# Patient Record
Sex: Male | Born: 1940 | Race: White | Hispanic: No | Marital: Married | Smoking: Never smoker
Health system: Southern US, Community
[De-identification: ages and names within clinical notes are randomized; demographics above are authoritative.]

## PROBLEM LIST (undated history)

## (undated) DIAGNOSIS — E785 Hyperlipidemia, unspecified: Secondary | ICD-10-CM

## (undated) DIAGNOSIS — M1990 Unspecified osteoarthritis, unspecified site: Secondary | ICD-10-CM

## (undated) DIAGNOSIS — I10 Essential (primary) hypertension: Secondary | ICD-10-CM

## (undated) DIAGNOSIS — G7000 Myasthenia gravis without (acute) exacerbation: Secondary | ICD-10-CM

## (undated) HISTORY — PX: APPENDECTOMY: SHX54

## (undated) HISTORY — PX: EYE SURGERY: SHX253

## (undated) HISTORY — DX: Myasthenia gravis without (acute) exacerbation: G70.00

## (undated) HISTORY — PX: HERNIA REPAIR: SHX51

---

## 2005-01-20 ENCOUNTER — Ambulatory Visit: Payer: Self-pay | Admitting: Ophthalmology

## 2005-11-07 ENCOUNTER — Other Ambulatory Visit: Payer: Self-pay

## 2005-11-13 ENCOUNTER — Ambulatory Visit: Payer: Self-pay | Admitting: General Surgery

## 2006-08-19 ENCOUNTER — Ambulatory Visit: Payer: Self-pay | Admitting: Cardiology

## 2007-08-17 ENCOUNTER — Emergency Department: Payer: Self-pay | Admitting: Emergency Medicine

## 2007-08-26 ENCOUNTER — Emergency Department: Payer: Self-pay | Admitting: Internal Medicine

## 2009-01-15 HISTORY — PX: COLONOSCOPY: SHX174

## 2009-02-01 ENCOUNTER — Ambulatory Visit: Payer: Self-pay | Admitting: General Surgery

## 2009-03-07 ENCOUNTER — Ambulatory Visit: Payer: Self-pay | Admitting: Ophthalmology

## 2009-03-19 ENCOUNTER — Ambulatory Visit: Payer: Self-pay | Admitting: Ophthalmology

## 2013-05-13 ENCOUNTER — Ambulatory Visit: Payer: Self-pay | Admitting: Ophthalmology

## 2013-05-13 LAB — CREATININE, SERUM
Creatinine: 1.03 mg/dL (ref 0.60–1.30)
EGFR (African American): >60
EGFR (Non-African Amer.): >60

## 2013-10-04 ENCOUNTER — Ambulatory Visit: Payer: Self-pay | Admitting: Family Medicine

## 2014-06-30 ENCOUNTER — Other Ambulatory Visit: Payer: Self-pay | Admitting: Neurology

## 2014-06-30 DIAGNOSIS — R519 Headache, unspecified: Secondary | ICD-10-CM

## 2014-06-30 DIAGNOSIS — R51 Headache: Principal | ICD-10-CM

## 2014-07-05 ENCOUNTER — Ambulatory Visit
Admission: RE | Admit: 2014-07-05 | Discharge: 2014-07-05 | Disposition: A | Discharge status: Home / Self Care | Payer: 59 | Source: Ambulatory Visit | Attending: Neurology | Admitting: Neurology

## 2014-07-05 DIAGNOSIS — R519 Headache, unspecified: Secondary | ICD-10-CM

## 2014-07-05 DIAGNOSIS — R51 Headache: Principal | ICD-10-CM

## 2014-07-05 MED ORDER — GADOBENATE DIMEGLUMINE 529 MG/ML IV SOLN
17.0000 mL | Freq: Once | INTRAVENOUS | Status: AC | PRN
  Administered 2014-07-05: 17 mL via INTRAVENOUS

## 2015-04-11 ENCOUNTER — Ambulatory Visit
Admit: 2015-04-11 | Disposition: A | Discharge status: Home / Self Care | Payer: Self-pay | Attending: Family Medicine | Admitting: Family Medicine

## 2015-04-14 ENCOUNTER — Ambulatory Visit
Admit: 2015-04-14 | Disposition: A | Discharge status: Home / Self Care | Payer: Self-pay | Attending: Family Medicine | Admitting: Family Medicine

## 2015-07-05 ENCOUNTER — Inpatient Hospital Stay
Admission: EM | Admit: 2015-07-05 | Discharge: 2015-07-09 | DRG: 470 | Disposition: A | Discharge status: Skilled Nursing Facility | Payer: Medicare Other | Attending: Specialist | Admitting: Specialist

## 2015-07-05 ENCOUNTER — Emergency Department: Payer: Medicare Other

## 2015-07-05 ENCOUNTER — Encounter: Payer: Self-pay | Admitting: Emergency Medicine

## 2015-07-05 DIAGNOSIS — Z96641 Presence of right artificial hip joint: Secondary | ICD-10-CM

## 2015-07-05 DIAGNOSIS — Z8249 Family history of ischemic heart disease and other diseases of the circulatory system: Secondary | ICD-10-CM | POA: Diagnosis not present

## 2015-07-05 DIAGNOSIS — Y93H3 Activity, building and construction: Secondary | ICD-10-CM | POA: Diagnosis not present

## 2015-07-05 DIAGNOSIS — Y92009 Unspecified place in unspecified non-institutional (private) residence as the place of occurrence of the external cause: Secondary | ICD-10-CM | POA: Diagnosis not present

## 2015-07-05 DIAGNOSIS — E785 Hyperlipidemia, unspecified: Secondary | ICD-10-CM | POA: Diagnosis present

## 2015-07-05 DIAGNOSIS — S92021A Displaced fracture of anterior process of right calcaneus, initial encounter for closed fracture: Secondary | ICD-10-CM | POA: Diagnosis present

## 2015-07-05 DIAGNOSIS — S92001A Unspecified fracture of right calcaneus, initial encounter for closed fracture: Secondary | ICD-10-CM

## 2015-07-05 DIAGNOSIS — G7000 Myasthenia gravis without (acute) exacerbation: Secondary | ICD-10-CM | POA: Diagnosis present

## 2015-07-05 DIAGNOSIS — Z833 Family history of diabetes mellitus: Secondary | ICD-10-CM | POA: Diagnosis not present

## 2015-07-05 DIAGNOSIS — I10 Essential (primary) hypertension: Secondary | ICD-10-CM | POA: Diagnosis present

## 2015-07-05 DIAGNOSIS — D696 Thrombocytopenia, unspecified: Secondary | ICD-10-CM | POA: Diagnosis present

## 2015-07-05 DIAGNOSIS — D62 Acute posthemorrhagic anemia: Secondary | ICD-10-CM | POA: Diagnosis not present

## 2015-07-05 DIAGNOSIS — W11XXXA Fall on and from ladder, initial encounter: Secondary | ICD-10-CM | POA: Diagnosis present

## 2015-07-05 DIAGNOSIS — S72001A Fracture of unspecified part of neck of right femur, initial encounter for closed fracture: Principal | ICD-10-CM | POA: Diagnosis present

## 2015-07-05 HISTORY — DX: Hyperlipidemia, unspecified: E78.5

## 2015-07-05 HISTORY — DX: Essential (primary) hypertension: I10

## 2015-07-05 LAB — COMPREHENSIVE METABOLIC PANEL
ALT: 16 U/L — ABNORMAL LOW (ref 17–63)
ANION GAP: 7 (ref 5–15)
AST: 26 U/L (ref 15–41)
Albumin: 3.8 g/dL (ref 3.5–5.0)
Alkaline Phosphatase: 33 U/L — ABNORMAL LOW (ref 38–126)
BILIRUBIN TOTAL: 0.5 mg/dL (ref 0.3–1.2)
BUN: 20 mg/dL (ref 6–20)
CHLORIDE: 109 mmol/L (ref 101–111)
CO2: 25 mmol/L (ref 22–32)
Calcium: 9.3 mg/dL (ref 8.9–10.3)
Creatinine, Ser: 1.17 mg/dL (ref 0.61–1.24)
GFR calc Af Amer: >60 mL/min (ref >60)
GFR calc non Af Amer: >60 mL/min (ref >60)
Glucose, Bld: 115 mg/dL — ABNORMAL HIGH (ref 65–99)
Potassium: 3.7 mmol/L (ref 3.5–5.1)
Sodium: 141 mmol/L (ref 135–145)
Total Protein: 6.2 g/dL — ABNORMAL LOW (ref 6.5–8.1)

## 2015-07-05 LAB — CBC WITH DIFFERENTIAL/PLATELET
BASOS ABS: 0.1 10*3/uL (ref 0–0.1)
Basophils Relative: 1 %
EOS ABS: 0.1 10*3/uL (ref 0–0.7)
Eosinophils Relative: 1 %
HEMATOCRIT: 37.4 % — AB (ref 40.0–52.0)
Hemoglobin: 12.7 g/dL — ABNORMAL LOW (ref 13.0–18.0)
LYMPHS ABS: 0.4 10*3/uL — AB (ref 1.0–3.6)
LYMPHS PCT: 4 %
MCH: 33.2 pg (ref 26.0–34.0)
MCHC: 34 g/dL (ref 32.0–36.0)
MCV: 97.9 fL (ref 80.0–100.0)
MONOS PCT: 6 %
Monocytes Absolute: 0.8 10*3/uL (ref 0.2–1.0)
NEUTROS ABS: 10.7 10*3/uL — AB (ref 1.4–6.5)
Neutrophils Relative %: 88 %
PLATELETS: 142 10*3/uL — AB (ref 150–440)
RBC: 3.82 MIL/uL — AB (ref 4.40–5.90)
RDW: 14.3 % (ref 11.5–14.5)
WBC: 12.1 10*3/uL — AB (ref 3.8–10.6)

## 2015-07-05 LAB — URINALYSIS COMPLETE WITH MICROSCOPIC (ARMC ONLY)
Bilirubin Urine: NEGATIVE
Glucose, UA: NEGATIVE mg/dL
Hgb urine dipstick: NEGATIVE
KETONES UR: NEGATIVE mg/dL
LEUKOCYTES UA: NEGATIVE
Nitrite: NEGATIVE
Protein, ur: NEGATIVE mg/dL
Specific Gravity, Urine: 1.016 (ref 1.005–1.030)
Squamous Epithelial / LPF: NONE SEEN
pH: 6 (ref 5.0–8.0)

## 2015-07-05 LAB — TROPONIN I: Troponin I: <0.03 ng/mL (ref <0.031)

## 2015-07-05 MED ORDER — ONDANSETRON HCL 4 MG PO TABS: 4.0000 mg | ORAL_TABLET | Freq: Four times a day (QID) | ORAL | Status: DC | PRN

## 2015-07-05 MED ORDER — MORPHINE SULFATE 4 MG/ML IJ SOLN
4.0000 mg | Freq: Once | INTRAMUSCULAR | Status: AC
  Administered 2015-07-05: 4 mg via INTRAVENOUS

## 2015-07-05 MED ORDER — ONDANSETRON HCL 4 MG/2ML IJ SOLN: 4.0000 mg | Freq: Four times a day (QID) | INTRAMUSCULAR | Status: DC | PRN

## 2015-07-05 MED ORDER — MORPHINE SULFATE 4 MG/ML IJ SOLN
INTRAMUSCULAR | Status: AC
  Administered 2015-07-05: 4 mg via INTRAVENOUS
  Filled 2015-07-05: qty 1

## 2015-07-05 MED ORDER — ONDANSETRON HCL 4 MG/2ML IJ SOLN
INTRAMUSCULAR | Status: AC
  Administered 2015-07-05: 4 mg via INTRAVENOUS
  Filled 2015-07-05: qty 2

## 2015-07-05 MED ORDER — SENNOSIDES-DOCUSATE SODIUM 8.6-50 MG PO TABS: 1.0000 | ORAL_TABLET | Freq: Every evening | ORAL | Status: DC | PRN

## 2015-07-05 MED ORDER — SODIUM CHLORIDE 0.9 % IV SOLN
INTRAVENOUS | Status: DC
  Administered 2015-07-05 – 2015-07-06 (×2): via INTRAVENOUS

## 2015-07-05 MED ORDER — LISINOPRIL 10 MG PO TABS
10.0000 mg | ORAL_TABLET | Freq: Every day | ORAL | Status: DC
  Administered 2015-07-07 – 2015-07-09 (×3): 10 mg via ORAL
  Filled 2015-07-05 (×3): qty 1

## 2015-07-05 MED ORDER — ACETAMINOPHEN 650 MG RE SUPP: 650.0000 mg | Freq: Four times a day (QID) | RECTAL | Status: DC | PRN

## 2015-07-05 MED ORDER — ONDANSETRON HCL 4 MG/2ML IJ SOLN
4.0000 mg | Freq: Once | INTRAMUSCULAR | Status: AC
  Administered 2015-07-05: 4 mg via INTRAVENOUS

## 2015-07-05 MED ORDER — ATENOLOL 50 MG PO TABS
50.0000 mg | ORAL_TABLET | Freq: Two times a day (BID) | ORAL | Status: DC
  Administered 2015-07-05 – 2015-07-09 (×7): 50 mg via ORAL
  Filled 2015-07-05 (×8): qty 1

## 2015-07-05 MED ORDER — ALBUTEROL SULFATE (2.5 MG/3ML) 0.083% IN NEBU: 2.5000 mg | INHALATION_SOLUTION | RESPIRATORY_TRACT | Status: DC | PRN

## 2015-07-05 MED ORDER — HYDROCODONE-ACETAMINOPHEN 5-325 MG PO TABS
1.0000 | ORAL_TABLET | ORAL | Status: DC | PRN
  Administered 2015-07-05: 1 via ORAL
  Administered 2015-07-06: 2 via ORAL
  Filled 2015-07-05: qty 2
  Filled 2015-07-05 (×2): qty 1

## 2015-07-05 MED ORDER — DOCUSATE SODIUM 100 MG PO CAPS
100.0000 mg | ORAL_CAPSULE | Freq: Two times a day (BID) | ORAL | Status: DC
  Administered 2015-07-05 – 2015-07-09 (×7): 100 mg via ORAL
  Filled 2015-07-05 (×7): qty 1

## 2015-07-05 MED ORDER — ACETAMINOPHEN 325 MG PO TABS: 650.0000 mg | ORAL_TABLET | Freq: Four times a day (QID) | ORAL | Status: DC | PRN

## 2015-07-05 MED ORDER — MORPHINE SULFATE 2 MG/ML IJ SOLN
2.0000 mg | INTRAMUSCULAR | Status: DC | PRN
  Administered 2015-07-05 – 2015-07-06 (×4): 2 mg via INTRAVENOUS
  Filled 2015-07-05 (×4): qty 1

## 2015-07-05 NOTE — Progress Notes (Signed)
Dr.Poggi notified stated he will do surgery in the am 07/06/2015.

## 2015-07-05 NOTE — H&P (Signed)
[facility] [place] NAME: Nicholas Monroe    MR#:  [id number]  DATE OF BIRTH:  [birth year]  DATE OF ADMISSION:  07/05/2015  PRIMARY CARE PHYSICIAN: No primary care provider on file.   REQUESTING/REFERRING PHYSICIAN: Dr. Jimmye Norman  CHIEF COMPLAINT:   Chief Complaint  Patient presents with  . Fall   fall today  HISTORY OF PRESENT ILLNESS:  Nicholas Monroe  is a 74 y.o. male with a known history of I per tension and hyperlipidemia. 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 The right side of the body hit the ground. He complains of right hip and right ankle pain. X-ray show right hip and ankle fracture with mild displacement. Patient denies any other injury. He denies any syncope or loss of consciousness.  PAST MEDICAL HISTORY:   Past Medical History  Diagnosis Date  . Hypertension   . Hyperlipidemia     PAST SURGICAL HISTORY:   Past Surgical History  Procedure Laterality Date  . Appendectomy    . Hernia repair      SOCIAL HISTORY:   History  Substance Use Topics  . Smoking status: Never Smoker   . Smokeless tobacco: Never Used  . Alcohol Use: No    FAMILY HISTORY:   Family History  Problem Relation Age of Onset  . Hypertension Father   . Diabetes Brother     DRUG ALLERGIES:  No Known Allergies  REVIEW OF SYSTEMS:  CONSTITUTIONAL: No fever, fatigue or weakness.  EYES: No blurred or double vision.  EARS, NOSE, AND THROAT: No tinnitus or ear pain.  RESPIRATORY: No cough, shortness of breath, wheezing or hemoptysis.  CARDIOVASCULAR: No chest pain, orthopnea, edema.  GASTROINTESTINAL: No nausea, vomiting, diarrhea or abdominal pain.  GENITOURINARY: No dysuria, hematuria.  ENDOCRINE: No polyuria, nocturia,  HEMATOLOGY: No anemia, easy bruising or bleeding SKIN: No rash or lesion. MUSCULOSKELETAL: No joint pain or arthritis.  Right hip and ankle pain. NEUROLOGIC: No tingling,  numbness, weakness.  PSYCHIATRY: No anxiety or depression.   MEDICATIONS AT HOME:   Prior to Admission medications   Medication Sig Start Date End Date Taking? Authorizing Provider  aspirin EC 81 MG tablet Take 81 mg by mouth daily.   Yes Historical Provider, MD  atenolol (TENORMIN) 50 MG tablet Take 50 mg by mouth 2 (two) times daily.   Yes Historical Provider, MD  fenofibrate (TRICOR) 145 MG tablet Take 72.5 mg by mouth at bedtime.   Yes Historical Provider, MD  folic acid (FOLVITE) 1 MG tablet Take 1 mg by mouth daily. Pt does not take on Saturday.   Yes Historical Provider, MD  lisinopril (PRINIVIL,ZESTRIL) 10 MG tablet Take 10 mg by mouth daily.   Yes Historical Provider, MD  methotrexate (RHEUMATREX) 2.5 MG tablet Take 10 mg by mouth once a week. Pt takes on Saturday.   Yes Historical Provider, MD  Multiple Vitamins-Minerals (CENTRUM SILVER PO) Take 1 tablet by mouth daily.   Yes Historical Provider, MD  omega-3 acid ethyl esters (LOVAZA) 1 G capsule Take 2 g by mouth 2 (two) times daily.   Yes Historical Provider, MD      VITAL SIGNS:  Blood pressure 137/72, pulse 60, resp. rate 21, height 5\' 11"  (1.803 m), weight 82.243 kg (181 lb 5 oz), SpO2 99 %.  PHYSICAL EXAMINATION:  GENERAL:  74 y.o.-year-old patient lying in the bed with no acute distress.  EYES: Pupils equal, round, reactive  to light and accommodation. No scleral icterus. Extraocular muscles intact.  HEENT: Head atraumatic, normocephalic. Oropharynx and nasopharynx clear.  NECK:  Supple, no jugular venous distention. No thyroid enlargement, no tenderness.  LUNGS: Normal breath sounds bilaterally, no wheezing, rales,rhonchi or crepitation. No use of accessory muscles of respiration.  CARDIOVASCULAR: S1, S2 normal. No murmurs, rubs, or gallops.  ABDOMEN: Soft, nontender, nondistended. Bowel sounds present. No organomegaly or mass.  EXTREMITIES: No pedal edema, cyanosis, or clubbing.  NEUROLOGIC: Cranial nerves II through  XII are intact. Muscle strength 5/5 in upper and left lower extremities. Right lower extremity on right rotation position with the right ankle swelling and tenderness. Sensation intact. Gait not checked.  PSYCHIATRIC: The patient is alert and oriented x 3.  SKIN: No obvious rash, lesion, or ulcer.   LABORATORY PANEL:   CBC  Recent Labs Lab 07/05/15 1842  WBC 12.1*  HGB 12.7*  HCT 37.4*  PLT 142*   ------------------------------------------------------------------------------------------------------------------  Chemistries  No results for input(s): NA, K, CL, CO2, GLUCOSE, BUN, CREATININE, CALCIUM, MG, AST, ALT, ALKPHOS, BILITOT in the last 168 hours.  Invalid input(s): GFRCGP ------------------------------------------------------------------------------------------------------------------  Cardiac Enzymes  Recent Labs Lab 07/05/15 1841  TROPONINI <0.03   ------------------------------------------------------------------------------------------------------------------  RADIOLOGY:  Dg Ankle Complete Right  07/05/2015   CLINICAL DATA:  Fall from ladder  EXAM: RIGHT ANKLE - COMPLETE 3+ VIEW  COMPARISON:  None.  FINDINGS: Three views of the right ankle submitted. There is mild displaced fracture mid aspect of calcaneus best seen on lateral view.  IMPRESSION: Mild displaced fracture mid aspect of calcaneus best seen on lateral view.   Electronically Signed   By: Lahoma Crocker M.D.   On: 07/05/2015 17:48   Dg Hip Unilat With Pelvis 2-3 Views Right  07/05/2015   CLINICAL DATA:  [place] off 8 foot ladder today, RIGHT hip and ankle pain, initial encounter  EXAM: DG HIP (WITH OR WITHOUT PELVIS) 2-3V RIGHT  COMPARISON:  None  FINDINGS: Osseous demineralization.  Symmetric hip and SI joints.  Mildly displaced RIGHT femoral neck fracture.  No additional fracture, dislocation or bone destruction.  IMPRESSION: Osseous demineralization.  Mildly displaced RIGHT femoral neck fracture.   Electronically  Signed   By: Lavonia Dana M.D.   On: 07/05/2015 17:55    EKG:   Orders placed or performed during the hospital encounter of 07/05/15  . ED EKG  . ED EKG    IMPRESSION AND PLAN:   Right closed hip and ankle fracture Leukocytosis Hypertension Hyperlipidemia  The patient will be admitted to medical floor. He has low risk for hip and ankle surgery. I will hold aspirin and follow-up orthopedic surgeon for possible surgery today. DVT prophylaxis after surgery and follow-up CBC in a.m. Continue hypertension medication.   All the records are reviewed and case discussed with ED provider. Management plans discussed with the patient, family and they are in agreement.  CODE STATUS: Full code  TOTAL TIME TAKING CARE OF THIS PATIENT: 48 minutes.    Demetrios Loll M.D on 07/05/2015 at 7:23 PM  Between 7am to 6pm - Pager - [phone]  After 6pm go to [email or web address] - password [email or web address] [facility]  [facility]  Office  [phone]  CC: Primary care physician; No primary care provider on file.

## 2015-07-05 NOTE — Progress Notes (Signed)
Dr. Marcille Blanco notified for PRN  IV pain medication. New orders placed. Nursing will monitor.

## 2015-07-05 NOTE — ED Notes (Signed)
Brought to ed via ems from home for evaluation s/p 8 foot fall at work. Received A&O*3 speaking full sentnences complainng of 2/10 R hip and ankle pain with movement. Denies Chest pain or SOB. Awaiting further evaluation.

## 2015-07-05 NOTE — ED Provider Notes (Signed)
[facility] Emergency Department Provider Note     Time seen: ----------------------------------------- 4:59 PM on 07/05/2015 -----------------------------------------    I have reviewed the triage vital signs and the nursing notes.   HISTORY  Chief Complaint Fall    HPI Nicholas Monroe is a 74 y.o. male who presents ER after falling about 8 feet off a ladder while at work. Patient complaining of severe right hip and right ankle pain. Patient cannot ambulate since the fall. Denies hitting his head or having loss of consciousness, denies any other complaints. States his right ankle was pointing at 90 angle.   History reviewed. No pertinent past medical history.  There are no active problems to display for this patient.   Past Surgical History  Procedure Laterality Date  . Appendectomy    . Hernia repair      Allergies Review of patient's allergies indicates no known allergies.  Social History History  Substance Use Topics  . Smoking status: Never Smoker   . Smokeless tobacco: Never Used  . Alcohol Use: No    Review of Systems Constitutional: Negative for fever. Eyes: Negative for visual changes. ENT: Negative for sore throat. Cardiovascular: Negative for chest pain. Respiratory: Negative for shortness of breath. Gastrointestinal: Negative for abdominal pain, vomiting and diarrhea. Genitourinary: Negative for dysuria. Musculoskeletal: Negative for back pain. Positive right ankle and right hip pain Skin: Negative for rash. Neurological: Negative for headaches, focal weakness or numbness.  10-point ROS otherwise negative.  ____________________________________________   PHYSICAL EXAM:  VITAL SIGNS: ED Triage Vitals  Enc Vitals Group     BP 07/05/15 1653 137/72 mmHg     Pulse Rate 07/05/15 1653 60     Resp 07/05/15 1653 21     Temp --      Temp src --      SpO2 07/05/15 1649 100 %     Weight 07/05/15 1653 181 lb 5 oz  (82.243 kg)     Height 07/05/15 1653 5\' 11"  (1.803 m)     Head Cir --      Peak Flow --      Pain Score 07/05/15 1654 2     Pain Loc --      Pain Edu? --      Excl. in [place]? --     Constitutional: Alert and oriented. Well appearing and in no distress. Eyes: Conjunctivae are normal. PERRL. Normal extraocular movements. ENT   Head: Normocephalic and atraumatic.   Nose: No congestion/rhinnorhea.   Mouth/Throat: Mucous membranes are moist.   Neck: No stridor. Cardiovascular: Normal rate, regular rhythm. Normal and symmetric distal pulses are present in all extremities. No murmurs, rubs, or gallops. Respiratory: Normal respiratory effort without tachypnea nor retractions. Breath sounds are clear and equal bilaterally. No wheezes/rales/rhonchi. Gastrointestinal: Soft and nontender. No distention. No abdominal bruits. There is no CVA tenderness. Musculoskeletal: Edema, ecchymosis and limited range motion of the right ankle. Right hip tenderness. Pain with range of motion of right hip and right ankle Neurologic:  Normal speech and language. No gross focal neurologic deficits are appreciated. Speech is normal. No gait instability. Skin:  Skin is warm, dry and intact. No rash noted. Psychiatric: Mood and affect are normal. Speech and behavior are normal. Patient exhibits appropriate insight and judgment.  ____________________________________________  ED COURSE:  Pertinent labs & imaging results that were available during my care of the patient were reviewed by me and considered in my medical decision making (see chart for details). Patient  receive right hip and right ankle x-rays. IV pain medication as needed ____________________________________________   RADIOLOGY Images were viewed by me  Right hip x-rays reveal hip fracture  IMPRESSION: Mild displaced fracture mid aspect of calcaneus best seen on lateral view. Right ankle x-rays reveal calcaneus  fracture  IMPRESSION: Osseous demineralization.  Mildly displaced RIGHT femoral neck fracture. ____________________________________________  FINAL ASSESSMENT AND PLAN  Calcaneus fracture, hip fracture  Plan: Patient with labs and imaging as dictated above. Patient will need admission, will discuss with orthopedics and hospitalist. Pain is currently under control. I discussed with Dr. Roland Rack who has agreed to see the patient and the hospitalist will be admitting. Pain is currently under control, he did get 1 dose of morphine.   Earleen Newport, MD   Earleen Newport, MD 07/05/15 [phone]

## 2015-07-06 ENCOUNTER — Inpatient Hospital Stay: Payer: Medicare Other

## 2015-07-06 ENCOUNTER — Encounter: Payer: Self-pay | Admitting: Anesthesiology

## 2015-07-06 ENCOUNTER — Inpatient Hospital Stay: Payer: Medicare Other | Admitting: Anesthesiology

## 2015-07-06 ENCOUNTER — Encounter
Admission: EM | Disposition: A | Discharge status: Skilled Nursing Facility | Payer: Self-pay | Source: Home / Self Care | Attending: Specialist

## 2015-07-06 HISTORY — PX: TOTAL HIP ARTHROPLASTY: SHX124

## 2015-07-06 LAB — CBC
HEMATOCRIT: 33.2 % — AB (ref 40.0–52.0)
Hemoglobin: 11.4 g/dL — ABNORMAL LOW (ref 13.0–18.0)
MCH: 33.2 pg (ref 26.0–34.0)
MCHC: 34.2 g/dL (ref 32.0–36.0)
MCV: 97.1 fL (ref 80.0–100.0)
Platelets: 111 10*3/uL — ABNORMAL LOW (ref 150–440)
RBC: 3.42 MIL/uL — AB (ref 4.40–5.90)
RDW: 13.5 % (ref 11.5–14.5)
WBC: 8.7 10*3/uL (ref 3.8–10.6)

## 2015-07-06 LAB — BASIC METABOLIC PANEL
Anion gap: 8 (ref 5–15)
BUN: 19 mg/dL (ref 6–20)
CALCIUM: 8.6 mg/dL — AB (ref 8.9–10.3)
CHLORIDE: 109 mmol/L (ref 101–111)
CO2: 22 mmol/L (ref 22–32)
Creatinine, Ser: 1.15 mg/dL (ref 0.61–1.24)
GFR calc Af Amer: >60 mL/min (ref >60)
GFR calc non Af Amer: >60 mL/min (ref >60)
Glucose, Bld: 118 mg/dL — ABNORMAL HIGH (ref 65–99)
Potassium: 3.9 mmol/L (ref 3.5–5.1)
SODIUM: 139 mmol/L (ref 135–145)

## 2015-07-06 LAB — TYPE AND SCREEN
ABO/RH(D): A POS
Antibody Screen: NEGATIVE

## 2015-07-06 LAB — ABO/RH: ABO/RH(D): A POS

## 2015-07-06 LAB — MRSA PCR SCREENING: MRSA by PCR: NEGATIVE

## 2015-07-06 SURGERY — ARTHROPLASTY, HIP, TOTAL,POSTERIOR APPROACH
Anesthesia: Monitor Anesthesia Care | Laterality: Right

## 2015-07-06 MED ORDER — SODIUM CHLORIDE 0.9 % IJ SOLN
INTRAMUSCULAR | Status: AC
  Filled 2015-07-06: qty 50

## 2015-07-06 MED ORDER — LACTATED RINGERS IV SOLN
INTRAVENOUS | Status: DC | PRN
  Administered 2015-07-06: 16:00:00 via INTRAVENOUS

## 2015-07-06 MED ORDER — CEFAZOLIN SODIUM-DEXTROSE 2-3 GM-% IV SOLR
2.0000 g | Freq: Four times a day (QID) | INTRAVENOUS | Status: AC
  Administered 2015-07-06 – 2015-07-07 (×3): 2 g via INTRAVENOUS
  Filled 2015-07-06 (×3): qty 50

## 2015-07-06 MED ORDER — ENOXAPARIN SODIUM 40 MG/0.4ML ~~LOC~~ SOLN
40.0000 mg | Freq: Two times a day (BID) | SUBCUTANEOUS | Status: DC
  Administered 2015-07-07 – 2015-07-08 (×4): 40 mg via SUBCUTANEOUS
  Filled 2015-07-06 (×4): qty 0.4

## 2015-07-06 MED ORDER — NEOMYCIN-POLYMYXIN B GU 40-200000 IR SOLN
Status: AC
  Filled 2015-07-06: qty 20

## 2015-07-06 MED ORDER — PROPOFOL INFUSION 10 MG/ML OPTIME
INTRAVENOUS | Status: DC | PRN
  Administered 2015-07-06: 35 ug/kg/min via INTRAVENOUS
  Administered 2015-07-06: 25 ug/kg/min via INTRAVENOUS

## 2015-07-06 MED ORDER — BUPIVACAINE HCL (PF) 0.5 % IJ SOLN
INTRAMUSCULAR | Status: DC | PRN
  Administered 2015-07-06: 3 mL via INTRATHECAL

## 2015-07-06 MED ORDER — METOCLOPRAMIDE HCL 10 MG PO TABS: 5.0000 mg | ORAL_TABLET | Freq: Three times a day (TID) | ORAL | Status: DC | PRN

## 2015-07-06 MED ORDER — OXYCODONE HCL 5 MG PO TABS
5.0000 mg | ORAL_TABLET | Freq: Once | ORAL | Status: AC | PRN
Start: 2015-07-06 — End: 2015-07-06

## 2015-07-06 MED ORDER — FENTANYL CITRATE (PF) 100 MCG/2ML IJ SOLN: 25.0000 ug | INTRAMUSCULAR | Status: DC | PRN

## 2015-07-06 MED ORDER — FENTANYL CITRATE (PF) 100 MCG/2ML IJ SOLN
INTRAMUSCULAR | Status: DC | PRN
  Administered 2015-07-06: 25 ug via INTRAVENOUS
  Administered 2015-07-06: 50 ug via INTRAVENOUS
  Administered 2015-07-06: 25 ug via INTRAVENOUS

## 2015-07-06 MED ORDER — FENOFIBRATE 160 MG PO TABS
160.0000 mg | ORAL_TABLET | Freq: Every day | ORAL | Status: DC
  Administered 2015-07-07 – 2015-07-09 (×3): 160 mg via ORAL
  Filled 2015-07-06 (×4): qty 1

## 2015-07-06 MED ORDER — OMEGA-3-ACID ETHYL ESTERS 1 G PO CAPS
2.0000 g | ORAL_CAPSULE | Freq: Two times a day (BID) | ORAL | Status: DC
  Administered 2015-07-06 – 2015-07-09 (×6): 2 g via ORAL
  Filled 2015-07-06 (×6): qty 2

## 2015-07-06 MED ORDER — KETAMINE HCL 50 MG/ML IJ SOLN
INTRAMUSCULAR | Status: DC | PRN
  Administered 2015-07-06: 25 mg via INTRAVENOUS

## 2015-07-06 MED ORDER — TRANEXAMIC ACID 1000 MG/10ML IV SOLN
INTRAVENOUS | Status: AC
  Filled 2015-07-06: qty 10

## 2015-07-06 MED ORDER — EPHEDRINE SULFATE 50 MG/ML IJ SOLN
INTRAMUSCULAR | Status: DC | PRN
  Administered 2015-07-06 (×2): 10 mg via INTRAVENOUS

## 2015-07-06 MED ORDER — BUPIVACAINE HCL (PF) 0.25 % IJ SOLN
INTRAMUSCULAR | Status: AC
  Filled 2015-07-06: qty 30

## 2015-07-06 MED ORDER — BISACODYL 10 MG RE SUPP
10.0000 mg | Freq: Every day | RECTAL | Status: DC | PRN
  Administered 2015-07-09: 10 mg via RECTAL
  Filled 2015-07-06: qty 1

## 2015-07-06 MED ORDER — OXYCODONE HCL 5 MG PO TABS
5.0000 mg | ORAL_TABLET | ORAL | Status: DC | PRN
  Administered 2015-07-06 – 2015-07-07 (×5): 5 mg via ORAL
  Administered 2015-07-07: 10 mg via ORAL
  Administered 2015-07-08 – 2015-07-09 (×5): 5 mg via ORAL
  Administered 2015-07-09: 10 mg via ORAL
  Filled 2015-07-06 (×2): qty 1
  Filled 2015-07-06: qty 2
  Filled 2015-07-06 (×8): qty 1
  Filled 2015-07-06: qty 2

## 2015-07-06 MED ORDER — CEFAZOLIN SODIUM-DEXTROSE 2-3 GM-% IV SOLR
2.0000 g | INTRAVENOUS | Status: AC
  Administered 2015-07-06: 2 g via INTRAVENOUS
  Filled 2015-07-06: qty 50

## 2015-07-06 MED ORDER — NEOMYCIN-POLYMYXIN B GU 40-200000 IR SOLN
Status: DC | PRN
  Administered 2015-07-06: 16 mL

## 2015-07-06 MED ORDER — OXYCODONE HCL 5 MG/5ML PO SOLN: 5.0000 mg | Freq: Once | ORAL | Status: AC | PRN

## 2015-07-06 MED ORDER — FLEET ENEMA 7-19 GM/118ML RE ENEM: 1.0000 | ENEMA | Freq: Once | RECTAL | Status: AC | PRN

## 2015-07-06 MED ORDER — HYDROMORPHONE HCL 1 MG/ML IJ SOLN: 0.5000 mg | INTRAMUSCULAR | Status: DC | PRN

## 2015-07-06 MED ORDER — MAGNESIUM HYDROXIDE 400 MG/5ML PO SUSP
30.0000 mL | Freq: Every day | ORAL | Status: DC | PRN
  Administered 2015-07-08 – 2015-07-09 (×2): 30 mL via ORAL
  Filled 2015-07-06 (×2): qty 30

## 2015-07-06 MED ORDER — SODIUM CHLORIDE 0.9 % IV SOLN
INTRAVENOUS | Status: DC | PRN
  Administered 2015-07-06: 60 mL

## 2015-07-06 MED ORDER — BUPIVACAINE HCL (PF) 0.25 % IJ SOLN
INTRAMUSCULAR | Status: DC | PRN
  Administered 2015-07-06: 30 mL

## 2015-07-06 MED ORDER — MIDAZOLAM HCL 5 MG/5ML IJ SOLN
INTRAMUSCULAR | Status: DC | PRN
  Administered 2015-07-06: 2 mg via INTRAVENOUS

## 2015-07-06 MED ORDER — DIPHENHYDRAMINE HCL 12.5 MG/5ML PO ELIX: 12.5000 mg | ORAL_SOLUTION | ORAL | Status: DC | PRN

## 2015-07-06 MED ORDER — TRANEXAMIC ACID 1000 MG/10ML IV SOLN
1000.0000 mg | INTRAVENOUS | Status: DC | PRN
  Administered 2015-07-06: 1000 mg via TOPICAL

## 2015-07-06 MED ORDER — LACTATED RINGERS IV SOLN
INTRAVENOUS | Status: DC | PRN
  Administered 2015-07-06: 15:00:00 via INTRAVENOUS

## 2015-07-06 MED ORDER — PANTOPRAZOLE SODIUM 40 MG PO TBEC
40.0000 mg | DELAYED_RELEASE_TABLET | Freq: Two times a day (BID) | ORAL | Status: DC
  Administered 2015-07-06 – 2015-07-09 (×6): 40 mg via ORAL
  Filled 2015-07-06 (×6): qty 1

## 2015-07-06 MED ORDER — METOCLOPRAMIDE HCL 5 MG/ML IJ SOLN
5.0000 mg | Freq: Three times a day (TID) | INTRAMUSCULAR | Status: DC | PRN
Start: 2015-07-06 — End: 2015-07-09

## 2015-07-06 MED ORDER — KCL IN DEXTROSE-NACL 20-5-0.9 MEQ/L-%-% IV SOLN
INTRAVENOUS | Status: DC
  Administered 2015-07-06: 21:00:00 via INTRAVENOUS
  Filled 2015-07-06 (×5): qty 1000

## 2015-07-06 MED ORDER — BUPIVACAINE LIPOSOME 1.3 % IJ SUSP
INTRAMUSCULAR | Status: AC
  Filled 2015-07-06: qty 20

## 2015-07-06 SURGICAL SUPPLY — 81 items
1200ML SUCTION CANISTER IMPLANT
BAG DECANTER STRL (MISCELLANEOUS) IMPLANT
BLADE SAGITTAL WIDE XTHICK NO (BLADE) ×3 IMPLANT
BLADE SURG SZ20 CARB STEEL (BLADE) ×3 IMPLANT
BNDG COHESIVE 6X5 TAN STRL LF (GAUZE/BANDAGES/DRESSINGS) ×3 IMPLANT
BOWL CEMENT MIXING ADV NOZZLE (MISCELLANEOUS) IMPLANT
CANISTER SUCT 1200ML W/VALVE (MISCELLANEOUS) ×3 IMPLANT
CANISTER SUCT 3000ML (MISCELLANEOUS) ×6 IMPLANT
CAPT HIP TOTAL 2 ×3 IMPLANT
CATH TRAY 16F METER LATEX (MISCELLANEOUS) ×3 IMPLANT
CHLORAPREP W/TINT 26ML (MISCELLANEOUS) ×9 IMPLANT
COVER MAYO STAND STRL (DRAPES) ×3 IMPLANT
DALE HOLD-A-PLACE IMPLANT
DECANTER SPIKE VIAL GLASS SM (MISCELLANEOUS) ×3 IMPLANT
DRAPE IMP U-DRAPE 54X76 (DRAPES) ×3 IMPLANT
DRAPE INCISE IOBAN 66X45 STRL (DRAPES) ×3 IMPLANT
DRAPE INCISE IOBAN 66X60 STRL (DRAPES) IMPLANT
DRAPE SHEET LG 3/4 BI-LAMINATE (DRAPES) ×3 IMPLANT
DRAPE SURG 17X23 STRL (DRAPES) ×3 IMPLANT
DRAPE TABLE BACK 80X90 (DRAPES) IMPLANT
DRESSING OPSITE IMPLANT
DRSG OPSITE POSTOP 4X10 (GAUZE/BANDAGES/DRESSINGS) IMPLANT
DRSG OPSITE POSTOP 4X12 (GAUZE/BANDAGES/DRESSINGS) IMPLANT
DRSG OPSITE POSTOP 4X14 (GAUZE/BANDAGES/DRESSINGS) IMPLANT
DRSG OPSITE POSTOP 4X8 (GAUZE/BANDAGES/DRESSINGS) ×3 IMPLANT
ELECT BLADE 6.5 EXT (BLADE) ×3 IMPLANT
ELECT CAUTERY BLADE 6.4 (BLADE) ×3 IMPLANT
GAUZE PACK 2X3YD (MISCELLANEOUS) IMPLANT
GAUZE SPONGE 4X4 12PLY STRL (GAUZE/BANDAGES/DRESSINGS) IMPLANT
GLOVE BIO SURGEON STRL SZ7.5 (GLOVE) ×15 IMPLANT
GLOVE BIO SURGEON STRL SZ8 (GLOVE) ×6 IMPLANT
GLOVE BIOGEL PI IND STRL 8 (GLOVE) ×1 IMPLANT
GLOVE BIOGEL PI INDICATOR 8 (GLOVE) ×2
GLOVE INDICATOR 8.0 STRL GRN (GLOVE) ×6 IMPLANT
GOWN STRL REUS W/ TWL LRG LVL3 (GOWN DISPOSABLE) ×2 IMPLANT
GOWN STRL REUS W/ TWL XL LVL3 (GOWN DISPOSABLE) ×1 IMPLANT
GOWN STRL REUS W/TWL LRG LVL3 (GOWN DISPOSABLE) ×4
GOWN STRL REUS W/TWL XL LVL3 (GOWN DISPOSABLE) ×2
HANDPIECE SUCTION TUBG SURGILV (MISCELLANEOUS) ×3 IMPLANT
HOLDER FOLEY CATH W/STRAP (MISCELLANEOUS) ×3 IMPLANT
HOOD PEEL AWAY FACE SHEILD DIS (HOOD) ×9 IMPLANT
IV NS 100ML SINGLE PACK (IV SOLUTION) IMPLANT
KIT RM TURNOVER STRD PROC AR (KITS) ×3 IMPLANT
NDL SAFETY 18GX1.5 (NEEDLE) ×3 IMPLANT
NEEDLE FILTER BLUNT 18X 1/2SAF (NEEDLE) ×2
NEEDLE FILTER BLUNT 18X1 1/2 (NEEDLE) ×1 IMPLANT
NEEDLE SPNL 20GX3.5 QUINCKE YW (NEEDLE) ×6 IMPLANT
NEEDLE SPNL 22GX5 LNG QUINC BK (NEEDLE) IMPLANT
NS IRRIG 1000ML POUR BTL (IV SOLUTION) ×3 IMPLANT
PACK HIP PROSTHESIS (MISCELLANEOUS) ×3 IMPLANT
PAD GROUND ADULT SPLIT (MISCELLANEOUS) ×3 IMPLANT
PILLOW ABDUC M (MISCELLANEOUS) ×3 IMPLANT
PILLOW ABDUC SM (MISCELLANEOUS) IMPLANT
PIN STEINMANN 3/16X9 BAY 6PK (Pin) ×1 IMPLANT
PRESSURIZER CEMENT PROX FEM SM (MISCELLANEOUS) IMPLANT
SOL .9 NS 3000ML IRR  AL (IV SOLUTION) ×2
SOL .9 NS 3000ML IRR UROMATIC (IV SOLUTION) ×1 IMPLANT
SPONGE LAP 18X18 5 PK (GAUZE/BANDAGES/DRESSINGS) IMPLANT
SPONGE XRAY 4X4 16PLY STRL (MISCELLANEOUS) IMPLANT
ST PIN 3/16X9 BAY 6PK (Pin) ×3 IMPLANT
STAPLER SKIN PROX 35W (STAPLE) ×3 IMPLANT
STRAP SAFETY BODY (MISCELLANEOUS) ×3 IMPLANT
SUT ETHIBOND #5 BRAIDED 30INL (SUTURE) ×3 IMPLANT
SUT ETHIBOND 2 V 37 (SUTURE) ×3 IMPLANT
SUT ETHIBOND CT1 BRD #0 30IN (SUTURE) IMPLANT
SUT ETHIBOND CT1 BRD 2-0 30IN (SUTURE) ×3 IMPLANT
SUT QUILL PDO 2 24X24 VLT (SUTURE) IMPLANT
SUT VIC AB 0 CT1 27 (SUTURE) ×4
SUT VIC AB 0 CT1 27XCR 8 STRN (SUTURE) ×2 IMPLANT
SUT VIC AB 1 CT1 36 (SUTURE) ×6 IMPLANT
SUT VIC AB 2-0 CT1 27 (SUTURE) ×8
SUT VIC AB 2-0 CT1 TAPERPNT 27 (SUTURE) ×4 IMPLANT
SYR 20CC LL (SYRINGE) ×3 IMPLANT
SYR 30ML LL (SYRINGE) ×6 IMPLANT
SYR TB 1ML 27GX1/2 LL (SYRINGE) IMPLANT
SYRINGE 10CC LL (SYRINGE) ×3 IMPLANT
TAPE MICROFOAM 4IN (TAPE) ×3 IMPLANT
TAPE TRANSPORE STRL 2 31045 (GAUZE/BANDAGES/DRESSINGS) ×3 IMPLANT
TIP COAXIAL FEMORAL CANAL (MISCELLANEOUS) IMPLANT
TUBE KAMVAC SUCTION (TUBING) ×6 IMPLANT
WATER STERILE IRR 1000ML POUR (IV SOLUTION) ×3 IMPLANT

## 2015-07-06 NOTE — Anesthesia Procedure Notes (Addendum)
Spinal Patient location during procedure: OR Staffing Anesthesiologist: ADAMS, JAMES G Resident/CRNA: LOGAN, BENJAMIN Performed by: resident/CRNA  Preanesthetic Checklist Completed: patient identified, site marked, surgical consent, pre-op evaluation, timeout performed, IV checked, risks and benefits discussed and monitors and equipment checked Spinal Block Patient position: right lateral decubitus Prep: Betadine and site prepped and draped Patient monitoring: heart rate, continuous pulse ox, blood pressure and cardiac monitor Approach: midline Location: L4-5 Injection technique: single-shot Needle Needle type: Whitacre and Introducer  Needle gauge: 24 G Needle length: 9 cm Additional Notes Negative paresthesia. Negative blood return. Positive free-flowing CSF. Expiration date of kit checked and confirmed. Patient tolerated procedure well, without complications.     

## 2015-07-06 NOTE — Consult Note (Signed)
ORTHOPAEDIC CONSULTATION  REQUESTING PHYSICIAN: Henreitta Leber, MD  Chief Complaint:   Right hip and foot pain.  History of Present Illness: Nicholas Monroe is a 74 y.o. male with a history of hypertension and mild ocular myasthenia gravis who was in his usual state of health yesterday. 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 He was on a ladder about 8 feet up when he lost his balance and fell onto his right side, injuring his right foot and his right hip. He was brought to the emergency room where x-rays demonstrated a displaced right femoral neck fracture and a calcaneus fracture. The patient denies any loss of consciousness and did not strike his head. He did sustain an abrasion to his right elbow, but denies any other injuries. He denies any lightheadedness, dizziness, chest pain, or shortness of breath that may have predisposed him to this fall. The patient denies any prior issues with either his right hip or his right foot.  Past Medical History  Diagnosis Date  . Hypertension   . Hyperlipidemia    Past Surgical History  Procedure Laterality Date  . Appendectomy    . Hernia repair     History   Social History  . Marital Status: Married    Spouse Name: N/A  . Number of Children: N/A  . Years of Education: N/A   Social History Main Topics  . Smoking status: Never Smoker   . Smokeless tobacco: Never Used  . Alcohol Use: No  . Drug Use: No  . Sexual Activity: Not on file   Other Topics Concern  . None   Social History Narrative  . None   Family History  Problem Relation Age of Onset  . Hypertension Father   . Diabetes Brother    No Known Allergies Prior to Admission medications   Medication Sig Start Date End Date Taking? Authorizing Provider  aspirin EC 81 MG tablet Take 81 mg by mouth daily.   Yes Historical Provider, MD  atenolol (TENORMIN)  50 MG tablet Take 50 mg by mouth 2 (two) times daily.   Yes Historical Provider, MD  fenofibrate (TRICOR) 145 MG tablet Take 72.5 mg by mouth at bedtime.   Yes Historical Provider, MD  folic acid (FOLVITE) 1 MG tablet Take 1 mg by mouth daily. Pt does not take on Saturday.   Yes Historical Provider, MD  lisinopril (PRINIVIL,ZESTRIL) 10 MG tablet Take 10 mg by mouth daily.   Yes Historical Provider, MD  methotrexate (RHEUMATREX) 2.5 MG tablet Take 10 mg by mouth once a week. Pt takes on Saturday.   Yes Historical Provider, MD  Multiple Vitamins-Minerals (CENTRUM SILVER PO) Take 1 tablet by mouth daily.   Yes Historical Provider, MD  omega-3 acid ethyl esters (LOVAZA) 1 G capsule Take 2 g by mouth 2 (two) times daily.   Yes Historical Provider, MD   Dg Ankle Complete Right  07/05/2015   CLINICAL DATA:  Fall from ladder  EXAM: RIGHT ANKLE - COMPLETE 3+ VIEW  COMPARISON:  None.  FINDINGS: Three views of the right ankle submitted. There is mild displaced fracture mid aspect of calcaneus best seen on lateral view.  IMPRESSION: Mild displaced fracture mid aspect of calcaneus best seen on lateral view.   Electronically Signed   By: Lahoma Crocker M.D.   On: 07/05/2015 17:48   Dg Hip Unilat With Pelvis 2-3 Views Right  07/05/2015   CLINICAL DATA:  Fell off 8 foot ladder today, RIGHT hip and ankle pain, initial encounter  EXAM: DG HIP (WITH OR WITHOUT PELVIS) 2-3V RIGHT  COMPARISON:  None  FINDINGS: Osseous demineralization.  Symmetric hip and SI joints.  Mildly displaced RIGHT femoral neck fracture.  No additional fracture, dislocation or bone destruction.  IMPRESSION: Osseous demineralization.  Mildly displaced RIGHT femoral neck fracture.   Electronically Signed   By: Lavonia Dana M.D.   On: 07/05/2015 17:55    Positive ROS: All other systems have been reviewed and were otherwise negative with the exception of those mentioned in the HPI and as above.  Physical Exam: General:  Alert, no acute  distress Psychiatric:  Patient is competent for consent with normal mood and affect   Cardiovascular:  No pedal edema Respiratory:  No wheezing, non-labored breathing GI:  Abdomen is soft and non-tender Skin:  No lesions in the area of chief complaint Neurologic:  Sensation intact distally Lymphatic:  No axillary or cervical lymphadenopathy  Orthopedic Exam:  Orthopedic examination is limited to his right hip and lower extremity. The right lower extremity is somewhat shortened and externally rotated as compared to the left. Skin inspection around the right hip is unremarkable. He has pain with any attempted active or passive motion of the hip. He also has some tenderness to palpation around the hip. Examination of his right foot demonstrates moderate swelling with ecchymosis involving the posterior heel region. The skin otherwise is intact and without any lacerations or abrasions. The foot seems to be somewhat flattened and widened as compared to the left foot. He is able to actively dorsiflex and plantarflex his toes and ankle. Sensation is intact to light touch in all distributional. He has good capillary refill to his foot, as well as a 1+ dorsalis pedis pulse.  X-rays:  Recent x-rays of the pelvis and right hip are available for review. These films demonstrate a displaced right femoral neck fracture. No significant degenerative changes are noted.  Recent x-rays of the right ankle also are available for review. These films demonstrate a displaced fracture of the calcaneal tuberosity. It is difficult to determine whether any fracture lines extend into the posterior middle facets. No lytic lesions or significant degenerative changes are noted.  Assessment: 1. Displaced right femoral neck fracture. 2. Displaced right calcaneus fracture.  Plan: The treatment options for each problem were discussed with the patient and his wife. Initial treatment will be focused on his right hip. The procedure of  a right total hip arthroplasty has been discussed, as have the potential risks (including bleeding, infection, nerve and/or blood vessel injury, persistent or recurrent pain, stiffness, leg length inequality, dislocation, loosening of and/or failure of the components, need for further surgery, blood clots, strokes, heart attacks and/or arrhythmias, etc.) and benefits. The patient states his understanding and wishes to proceed. A consent will be obtained later. Regarding his right calcaneus fracture, we will obtain a CT scan of his right heel prior to discharge. Depending on the results of this study, we will determine whether nonsurgical or surgical treatment would be most beneficial to him. Any surgical intervention for his heel would not be done until at least 10-14 days from now.  Thank you for ask me to participate in the care of this most pleasant gentleman. I will be happy to follow him with you.    Pascal Lux, MD  Beeper #:  [phone]  07/06/2015 8:20 AM

## 2015-07-06 NOTE — Op Note (Signed)
07/05/2015 - 07/06/2015  6:15 PM  Patient:   Nicholas Monroe  Pre-Op Diagnosis:   Displaced femoral neck fracture, right hip.  Post-Op Diagnosis:   Same  Procedure:   Right total hip arthroplasty.  Surgeon:   Pascal Lux, MD  Assistant:   Cameron Proud, PA-C  Anesthesia:   Spinal  Findings:   As above.  Complications:   None  EBL:   400 cc  Fluids:   1700 cc crystalloid  UOP:   350 cc  TT:   None  Drains:   None  Closure:   Staples  Implants:   Biomet press-fit system with a #13 lateral offset Echo femoral stem, a 52 mm acetabular shell with an E-poly hi-wall liner, and 36 mm ceramic head with a +6 mm neck.  Brief Clinical Note:   The patient is a 74 year old male who sustained the above-noted injury yesterday when he fell approximately 8-10 feet from a ladder, landing on his right hip and lower extremity. X-rays in the emergency room demonstrated above-noted fracture. He's been cleared medically and presents this time for definitive management of his injury.  Procedure:   The patient was brought into the operating room. After adequate spinal anesthesia was obtained, the patient was repositioned in the right lateral decubitus position and secured using a lateral hip positioner. The right hip and lower extremity were prepped with ChloroPrep solution before being draped sterilely. Preoperative antibiotics were administered. A timeout was performed to verify the appropriate surgical site before a standard posterior approach the hip was made through an approximately 4-5 inch incision. The incision was carried down through the subcutaneous tissues to expose the gluteal fascia and proximal end of the iliotibial band. These structures were split the length of the incision and the Charnley self-retaining hip retractor placed. The bursal tissues were swept posteriorly to expose the short external rotators. The anterior border of the piriformis tendon was identified and this plane  developed down through the capsule to enter the joint. Abundant fracture hematoma was suctioned. A flap of tissue was elevated off the posterior aspect of the femoral neck and greater trochanter and retracted posteriorly. This flap included the piriformis tendon, short external rotators, and the posterior capsule. Using the extramedullary guide, a femoral neck cut was made 1 fingerbreadth above the lesser trochanter. The femoral head and neck pieces were removed. The piriformis fossa was debrided of soft tissues before the intramedullary canal was accessed through this point using a triple step reamer. The canal was reamed sequentially beginning with a #7 tapered reamer and progressing to a #13 tapered reamer. This provided excellent circumferential chatter.   Attention was directed to the acetabular side. The labrum was debrided circumferentially before the ligamentum flavum was debrided using a large curette. A line was drawn on the drapes corresponding to the native version of the acetabulum. This line was used as a guide while the acetabulum was reamed sequentially beginning with a 46 mm reamer progressing to a 51 mm reamer. This provided excellent circumferential chatter. The 51 mm trial acetabulum was positioned and found to fit quite well. Therefore, the 52 mm acetabular shell was selected and impacted into place with care taken to maintain the appropriate version. The trial high wall liner was inserted.  Attention was redirected to the femoral side. A box osteotome was used to establish version before the canal was broached sequentially beginning with a #7 broach and progressing to a #13 broach. This was left in place and  several trial reductions performed using both a standard and laterally offset neck options, as well as the +0 mm, the +3 mm, and the +6 mm neck lengths. The permanent #13 laterally offset femoral stem was impacted into place. A repeat trial reduction was performed using both the +3 mm  and +6 mm neck lengths. The +6 mm neck length demonstrated excellent stability both in extension and external rotation as well as with flexion to 90 and internal rotation beyond 70. It also was stable in the position of sleep. The 36 mm ceramic head with the +6 mm neck adapter construct was put together on the back table before being impacted onto the stem of the femoral component. The Morse taper locking mechanism was verified using manual distraction before the head was relocated and placed through a range of motion with the findings as described above.  The wound was copiously irrigated with bacitracin saline solution via the jet lavage system before the peri-incisional and pericapsular tissues were injected with 30 cc of 0.5% Sensorcaine with epinephrine and 20 cc of Exparel diluted out to 60 cc with normal saline to help with postoperative analgesia. The posterior flap was reapproximated to the posterior aspect of the greater trochanter using #2 Tycron interrupted sutures placed through drill holes. Several additional #2 Tycron interrupted sutures were used to reinforce this layer of closure. The iliotibial band was reapproximated using #1 Vicryl interrupted sutures before the gluteal fascia was closed using a running #1 Vicryl suture. At this point, 1 g of transexemic acid in 10 cc of normal saline was injected into the joint to help reduce postoperative bleeding. The subcutaneous tissues were closed in several layers using 2-0 Vicryl interrupted sutures before the skin was closed using staples. A sterile occlusive dressing was applied to the wound before the patient was placed into an abduction wedge pillow. The patient was then rolled back into the supine position on her hospital bed before being awakened and returned to the recovery room in satisfactory condition after tolerating the procedure well.

## 2015-07-06 NOTE — Care Management Note (Signed)
Case Management Note  Patient Details  Name: Crist Kruszka MRN: [id number] Date of Birth: 02/21/1941  Subjective/Objective:                  Met with patient and his wife to discuss discharge planning. He would like to go home at discharge and wants [facility] for PT as his wife has used them before. He is pending surgical repair this afternoon to right hip and ankle. He believes he has a rolling walker at home he can use; wife is to check. He uses [facility] [phone].  Action/Plan: List provided for home health agencies and he picked [place]. [facility] (Jerry/Tim) notified of referral by text and fax.  Lovenox 58m #14 called in to [facility]   Expected Discharge Date:  07/09/15               Expected Discharge Plan:     In-House Referral:     Discharge planning Services  CM Consult  Post Acute Care Choice:    Choice offered to:  Patient, Spouse  DME Arranged:    DME Agency:     HH Arranged:  PT HH Agency:  [facility] Status of Service:  In process, will continue to follow  Medicare Important Message Given:    Date Medicare IM Given:    Medicare IM give by:    Date Additional Medicare IM Given:    Additional Medicare Important Message give by:     If discussed at [facility] [place] Stay Meetings, dates discussed:    Additional Comments:  AMarshell Garfinkel RN 07/06/2015, 9:53 AM

## 2015-07-06 NOTE — Transfer of Care (Signed)
Immediate Anesthesia Transfer of Care Note  Patient: Nicholas Monroe  Procedure(s) Performed: Procedure(s): TOTAL HIP ARTHROPLASTY (Right)  Patient Location: PACU  Anesthesia Type:MAC and Spinal  Level of Consciousness: awake and alert   Airway & Oxygen Therapy: Patient Spontanous Breathing and Patient connected to face mask oxygen  Post-op Assessment: Report given to RN  Post vital signs: Reviewed  Last Vitals:  Filed Vitals:   07/06/15 1805  BP: 107/73  Pulse: 61  Temp: 37.4 C  Resp: 15    Complications: No apparent anesthesia complications

## 2015-07-06 NOTE — Anesthesia Preprocedure Evaluation (Addendum)
Anesthesia Evaluation  Patient identified by MRN, date of birth, ID band Patient awake    Reviewed: Allergy & Precautions, H&P , NPO status , Patient's Chart, lab work & pertinent test results, reviewed documented beta blocker date and time   Airway Mallampati: III  TM Distance: >3 FB Neck ROM: limited    Dental  (+) Poor Dentition, Chipped, Caps   Pulmonary neg pulmonary ROS,  breath sounds clear to auscultation  Pulmonary exam normal       Cardiovascular Exercise Tolerance: Good hypertension, Normal cardiovascular examRhythm:regular Rate:Normal     Neuro/Psych negative neurological ROS  negative psych ROS   GI/Hepatic negative GI ROS, Neg liver ROS,   Endo/Other  negative endocrine ROS  Renal/GU negative Renal ROS  negative genitourinary   Musculoskeletal   Abdominal   Peds  Hematology negative hematology ROS (+)   Anesthesia Other Findings Past Medical History:   Hypertension                                                 Hyperlipidemia                                               Signs and symptoms suggestive of sleep apnea    Reproductive/Obstetrics negative OB ROS                           Anesthesia Physical Anesthesia Plan  ASA: II  Anesthesia Plan: Spinal and MAC   Post-op Pain Management:    Induction:   Airway Management Planned:   Additional Equipment:   Intra-op Plan:   Post-operative Plan:   Informed Consent: I have reviewed the patients History and Physical, chart, labs and discussed the procedure including the risks, benefits and alternatives for the proposed anesthesia with the patient or authorized representative who has indicated his/her understanding and acceptance.   Dental Advisory Given  Plan Discussed with: Anesthesiologist, CRNA and Surgeon  Anesthesia Plan Comments:         Anesthesia Quick Evaluation

## 2015-07-06 NOTE — Anesthesia Postprocedure Evaluation (Signed)
  Anesthesia Post-op Note  Patient: Nicholas Monroe  Procedure(s) Performed: Procedure(s): TOTAL HIP ARTHROPLASTY (Right)  Anesthesia type:Spinal, MAC  Patient location: PACU  Post pain: Pain level controlled  Post assessment: Post-op Vital signs reviewed, Patient's Cardiovascular Status Stable, Respiratory Function Stable, Patent Airway and No signs of Nausea or vomiting  Post vital signs: Reviewed and stable  Last Vitals:  Filed Vitals:   07/06/15 1850  BP: 117/73  Pulse: 68  Temp: 37.3 C  Resp: 19    Level of consciousness: awake, alert  and patient cooperative  Complications: No apparent anesthesia complications

## 2015-07-06 NOTE — Progress Notes (Signed)
[place] [facility] [place] NAME: Nicholas Monroe    MR#:  [id number]  DATE OF BIRTH:  [birth year]  SUBJECTIVE:  CHIEF COMPLAINT:   Chief Complaint  Patient presents with  . Fall   Patient here with a mechanical fall and noted to have a right hip and right ankle fracture. Going to the OR later today. Wife at bedside.    REVIEW OF SYSTEMS:    Review of Systems  Constitutional: Negative for fever and chills.  HENT: Negative for congestion and tinnitus.   Eyes: Negative for blurred vision and double vision.  Respiratory: Negative for cough, shortness of breath and wheezing.   Cardiovascular: Negative for chest pain, orthopnea and PND.  Gastrointestinal: Negative for nausea, vomiting, abdominal pain and diarrhea.  Genitourinary: Negative for dysuria and hematuria.  Musculoskeletal: Positive for joint pain (right ankle, right hip pain).  Neurological: Negative for dizziness, sensory change and focal weakness.  All other systems reviewed and are negative.  Diet: NPO as pt. To have surgery today.  Nutrition: NPO Tolerating PT: await eval after hip surgery.     DRUG ALLERGIES:  No Known Allergies  VITALS:  Blood pressure 115/60, pulse 55, temperature 98.7 F (37.1 C), temperature source Oral, resp. rate 18, height 5\' 11"  (1.803 m), weight 81.012 kg (178 lb 9.6 oz), SpO2 97 %.  PHYSICAL EXAMINATION:   Physical Exam  GENERAL:  74 y.o.-year-old patient lying in the bed with no acute distress.  EYES: Pupils equal, round, reactive to light and accommodation. No scleral icterus. Extraocular muscles intact.  HEENT: Head atraumatic, normocephalic. Oropharynx and nasopharynx clear.  NECK:  Supple, no jugular venous distention. No thyroid enlargement, no tenderness.  LUNGS: Normal breath sounds bilaterally, no wheezing, rales, rhonchi. No use of accessory muscles of respiration.  CARDIOVASCULAR: S1, S2 RRR. No murmurs, rubs, or gallops.  ABDOMEN:  Soft, nontender, nondistended. Bowel sounds present. No organomegaly or mass.  EXTREMITIES: No cyanosis, clubbing or edema b/l.   right leg is shortened and externally rotated due to hip fracture  NEUROLOGIC: Cranial nerves II through XII are intact. No focal Motor or sensory deficits b/l.   PSYCHIATRIC: The patient is alert and oriented x 3.  good affect  SKIN: No obvious rash, lesion, or ulcer.    LABORATORY PANEL:   CBC  Recent Labs Lab 07/06/15 0557  WBC 8.7  HGB 11.4*  HCT 33.2*  PLT 111*   ------------------------------------------------------------------------------------------------------------------  Chemistries   Recent Labs Lab 07/05/15 1842 07/06/15 0557  NA 141 139  K 3.7 3.9  CL 109 109  CO2 25 22  GLUCOSE 115* 118*  BUN 20 19  CREATININE 1.17 1.15  CALCIUM 9.3 8.6*  AST 26  --   ALT 16*  --   ALKPHOS 33*  --   BILITOT 0.5  --    ------------------------------------------------------------------------------------------------------------------  Cardiac Enzymes  Recent Labs Lab 07/05/15 1841  TROPONINI <0.03   ------------------------------------------------------------------------------------------------------------------  RADIOLOGY:  Dg Ankle Complete Right  07/05/2015   CLINICAL DATA:  Fall from ladder  EXAM: RIGHT ANKLE - COMPLETE 3+ VIEW  COMPARISON:  None.  FINDINGS: Three views of the right ankle submitted. There is mild displaced fracture mid aspect of calcaneus best seen on lateral view.  IMPRESSION: Mild displaced fracture mid aspect of calcaneus best seen on lateral view.   Electronically Signed   By: Lahoma Crocker M.D.   On: 07/05/2015 17:48   Dg Hip Unilat With Pelvis 2-3 Views Right  07/05/2015   CLINICAL DATA:  [place] off 8 foot ladder today, RIGHT hip and ankle pain, initial encounter  EXAM: DG HIP (WITH OR WITHOUT PELVIS) 2-3V RIGHT  COMPARISON:  None  FINDINGS: Osseous demineralization.  Symmetric hip and SI joints.  Mildly displaced  RIGHT femoral neck fracture.  No additional fracture, dislocation or bone destruction.  IMPRESSION: Osseous demineralization.  Mildly displaced RIGHT femoral neck fracture.   Electronically Signed   By: Lavonia Dana M.D.   On: 07/05/2015 17:55     ASSESSMENT AND PLAN:   74 yo male w/ hx of HTN, Hyperlipidemia, came into hospital due to a fall and noted to have right hip & right ankle fracture.   #1 right hip fracture-this is likely secondary to the mechanical fall. -Patient is low risk for noncardiac surgery. No absolute contraindication to surgery at this time. -Seen by orthopedics and going to the OR later today   #2 hypertension-hemodynamically stable. -Continue atenolol, lisinopril.  #3 hyperlipidemia-continue TriCor.  #4 thrombocytopenia-no evidence of acute bleeding. Will follow platelet count.     All the records are reviewed and case discussed with Care Management/Social Workerr. Management plans discussed with the patient, family and they are in agreement.  CODE STATUS: full code   DVT Prophylaxis:We'll start after surgery   TOTAL TIME TAKING CARE OF THIS PATIENT: 30 minutes.  POSSIBLE D/C IN 2-3 DAYS, DEPENDING ON CLINICAL CONDITION.   Henreitta Leber M.D on 07/06/2015 at 12:18 PM  Between 7am to 6pm - Pager - [phone]  After 6pm go to [email or web address] - password [email or web address] [facility]  [place] [facility]  Office  [phone]  CC: Primary care physician; No primary care provider on file.

## 2015-07-07 LAB — CBC WITH DIFFERENTIAL/PLATELET
BASOS ABS: 0 10*3/uL (ref 0–0.1)
BASOS PCT: 0 %
Eosinophils Absolute: 0.2 10*3/uL (ref 0–0.7)
Eosinophils Relative: 2 %
HCT: 28.6 % — ABNORMAL LOW (ref 40.0–52.0)
Hemoglobin: 9.8 g/dL — ABNORMAL LOW (ref 13.0–18.0)
LYMPHS PCT: 5 %
Lymphs Abs: 0.5 10*3/uL — ABNORMAL LOW (ref 1.0–3.6)
MCH: 33.5 pg (ref 26.0–34.0)
MCHC: 34.4 g/dL (ref 32.0–36.0)
MCV: 97.2 fL (ref 80.0–100.0)
Monocytes Absolute: 1 10*3/uL (ref 0.2–1.0)
Monocytes Relative: 11 %
Neutro Abs: 7.6 10*3/uL — ABNORMAL HIGH (ref 1.4–6.5)
Neutrophils Relative %: 82 %
Platelets: 104 10*3/uL — ABNORMAL LOW (ref 150–440)
RBC: 2.94 MIL/uL — ABNORMAL LOW (ref 4.40–5.90)
RDW: 13.9 % (ref 11.5–14.5)
WBC: 9.3 10*3/uL (ref 3.8–10.6)

## 2015-07-07 LAB — BASIC METABOLIC PANEL
ANION GAP: 7 (ref 5–15)
BUN: 17 mg/dL (ref 6–20)
CHLORIDE: 105 mmol/L (ref 101–111)
CO2: 23 mmol/L (ref 22–32)
Calcium: 8.1 mg/dL — ABNORMAL LOW (ref 8.9–10.3)
Creatinine, Ser: 0.99 mg/dL (ref 0.61–1.24)
GFR calc non Af Amer: >60 mL/min (ref >60)
Glucose, Bld: 144 mg/dL — ABNORMAL HIGH (ref 65–99)
Potassium: 3.8 mmol/L (ref 3.5–5.1)
Sodium: 135 mmol/L (ref 135–145)

## 2015-07-07 NOTE — Progress Notes (Signed)
Physical Therapy Treatment Patient Details Name: Nicholas Monroe MRN: [id number] DOB: 05-20-1941 Today's Date: 07/07/2015    History of Present Illness 74 yo male with R hip fracture now THA posterior approach and R calcaneal fracture in boot not surgically repaired yet.    PT Comments    Pt had PM visit with practice keeping wgt off RLE and pivoting on LLE with 2 person help to [facility] and to bed.  Pt is requiring help from PT to unload his RLE on PT foot, which at times requires PT to hold up R thigh to support NWB effort.  SNF discussed and pt is thinking about a location.  Follow Up Recommendations  SNF     Equipment Recommendations  Rolling walker with 5" wheels    Recommendations for Other Services OT consult     Precautions / Restrictions Precautions Precautions: Fall;Posterior Hip (telemetry) Precaution Booklet Issued: Yes (comment) Precaution Comments: Pt's wife knows the precautions Restrictions Weight Bearing Restrictions: Yes Other Position/Activity Restrictions: TTWB but cannot maintain on RLE    Mobility  Bed Mobility Overal bed mobility: Needs Assistance Bed Mobility: Supine to Sit     Supine to sit: Min assist;Mod assist        Transfers Overall transfer level: Needs assistance Equipment used: Rolling walker (2 wheeled) Transfers: Sit to/from Stand;Lateral/Scoot Transfers Sit to Stand: Mod assist;Max assist;From elevated surface (could not maintain NWB to TDWB)        Lateral/Scoot Transfers: Min assist;From elevated surface (PT set up and kept RLE NWB) General transfer comment: Pt was able to slide to chair with help for RLE only but is fearful of losing balance and pain.  Very good control with UE's mainly   Ambulation/Gait Ambulation/Gait assistance: Mod assist;+2 physical assistance;+2 safety/equipment Ambulation Distance (Feet): 8 Feet Assistive device: Rolling walker (2 wheeled);2 person hand held assist Gait Pattern/deviations: Step-to  pattern (hopping on LLE due to weakness RLE) Gait velocity: reduced Gait velocity interpretation: Below normal speed for age/gender General Gait Details: unable   Stairs            Wheelchair Mobility    Modified Rankin (Stroke Patients Only)       Balance Overall balance assessment: Needs assistance Sitting-balance support: Feet supported     Postural control: Posterior lean Standing balance support: Bilateral upper extremity supported Standing balance-Leahy Scale: Poor                      Cognition Arousal/Alertness: Awake/alert Behavior During Therapy: WFL for tasks assessed/performed Overall Cognitive Status: Within Functional Limits for tasks assessed                      Exercises      General Comments General comments (skin integrity, edema, etc.): Pt wanted to try [facility] and was able to void which was helpful due to catheter being removed.      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    Prior Function Level  of Independence: Independent          PT Goals (current goals can now be found in the care plan section) Acute Rehab PT Goals Patient Stated Goal: to go home  PT Goal Formulation: With patient/family Time For Goal Achievement: 07/21/15 Potential to Achieve Goals: Good Progress towards PT goals: Progressing toward goals     Frequency  BID    PT Plan Current plan remains appropriate    Co-evaluation             End of Session Equipment Utilized During Treatment: Gait belt Activity Tolerance: Patient tolerated treatment well;Patient limited by fatigue;Patient limited by pain Patient left: with family/visitor present;in bed;with bed alarm set     Time: 1325-1355 PT Time Calculation (min) (ACUTE ONLY): 30 min  Charges:  $Gait Training: 8-22 mins $Therapeutic Activity: 8-22 mins                    G Codes:      Ramond Dial 26-Jul-2015, 3:55 PM   Mee Hives, PT MS Acute Rehab Dept. Number: [facility] [phone] and [place] [phone]

## 2015-07-07 NOTE — Clinical Social Work Placement (Signed)
   CLINICAL SOCIAL WORK PLACEMENT  NOTE  Date:  07/07/2015  Patient Details  Name: Nicholas Monroe MRN: [id number] Date of Birth: 1941-05-03  Clinical Social Work is seeking post-discharge placement for this patient at the [place] level of care (*CSW will initial, date and re-position this form in  chart as items are completed):  Yes   Patient/family provided with [place] Work Department's list of facilities offering this level of care within the geographic area requested by the patient (or if unable, by the patient's family).  Yes   Patient/family informed of their freedom to choose among providers that offer the needed level of care, that participate in Medicare, Medicaid or managed care program needed by the patient, have an available bed and are willing to accept the patient.  Yes   Patient/family informed of [facility]'s ownership interest in [facility] and [facility], as well as of the fact that they are under no obligation to receive care at these facilities.  PASRR submitted to EDS on 07/07/15     PASRR number received on 07/07/15     Existing PASRR number confirmed on       FL2 transmitted to all facilities in geographic area requested by pt/family on 07/07/15     FL2 transmitted to all facilities within larger geographic area on       Patient informed that his/her managed care company has contracts with or will negotiate with certain facilities, including the following:            Patient/family informed of bed offers received.  Patient chooses bed at       Physician recommends and patient chooses bed at      Patient to be transferred to   on  .  Patient to be transferred to facility by       Patient family notified on   of transfer.  Name of family member notified:        PHYSICIAN Please sign FL2     Additional Comment:    _______________________________________________ Loralyn Freshwater, LCSW 07/07/2015,  4:15 PM

## 2015-07-07 NOTE — Evaluation (Signed)
Physical Therapy Evaluation Patient Details Name: Nicholas Monroe MRN: [id number] DOB: [birth year] Today's Date: 07/07/2015   History of Present Illness  74 yo male with R hip fracture now THA posterior approach and R calcaneal fracture in boot not surgically repaired yet.  Clinical Impression  Pt was able to be assisted to chair with attempted standing with walker first.  Reviewed all precautions and pt is wearing a boot on RLE to protect heel as well. Will expect him to be able to walk some what but is not able to navigate gait and stairs yet to plan for home.  SNF recommended unless he can regain more independence.    Follow Up Recommendations SNF    Equipment Recommendations  Rolling walker with 5" wheels    Recommendations for Other Services       Precautions / Restrictions Precautions Precautions: Fall;Posterior Hip (telemetry) Precaution Booklet Issued: Yes (comment) Precaution Comments: Pt's wife knows the precautions Restrictions Weight Bearing Restrictions: Yes Other Position/Activity Restrictions: TTWB but cannot maintain on RLE      Mobility  Bed Mobility Overal bed mobility: Needs Assistance Bed Mobility: Supine to Sit     Supine to sit: Min assist;Mod assist        Transfers Overall transfer level: Needs assistance Equipment used: Rolling walker (2 wheeled) Transfers: Sit to/from Stand;Lateral/Scoot Transfers Sit to Stand: Mod assist;Max assist;From elevated surface (could not maintain NWB to TDWB)        Lateral/Scoot Transfers: Min assist;From elevated surface (PT set up and kept RLE NWB) General transfer comment: Pt was able to slide to chair with help for RLE only but is fearful of losing balance and pain.  Very good control with UE's mainly   Ambulation/Gait             General Gait Details: unable  Stairs            Wheelchair Mobility    Modified Rankin (Stroke Patients Only)       Balance Overall balance assessment:  Needs assistance Sitting-balance support: Feet supported     Postural control: Posterior lean Standing balance support: Bilateral upper extremity supported Standing balance-Leahy Scale: Zero                               Pertinent Vitals/Pain Pain Assessment: Faces Faces Pain Scale: Hurts even more Pain Location: R hip Pain Descriptors / Indicators: Operative site guarding Pain Intervention(s): Limited activity within patient's tolerance;Premedicated before session;Repositioned    Home Living Family/patient expects to be discharged to:: Private residence Living Arrangements: Spouse/significant other Available Help at Discharge: Family Type of Home: House Home Access: Stairs to enter Entrance Stairs-Rails: Left Entrance Stairs-Number of Steps: 3 Home Layout: One level Home Equipment: None Additional Comments: 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    Prior Function Level of Independence: Independent               Hand Dominance   Dominant Hand: Right    Extremity/Trunk Assessment   Upper Extremity Assessment: LUE deficits/detail       LUE Deficits / Details: edema related to IV in L hand   Lower Extremity Assessment: RLE deficits/detail RLE Deficits / Details: R hip with posterior precautions and calcaneal fracture, TDWB but cannot maintain    Cervical / Trunk Assessment: Normal  Communication   Communication: No difficulties  Cognition Arousal/Alertness: Awake/alert Behavior During Therapy: [facility]  for tasks assessed/performed Overall Cognitive Status: Within Functional Limits for tasks assessed                      General Comments General comments (skin integrity, edema, etc.): Pt was able to transition from sitting only and will need to work on standing up to pivot to chair next.  Has plan to try to go home Monday but is not capable of keeping off RLE sufficient to walk not to mention need to climb  stairs.    Exercises        Assessment/Plan    PT Assessment Patient needs continued PT services  PT Diagnosis Difficulty walking   PT Problem List Decreased strength;Decreased range of motion;Decreased balance;Decreased activity tolerance;Decreased mobility;Decreased coordination;Decreased cognition;Decreased knowledge of use of DME;Decreased safety awareness;Decreased knowledge of precautions;Pain;Decreased skin integrity  PT Treatment Interventions DME instruction;Gait training;Stair training;Functional mobility training;Therapeutic activities;Therapeutic exercise;Balance training;Neuromuscular re-education;Patient/family education   PT Goals (Current goals can be found in the Care Plan section) Acute Rehab PT Goals Patient Stated Goal: to go home  PT Goal Formulation: With patient/family Time For Goal Achievement: 07/21/15 Potential to Achieve Goals: Good    Frequency BID   Barriers to discharge Inaccessible home environment      Co-evaluation               End of Session Equipment Utilized During Treatment: Gait belt Activity Tolerance: Patient tolerated treatment well;Patient limited by fatigue;Patient limited by pain Patient left: in chair;with call bell/phone within reach;with family/visitor present Nurse Communication: Mobility status;Weight bearing status         Time: 2542-7062 PT Time Calculation (min) (ACUTE ONLY): 42 min   Charges:   PT Evaluation $Initial PT Evaluation Tier I: 1 Procedure PT Treatments $Therapeutic Activity: 23-37 mins   PT G CodesRamond Dial Jul 13, 2015, 12:53 PM   Mee Hives, PT MS Acute Rehab Dept. Number: [facility] [id number] and [place] [phone]

## 2015-07-07 NOTE — Progress Notes (Signed)
   Subjective: 1 Day Post-Op Procedure(s) (LRB): TOTAL HIP ARTHROPLASTY (Right)  Fracture right calcaneous  Patient reports pain as 4 on 0-10 scale.   Patient is well, but has had some minor complaints of swelling to left arm that started yesterday. No pain. Has FROM to elbow and wrist withiout pain. We will start therapy today.  Plan is to go Home after hospital stay. no nausea and no vomiting Patient denies any chest pains or shortness of breath. Objective: Vital signs in last 24 hours: Temp:  [98.3 F (36.8 C)-100.4 F (38 C)] 99.3 F (37.4 C) (07/23 0737) Pulse Rate:  [41-74] 74 (07/23 0737) Resp:  [15-20] 18 (07/23 0737) BP: (97-145)/(54-74) 118/54 mmHg (07/23 0737) SpO2:  [92 %-98 %] 93 % (07/23 0737) FiO2 (%):  [21 %] 21 % (07/22 1939) well approximated incision Heels are non tender and elevated off the bed using rolled towels Intake/Output from previous day: 07/22 0701 - 07/23 0700 In: 1800 [I.V.:1800] Out: 1550 [Urine:1150; Blood:400] Intake/Output this shift:     Recent Labs  07/05/15 1842 07/06/15 0557 07/07/15 0453  HGB 12.7* 11.4* 9.8*    Recent Labs  07/06/15 0557 07/07/15 0453  WBC 8.7 9.3  RBC 3.42* 2.94*  HCT 33.2* 28.6*  PLT 111* 104*    Recent Labs  07/06/15 0557 07/07/15 0453  NA 139 135  K 3.9 3.8  CL 109 105  CO2 22 23  BUN 19 17  CREATININE 1.15 0.99  GLUCOSE 118* 144*  CALCIUM 8.6* 8.1*   No results for input(s): LABPT, INR in the last 72 hours.  EXAM General - Patient is Alert, Appropriate and Oriented Extremity - Neurologically intact Neurovascular intact Sensation intact distally Intact pulses distally Dressing - dressing C/D/I Motor Function - intact, moving foot and toes well on exam.   Past Medical History  Diagnosis Date  . Hypertension   . Hyperlipidemia     Assessment/Plan: 1 Day Post-Op Procedure(s) (LRB): TOTAL HIP ARTHROPLASTY (Right) Active Problems:   Closed right hip fracture  Estimated body  mass index is 24.92 kg/(m^2) as calculated from the following:   Height as of this encounter: 5\' 11"  (1.803 m).   Weight as of this encounter: 81.012 kg (178 lb 9.6 oz). Advance diet Up with therapy Discharge home with home health monday  Labs: reviewed DVT Prophylaxis - Lovenox and TED hose Toe touch wt bearing to left leg Begin working on a bowel movement     Shaquitta Burbridge R. [place] 07/07/2015, 8:11 AM

## 2015-07-07 NOTE — Clinical Social Work Note (Signed)
Clinical Social Work Assessment  Patient Details  Name: Nicholas Monroe MRN: [id number] Date of Birth: 1940/12/21  Date of referral:  07/07/15               Reason for consult:  Facility Placement                Permission sought to share information with:  Chartered certified accountant granted to share information::  Yes, Verbal Permission Granted  Name::      [place]::   [place]   Relationship::     Contact Information:     Housing/Transportation Living arrangements for the past 2 months:  [place] of Information:  Patient, Spouse Patient Interpreter Needed:  None Criminal Activity/Legal Involvement Pertinent to Current Situation/Hospitalization:  No - Comment as needed Significant Relationships:  Spouse Lives with:  Spouse Do you feel safe going back to the place where you live?  Yes Need for family participation in patient care:  Yes (Comment)  Care giving concerns:  Patient lives with his wife Nicholas Monroe in [place].    Social Worker assessment / plan: Holiday representative (CSW) received SNF consult. PT is recommending SNF. CSW met with patient and his wife Nicholas Monroe at bedside. CSW introduced self and explained role of CSW department. Patient reported that he lives with his wife Nicholas Monroe. 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 Per patient he was physically active before this hospitalization. CSW explained that PT is recomending SNF. Patient and wife are agreeable to SNF search in [place]. SNF list was provided. Patient is also interested in [facility] in [place] faxed referral to [facility].   FL2 complete and faxed out. CSW will continue to follow and assist as needed.    Employment status:  Retired Nurse, adult PT Recommendations:  [place] / Referral to community resources:  [place]  Patient/Family's Response  to care:  Patient is agreeable to [facility].   Patient/Family's Understanding of and Emotional Response to Diagnosis, Current Treatment, and Prognosis:  Patient was pleasant throughout assessment.   Emotional Assessment Appearance:    Attitude/Demeanor/Rapport:    Affect (typically observed):  Accepting, Pleasant Orientation:  Oriented to Self, Oriented to Place, Oriented to  Time, Oriented to Situation Alcohol / Substance use:  Not Applicable Psych involvement (Current and /or in the community):  No (Comment)  Discharge Needs  Concerns to be addressed:  Discharge Planning Concerns Readmission within the last 30 days:  No Current discharge risk:  None Barriers to Discharge:  Continued Medical Work up   Elwyn Reach 07/07/2015, 4:17 PM

## 2015-07-07 NOTE — Progress Notes (Signed)
[place] at [place] NAME: Nicholas Monroe    MR#:  [id number]  DATE OF BIRTH:  [birth year]  SUBJECTIVE:  CHIEF COMPLAINT:   Chief Complaint  Patient presents with  . Fall   Patient is status post right hip arthroplasty. Postop day #1. Right ankle in the boot. Still having some pain in the right foot and hip area.    REVIEW OF SYSTEMS:    Review of Systems  Constitutional: Negative for fever and chills.  HENT: Negative for congestion and tinnitus.   Eyes: Negative for blurred vision and double vision.  Respiratory: Negative for cough, shortness of breath and wheezing.   Cardiovascular: Negative for chest pain, orthopnea and PND.  Gastrointestinal: Negative for nausea, vomiting, abdominal pain and diarrhea.  Genitourinary: Negative for dysuria and hematuria.  Musculoskeletal: Positive for joint pain (right ankle, right hip pain).  Neurological: Negative for dizziness, sensory change and focal weakness.  All other systems reviewed and are negative.  Diet: Heart healthy  Nutrition: Yes  Tolerating PT: Yes  DRUG ALLERGIES:  No Known Allergies  VITALS:  Blood pressure 95/41, pulse 64, temperature 98.2 F (36.8 C), temperature source Oral, resp. rate 18, height 5\' 11"  (1.803 m), weight 81.012 kg (178 lb 9.6 oz), SpO2 92 %.  PHYSICAL EXAMINATION:   Physical Exam  GENERAL:  74 y.o.-year-old patient lying in the bed with no acute distress.  EYES: Pupils equal, round, reactive to light and accommodation. No scleral icterus. Extraocular muscles intact.  HEENT: Head atraumatic, normocephalic. Oropharynx and nasopharynx clear.  NECK:  Supple, no jugular venous distention. No thyroid enlargement, no tenderness.  LUNGS: Normal breath sounds bilaterally, no wheezing, rales, rhonchi. No use of accessory muscles of respiration.  CARDIOVASCULAR: S1, S2 RRR. No murmurs, rubs, or gallops.  ABDOMEN: Soft, nontender, nondistended. Bowel sounds  present. No organomegaly or mass.  EXTREMITIES: No cyanosis, clubbing or edema b/l. Right hip dressing from recent surgery. Right foot and ankle in a boot  NEUROLOGIC: Cranial nerves II through XII are intact. No focal Motor or sensory deficits b/l.   PSYCHIATRIC: The patient is alert and oriented x 3.  good affect  SKIN: No obvious rash, lesion, or ulcer.    LABORATORY PANEL:   CBC  Recent Labs Lab 07/07/15 0453  WBC 9.3  HGB 9.8*  HCT 28.6*  PLT 104*   ------------------------------------------------------------------------------------------------------------------  Chemistries   Recent Labs Lab 07/05/15 1842  07/07/15 0453  NA 141  < > 135  K 3.7  < > 3.8  CL 109  < > 105  CO2 25  < > 23  GLUCOSE 115*  < > 144*  BUN 20  < > 17  CREATININE 1.17  < > 0.99  CALCIUM 9.3  < > 8.1*  AST 26  --   --   ALT 16*  --   --   ALKPHOS 33*  --   --   BILITOT 0.5  --   --   < > = values in this interval not displayed. ------------------------------------------------------------------------------------------------------------------  Cardiac Enzymes  Recent Labs Lab 07/05/15 1841  TROPONINI <0.03   ------------------------------------------------------------------------------------------------------------------  RADIOLOGY:  Dg Ankle Complete Right  07/05/2015   CLINICAL DATA:  Fall from ladder  EXAM: RIGHT ANKLE - COMPLETE 3+ VIEW  COMPARISON:  None.  FINDINGS: Three views of the right ankle submitted. There is mild displaced fracture mid aspect of calcaneus best seen on lateral view.  IMPRESSION: Mild displaced fracture mid  aspect of calcaneus best seen on lateral view.   Electronically Signed   By: Lahoma Crocker M.D.   On: 07/05/2015 17:48   Dg Hip Port Unilat With Pelvis 1v Right  07/06/2015   CLINICAL DATA:  Status post right total hip replacement  EXAM: DG HIP (WITH OR WITHOUT PELVIS) 2V PORT RIGHT  COMPARISON:  Preoperative pelvis and right hip July 05, 2015  FINDINGS:  Frontal pelvis and lateral right hip images were obtained. There is a total hip prosthesis on the right with prosthetic components well-seated. No acute fracture or dislocation. Left hip joint appears unremarkable.  IMPRESSION: Right total hip prosthetic components appear well seated. No acute fracture or dislocation.   Electronically Signed   By: Lowella Grip III M.D.   On: 07/06/2015 18:57   Dg Hip Unilat With Pelvis 2-3 Views Right  07/05/2015   CLINICAL DATA:  [place] off 8 foot ladder today, RIGHT hip and ankle pain, initial encounter  EXAM: DG HIP (WITH OR WITHOUT PELVIS) 2-3V RIGHT  COMPARISON:  None  FINDINGS: Osseous demineralization.  Symmetric hip and SI joints.  Mildly displaced RIGHT femoral neck fracture.  No additional fracture, dislocation or bone destruction.  IMPRESSION: Osseous demineralization.  Mildly displaced RIGHT femoral neck fracture.   Electronically Signed   By: Lavonia Dana M.D.   On: 07/05/2015 17:55     ASSESSMENT AND PLAN:   74 yo male w/ hx of HTN, Hyperlipidemia, came into hospital due to a fall and noted to have right hip & right ankle fracture.   #1 right hip fracture-this is likely secondary to the mechanical fall. -Patient is status post right total hip arthroplasty. POD #1. -Continue pain control, care as per orthopedics. -Continue physical therapy as tolerated and patient will likely need short-term rehabilitation upon discharge.  #2 hypertension-hemodynamically stable. -Continue atenolol, lisinopril.  #3 hyperlipidemia-continue TriCor.  #4 thrombocytopenia-no evidence of acute bleeding.  -Follow serial counts.  #5 right ankle fracture-patient's right ankle is in a boot. He is nonweightbearing on the right foot. -Continue pain control and care as per orthopedics.   All the records are reviewed and case discussed with Care Management/Social Workerr. Management plans discussed with the patient, family and they are in agreement.  CODE STATUS: full  code   DVT Prophylaxis: Lovenox  TOTAL TIME TAKING CARE OF THIS PATIENT: 25 minutes.  POSSIBLE D/C IN 2-3 DAYS, DEPENDING ON CLINICAL CONDITION.   Henreitta Leber M.D on 07/07/2015 at 2:20 PM  Between 7am to 6pm - Pager - [phone]  After 6pm go to [email or web address] - password [email or web address] [facility]  [place] [facility]  Office  [phone]  CC: Primary care physician; No primary care provider on file.

## 2015-07-07 NOTE — Progress Notes (Signed)
Pt complained of arm being swollen at beginning of shift when wife was visiting. Unable to put on his watch and arm was slightly painful per patient. Wife agreed to take watch home.  This morning, pt again complained that arm continues to feel swollen, arm swollen even more. IV site removed from that extremity and elevated with two pillows for comfort. Pt stated he wondered if he hurt it in the fall and should get an xray. Will pass on to day shift nurse to ask rounding MD.

## 2015-07-07 NOTE — Progress Notes (Signed)
Patient VSS this shift. Up with PT & sat in chair 3+hours. Patient voiding following d/c of foley. Pain being controlled with oral pain meds. Continue to monitor.

## 2015-07-08 ENCOUNTER — Inpatient Hospital Stay: Payer: Medicare Other

## 2015-07-08 LAB — BASIC METABOLIC PANEL
ANION GAP: 5 (ref 5–15)
BUN: 14 mg/dL (ref 6–20)
CHLORIDE: 104 mmol/L (ref 101–111)
CO2: 24 mmol/L (ref 22–32)
Calcium: 8.1 mg/dL — ABNORMAL LOW (ref 8.9–10.3)
Creatinine, Ser: 1.02 mg/dL (ref 0.61–1.24)
GFR calc non Af Amer: >60 mL/min (ref >60)
GLUCOSE: 117 mg/dL — AB (ref 65–99)
POTASSIUM: 3.6 mmol/L (ref 3.5–5.1)
Sodium: 133 mmol/L — ABNORMAL LOW (ref 135–145)

## 2015-07-08 MED ORDER — FLEET ENEMA 7-19 GM/118ML RE ENEM: 1.0000 | ENEMA | Freq: Every day | RECTAL | Status: DC | PRN

## 2015-07-08 NOTE — Progress Notes (Signed)
Physical Therapy Treatment Patient Details Name: Nicholas Monroe MRN: [id number] DOB: 1941-08-04 Today's Date: 07/08/2015    History of Present Illness 74 yo male with R hip fracture now THA posterior approach and R calcaneal fracture in boot not surgically repaired yet.    PT Comments    Pt is getting up to walk with better control today and is also having imaging of R heel.  Pt more controlled with standing on LLE and still needs close monitoring of WBing on RLE.  He is tending to keep off x transitions to stand and sit and when distracted from the task  Follow Up Recommendations  SNF     Equipment Recommendations  Rolling walker with 5" wheels    Recommendations for Other Services OT consult     Precautions / Restrictions Precautions Precautions: Fall;Posterior Hip Precaution Booklet Issued: Yes (comment) Precaution Comments: Pt's wife knows the precautions Restrictions Weight Bearing Restrictions: Yes Other Position/Activity Restrictions: TTWB but cannot maintain on RLE    Mobility  Bed Mobility Overal bed mobility: Needs Assistance Bed Mobility: Supine to Sit;Sit to Supine     Supine to sit: Min assist;HOB elevated (trapeze bar) Sit to supine: Min assist (trapeze and help mainly for LE's)      Transfers Overall transfer level: Needs assistance Equipment used: Rolling walker (2 wheeled) Transfers: Sit to/from Stand Sit to Stand: Min assist;Mod assist (Had Foot under his RLE to monitor [place])         General transfer comment: much better control on RLE todya and to stand with just LLE, with less controlling of wbing by PT  Ambulation/Gait Ambulation/Gait assistance: Min assist;+2 physical assistance;+2 safety/equipment Ambulation Distance (Feet): 8 Feet Assistive device: Rolling walker (2 wheeled);2 person hand held assist Gait Pattern/deviations: Shuffle Gait velocity: reduced Gait velocity interpretation: Below normal speed for age/gender General  Gait Details: Pt slid on LLE with walker and PT had RLE on her foot to note wbing and correct if pt tried to lean on it.  All work bedside to    [employer] Rankin (Stroke Patients Only)       Balance             Standing balance-Leahy Scale: Fair (on walker and poor without)                      Cognition Arousal/Alertness: Awake/alert Behavior During Therapy: WFL for tasks assessed/performed Overall Cognitive Status: Within Functional Limits for tasks assessed                      Exercises      General Comments General comments (skin integrity, edema, etc.): sidestepping up bed today due to MD asking to finish tx quickly and get to CT scan of R heel.  Decision is being made about further surgical intervention for R calcaneal fx.      Pertinent Vitals/Pain Pain Assessment: Faces Pain Score: 4  Faces Pain Scale: Hurts little more Pain Location: R hip Pain Descriptors / Indicators: Operative site guarding Pain Intervention(s): Monitored during session;Premedicated before session;Repositioned    Home Living Family/patient expects to be discharged to:: Private residence Living Arrangements: Spouse/significant other Available Help at Discharge: Family Type of Home: House Home Access: Stairs to enter            Prior Function  PT Goals (current goals can now be found in the care plan section) Progress towards PT goals: Progressing toward goals    Frequency  BID    PT Plan Current plan remains appropriate    Co-evaluation             End of Session Equipment Utilized During Treatment: Gait belt Activity Tolerance: Patient tolerated treatment well;Patient limited by fatigue;Patient limited by pain Patient left: with family/visitor present;in bed;with bed alarm set     Time: 2751-7001 PT Time Calculation (min) (ACUTE ONLY): 24 min  Charges:  $Gait Training: 8-22  mins $Therapeutic Activity: 8-22 mins                    G Codes:      Ramond Dial 07/25/2015, 12:57 PM   Mee Hives, PT MS Acute Rehab Dept. Number: [facility] [id number] and [place] [phone]

## 2015-07-08 NOTE — Progress Notes (Signed)
Pt has limited ability to stand due to non weight barring  to rt foot due to fracture of heel , pain controlled with po meds,  Wife at bedside ,  Pt prefers no ted hose to rt foot due fx

## 2015-07-08 NOTE — Progress Notes (Signed)
   Subjective: 2 Days Post-Op Procedure(s) (LRB): TOTAL HIP ARTHROPLASTY (Right) Patient reports pain as moderate.   Patient is well, and has had no acute complaints or problems We will start therapy today.  Plan is to go Rehab after hospital stay. no nausea and no vomiting Patient denies any chest pains or shortness of breath. Objective: Vital signs in last 24 hours: Temp:  [98.1 F (36.7 C)-99.8 F (37.7 C)] 98.1 F (36.7 C) (07/24 0720) Pulse Rate:  [64-75] 67 (07/24 0720) Resp:  [18] 18 (07/24 0720) BP: (95-131)/(41-81) 112/76 mmHg (07/24 0720) SpO2:  [90 %-98 %] 94 % (07/24 0720) well approximated incision Heels are non tender and elevated off the bed using rolled towels Intake/Output from previous day: 07/23 0701 - 07/24 0700 In: 720 [P.O.:720] Out: 1225 [Urine:1225] Intake/Output this shift:     Recent Labs  07/05/15 1842 07/06/15 0557 07/07/15 0453  HGB 12.7* 11.4* 9.8*    Recent Labs  07/06/15 0557 07/07/15 0453  WBC 8.7 9.3  RBC 3.42* 2.94*  HCT 33.2* 28.6*  PLT 111* 104*    Recent Labs  07/07/15 0453 07/08/15 0409  NA 135 133*  K 3.8 3.6  CL 105 104  CO2 23 24  BUN 17 14  CREATININE 0.99 1.02  GLUCOSE 144* 117*  CALCIUM 8.1* 8.1*   No results for input(s): LABPT, INR in the last 72 hours.  EXAM General - Patient is Alert, Appropriate and Oriented Extremity - Neurologically intact Neurovascular intact Sensation intact distally Intact pulses distally Dorsiflexion/Plantar flexion intact Dressing - scant drainage Motor Function - intact, moving foot and toes well on exam.   Past Medical History  Diagnosis Date  . Hypertension   . Hyperlipidemia     Assessment/Plan: 2 Days Post-Op Procedure(s) (LRB): TOTAL HIP ARTHROPLASTY (Right) Active Problems:   Closed right hip fracture  Estimated body mass index is 24.92 kg/(m^2) as calculated from the following:   Height as of this encounter: 5\' 11"  (1.803 m).   Weight as of this  encounter: 81.012 kg (178 lb 9.6 oz). Up with therapy Plan for discharge tomorrow Discharge to SNF  Labs: reviewed DVT Prophylaxis - Lovenox, Foot Pumps and TED hose Toe touch down for balance only to right leg Needs a bowel movement today Dressing changed    Alix Lahmann R. [place] 07/08/2015, 7:38 AM

## 2015-07-08 NOTE — Progress Notes (Signed)
[facility] [place] NAME: Nicholas Monroe    MR#:  [id number]  DATE OF BIRTH:  1941-06-08  SUBJECTIVE:  CHIEF COMPLAINT:   Chief Complaint  Patient presents with  . Fall   Patient is status post right hip arthroplasty. Postop day #2. Right ankle in the boot.  Working well with PT.  No complaints.     REVIEW OF SYSTEMS:    Review of Systems  Constitutional: Negative for fever and chills.  HENT: Negative for congestion and tinnitus.   Eyes: Negative for blurred vision and double vision.  Respiratory: Negative for cough, shortness of breath and wheezing.   Cardiovascular: Negative for chest pain, orthopnea and PND.  Gastrointestinal: Negative for nausea, vomiting, abdominal pain and diarrhea.  Genitourinary: Negative for dysuria and hematuria.  Musculoskeletal: Positive for joint pain (right ankle, right hip pain).  Neurological: Negative for dizziness, sensory change and focal weakness.  All other systems reviewed and are negative.  Diet: Heart healthy  Nutrition: Yes  Tolerating PT: Yes  DRUG ALLERGIES:  No Known Allergies  VITALS:  Blood pressure 135/65, pulse 71, temperature 99 F (37.2 C), temperature source Oral, resp. rate 18, height 5\' 11"  (1.803 m), weight 81.012 kg (178 lb 9.6 oz), SpO2 98 %.  PHYSICAL EXAMINATION:   Physical Exam  GENERAL:  74 y.o.-year-old patient lying in the bed with no acute distress.  EYES: Pupils equal, round, reactive to light and accommodation. No scleral icterus. Extraocular muscles intact.  HEENT: Head atraumatic, normocephalic. Oropharynx and nasopharynx clear.  NECK:  Supple, no jugular venous distention. No thyroid enlargement, no tenderness.  LUNGS: Normal breath sounds bilaterally, no wheezing, rales, rhonchi. No use of accessory muscles of respiration.  CARDIOVASCULAR: S1, S2 RRR. No murmurs, rubs, or gallops.  ABDOMEN: Soft, nontender, nondistended. Bowel sounds present. No  organomegaly or mass.  EXTREMITIES: No cyanosis, clubbing or edema b/l. Right hip dressing from recent surgery. Right foot and ankle in a boot  NEUROLOGIC: Cranial nerves II through XII are intact. No focal Motor or sensory deficits b/l.   PSYCHIATRIC: The patient is alert and oriented x 3.  good affect  SKIN: No obvious rash, lesion, or ulcer.    LABORATORY PANEL:   CBC  Recent Labs Lab 07/07/15 0453  WBC 9.3  HGB 9.8*  HCT 28.6*  PLT 104*   ------------------------------------------------------------------------------------------------------------------  Chemistries   Recent Labs Lab 07/05/15 1842  07/08/15 0409  NA 141  < > 133*  K 3.7  < > 3.6  CL 109  < > 104  CO2 25  < > 24  GLUCOSE 115*  < > 117*  BUN 20  < > 14  CREATININE 1.17  < > 1.02  CALCIUM 9.3  < > 8.1*  AST 26  --   --   ALT 16*  --   --   ALKPHOS 33*  --   --   BILITOT 0.5  --   --   < > = values in this interval not displayed. ------------------------------------------------------------------------------------------------------------------  Cardiac Enzymes  Recent Labs Lab 07/05/15 1841  TROPONINI <0.03   ------------------------------------------------------------------------------------------------------------------  RADIOLOGY:  Dg Hip Port Unilat With Pelvis 1v Right  07/06/2015   CLINICAL DATA:  Status post right total hip replacement  EXAM: DG HIP (WITH OR WITHOUT PELVIS) 2V PORT RIGHT  COMPARISON:  Preoperative pelvis and right hip July 05, 2015  FINDINGS: Frontal pelvis and lateral right hip images were obtained. There is a  total hip prosthesis on the right with prosthetic components well-seated. No acute fracture or dislocation. Left hip joint appears unremarkable.  IMPRESSION: Right total hip prosthetic components appear well seated. No acute fracture or dislocation.   Electronically Signed   By: Lowella Grip III M.D.   On: 07/06/2015 18:57     ASSESSMENT AND PLAN:   74 yo  male w/ hx of HTN, Hyperlipidemia, came into hospital due to a fall and noted to have right hip & right ankle fracture.   #1 right hip fracture-this is likely secondary to the mechanical fall. -Patient is status post right total hip arthroplasty. POD #2. -Continue pain control, care as per orthopedics. Foley d/c & needs to have a BM today.  -Continue physical therapy as tolerated and likely d/c to SNF tomorrow.    #2 hypertension-hemodynamically stable. -Continue atenolol, lisinopril.  #3 hyperlipidemia-continue TriCor.  #4 thrombocytopenia-no evidence of acute bleeding.  -Follow serial counts.  #5 right ankle fracture-patient's right ankle is in a boot. He is nonweightbearing on the right foot. -Continue pain control and care as per orthopedics.   All the records are reviewed and case discussed with Care Management/Social Workerr. Management plans discussed with the patient, family and they are in agreement.  CODE STATUS: full code   DVT Prophylaxis: Lovenox  TOTAL TIME TAKING CARE OF THIS PATIENT: 25 minutes.  POSSIBLE D/C tomorrow a.m. To SNF.     Henreitta Leber M.D on 07/08/2015 at 11:45 AM  Between 7am to 6pm - Pager - [phone]  After 6pm go to [email or web address] - password [email or web address] [facility]  [facility]  Office  [phone]  CC: Primary care physician; No primary care provider on file.

## 2015-07-09 ENCOUNTER — Encounter: Payer: Self-pay | Admitting: Surgery

## 2015-07-09 ENCOUNTER — Encounter
Admission: RE | Admit: 2015-07-09 | Discharge: 2015-07-09 | Disposition: A | Discharge status: Home / Self Care | Payer: Medicare Other | Source: Ambulatory Visit | Attending: Internal Medicine | Admitting: Internal Medicine

## 2015-07-09 DIAGNOSIS — D649 Anemia, unspecified: Secondary | ICD-10-CM | POA: Insufficient documentation

## 2015-07-09 LAB — BASIC METABOLIC PANEL
Anion gap: 6 (ref 5–15)
BUN: 15 mg/dL (ref 6–20)
CALCIUM: 8 mg/dL — AB (ref 8.9–10.3)
CHLORIDE: 104 mmol/L (ref 101–111)
CO2: 24 mmol/L (ref 22–32)
Creatinine, Ser: 1.18 mg/dL (ref 0.61–1.24)
GFR calc Af Amer: >60 mL/min (ref >60)
GFR calc non Af Amer: 59 mL/min — ABNORMAL LOW (ref >60)
Glucose, Bld: 125 mg/dL — ABNORMAL HIGH (ref 65–99)
POTASSIUM: 3.7 mmol/L (ref 3.5–5.1)
SODIUM: 134 mmol/L — AB (ref 135–145)

## 2015-07-09 LAB — CBC WITH DIFFERENTIAL/PLATELET
BASOS ABS: 0 10*3/uL (ref 0–0.1)
Basophils Relative: 0 %
EOS ABS: 0.4 10*3/uL (ref 0–0.7)
Eosinophils Relative: 7 %
HCT: 21.9 % — ABNORMAL LOW (ref 40.0–52.0)
HEMOGLOBIN: 7.5 g/dL — AB (ref 13.0–18.0)
LYMPHS ABS: 0.6 10*3/uL — AB (ref 1.0–3.6)
Lymphocytes Relative: 11 %
MCH: 32.8 pg (ref 26.0–34.0)
MCHC: 34.1 g/dL (ref 32.0–36.0)
MCV: 96.1 fL (ref 80.0–100.0)
MONO ABS: 0.6 10*3/uL (ref 0.2–1.0)
Monocytes Relative: 13 %
Neutro Abs: 3.6 10*3/uL (ref 1.4–6.5)
PLATELETS: 100 10*3/uL — AB (ref 150–440)
RBC: 2.28 MIL/uL — AB (ref 4.40–5.90)
RDW: 13.4 % (ref 11.5–14.5)
WBC: 5.2 10*3/uL (ref 3.8–10.6)

## 2015-07-09 MED ORDER — DOCUSATE SODIUM 100 MG PO CAPS: 100.0000 mg | ORAL_CAPSULE | Freq: Two times a day (BID) | ORAL | Status: DC

## 2015-07-09 MED ORDER — OXYCODONE HCL 5 MG PO TABS: 5.0000 mg | ORAL_TABLET | ORAL | Status: DC | PRN

## 2015-07-09 MED ORDER — ENOXAPARIN SODIUM 40 MG/0.4ML ~~LOC~~ SOLN
40.0000 mg | SUBCUTANEOUS | Status: DC
Start: 2015-07-09 — End: 2015-09-27

## 2015-07-09 NOTE — Progress Notes (Signed)
Hemoglobin 7.5. Dr. Roland Rack made aware. No new orders.

## 2015-07-09 NOTE — Progress Notes (Signed)
Physical Therapy Treatment Patient Details Name: Nicholas Monroe MRN: [id number] DOB: 1941/09/11 Today's Date: 07/09/2015    History of Present Illness 74 yo male with R hip fracture now THA posterior approach and R calcaneal fracture in boot not surgically repaired yet.    PT Comments    Pt and family educated on exercises, transfers, bed mobility and safety. Pt's and family questions answered to their satisfaction. Pt to be discharged to [facility] today.    Follow Up Recommendations  SNF     Equipment Recommendations  Rolling walker with 5" wheels    Recommendations for Other Services       Precautions / Restrictions Precautions Precautions: Fall;Posterior Hip Restrictions Weight Bearing Restrictions: Yes Other Position/Activity Restrictions: TTWB but cannot maintain on RLE    Mobility  Bed Mobility Overal bed mobility: Needs Assistance Bed Mobility: Supine to Sit     Supine to sit: Min assist Sit to supine: Min assist   General bed mobility comments: Assist for BLEs  Transfers Overall transfer level: Needs assistance Equipment used: Rolling walker (2 wheeled) Transfers: Sit to/from Stand Sit to Stand: Min assist         General transfer comment: Pivots  Ambulation/Gait Ambulation/Gait assistance: Min assist Ambulation Distance (Feet): 5 Feet Assistive device: Rolling walker (2 wheeled);2 person hand held assist Gait Pattern/deviations:  (Pivots on L)         Stairs            Wheelchair Mobility    Modified Rankin (Stroke Patients Only)       Balance Overall balance assessment: Needs assistance         Standing balance support: Bilateral upper extremity supported Standing balance-Leahy Scale: Fair                      Cognition Arousal/Alertness: Awake/alert Behavior During Therapy: WFL for tasks assessed/performed Overall Cognitive Status: Within Functional Limits for tasks assessed                       Exercises      General Comments        Pertinent Vitals/Pain Pain Score: 0-No pain Faces Pain Scale: Hurts little more Pain Location: R hip Pain Intervention(s): Limited activity within patient's tolerance;Monitored during session    Home Living                      Prior Function            PT Goals (current goals can now be found in the care plan section) Progress towards PT goals: Progressing toward goals    Frequency  BID    PT Plan Current plan remains appropriate    Co-evaluation             End of Session Equipment Utilized During Treatment: Gait belt Activity Tolerance: Patient tolerated treatment well       Time: 0940-7680 PT Time Calculation (min) (ACUTE ONLY): 44 min  Charges:  $Gait Training: 8-22 mins $Therapeutic Exercise: 8-22 mins $Therapeutic Activity: 8-22 mins                    G Codes:      Charlaine Dalton 07/09/2015, 10:52 AM

## 2015-07-09 NOTE — Discharge Summary (Signed)
[facility] at [place] NAME: Nicholas Monroe    MR#:  [id number]  DATE OF BIRTH:  [birth year]  DATE OF ADMISSION:  07/05/2015 ADMITTING PHYSICIAN: Demetrios Loll, MD  DATE OF DISCHARGE: 07/09/2015  PRIMARY CARE PHYSICIAN: Otilio Miu, MD    ADMISSION DIAGNOSIS:  Hip fracture, right, closed, initial encounter [S72.001A] Calcaneal fracture, right, closed, initial encounter [S92.001A]  DISCHARGE DIAGNOSIS:  Active Problems:   Closed right hip fracture   SECONDARY DIAGNOSIS:   Past Medical History  Diagnosis Date  . Hypertension   . Hyperlipidemia     HOSPITAL COURSE:   74 yo male w/ hx of HTN, Hyperlipidemia, came into hospital due to a fall and noted to have right hip & right ankle fracture.   #1 right hip fracture-this is likely secondary to the mechanical fall. -Patient is status post right total hip arthroplasty. POD #3. -Patient has clinically done well and his pain is well-controlled now. He is tolerating physical therapy well. She being discharged to a skilled nursing facility for ongoing care and rehabilitation.  #2 hypertension-remained hemodynamically stable while in hospital. -Continue atenolol, lisinopril.  #3 hyperlipidemia-continue TriCor.  #4 thrombocytopenia-no evidence of acute bleeding.  -Continue to follow serial counts and can be referred to hematology as outpatient.  #5 right ankle fracture-patient's right ankle is in a boot. He is nonweightbearing on the right foot. -Continue pain control and care as per orthopedics and patient to follow-up with them in 2 weeks after discharge.  #6 anemia-this is likely secondary to the blood loss during surgery. Clinically patient is asymptomatic. -Hemoglobin is 7.5 on the day of discharge and patient should have a repeat hemoglobin the next 2-3 days. No need for urgent transfusion presently   DISCHARGE CONDITIONS:   Stable  CONSULTS OBTAINED:  Treatment Team:  Corky Mull, MD  DRUG ALLERGIES:  No Known Allergies  DISCHARGE MEDICATIONS:   Current Discharge Medication List    START taking these medications   Details  docusate sodium (COLACE) 100 MG capsule Take 1 capsule (100 mg total) by mouth 2 (two) times daily. Qty: 10 capsule, Refills: 0    enoxaparin (LOVENOX) 40 MG/0.4ML injection Inject 0.4 mLs (40 mg total) into the skin daily. Qty: 14 Syringe, Refills: 0    oxyCODONE (OXY IR/ROXICODONE) 5 MG immediate release tablet Take 1-2 tablets (5-10 mg total) by mouth every 4 (four) hours as needed for breakthrough pain. Qty: 60 tablet, Refills: 0      CONTINUE these medications which have NOT CHANGED   Details  aspirin EC 81 MG tablet Take 81 mg by mouth daily.    atenolol (TENORMIN) 50 MG tablet Take 50 mg by mouth 2 (two) times daily.    fenofibrate (TRICOR) 145 MG tablet Take 72.5 mg by mouth at bedtime.    folic acid (FOLVITE) 1 MG tablet Take 1 mg by mouth daily. Pt does not take on Saturday.    lisinopril (PRINIVIL,ZESTRIL) 10 MG tablet Take 10 mg by mouth daily.    methotrexate (RHEUMATREX) 2.5 MG tablet Take 10 mg by mouth once a week. Pt takes on Saturday.    Multiple Vitamins-Minerals (CENTRUM SILVER PO) Take 1 tablet by mouth daily.    omega-3 acid ethyl esters (LOVAZA) 1 G capsule Take 2 g by mouth 2 (two) times daily.         DISCHARGE INSTRUCTIONS:   DIET:  Cardiac diet  DISCHARGE CONDITION:  Stable  ACTIVITY:  Activity as  tolerated  OXYGEN:  Home Oxygen: No.   Oxygen Delivery: room air  DISCHARGE LOCATION:  nursing home   If you experience worsening of your admission symptoms, develop shortness of breath, life threatening emergency, suicidal or homicidal thoughts you must seek medical attention immediately by calling 911 or calling your MD immediately  if symptoms less severe.  You Must read complete instructions/literature along with all the possible adverse reactions/side effects for all the  Medicines you take and that have been prescribed to you. Take any new Medicines after you have completely understood and accpet all the possible adverse reactions/side effects.   Please note  You were cared for by a hospitalist during your hospital stay. If you have any questions about your discharge medications or the care you received while you were in the hospital after you are discharged, you can call the unit and asked to speak with the hospitalist on call if the hospitalist that took care of you is not available. Once you are discharged, your primary care physician will handle any further medical issues. Please note that NO REFILLS for any discharge medications will be authorized once you are discharged, as it is imperative that you return to your primary care physician (or establish a relationship with a primary care physician if you do not have one) for your aftercare needs so that they can reassess your need for medications and monitor your lab values.     Today   Clinically doing well. Tolerating physical therapy well. No chest pain, shortness of breath, nausea, vomiting. Needs to have a BM today prior to Discharge  VITAL SIGNS:  Blood pressure 118/62, pulse 70, temperature 99 F (37.2 C), temperature source Oral, resp. rate 18, height 5\' 11"  (1.803 m), weight 81.012 kg (178 lb 9.6 oz), SpO2 96 %.  I/O:    Intake/Output Summary (Last 24 hours) at 07/09/15 0841 Last data filed at 07/09/15 0603  Gross per 24 hour  Intake    780 ml  Output   3475 ml  Net  -2695 ml    PHYSICAL EXAMINATION:  GENERAL:  74 y.o.-year-old patient lying in the bed with no acute distress.  EYES: Pupils equal, round, reactive to light and accommodation. No scleral icterus. Extraocular muscles intact.  HEENT: Head atraumatic, normocephalic. Oropharynx and nasopharynx clear.  NECK:  Supple, no jugular venous distention. No thyroid enlargement, no tenderness.  LUNGS: Normal breath sounds bilaterally, no  wheezing, rales,rhonchi. No use of accessory muscles of respiration.  CARDIOVASCULAR: S1, S2 normal. No murmurs, rubs, or gallops.  ABDOMEN: Soft, non-tender, non-distended. Bowel sounds present. No organomegaly or mass.  EXTREMITIES: No pedal edema, cyanosis, or clubbing. Right hip dressing with no acute drainage.  Right ankle is slightly bruised.   NEUROLOGIC: Cranial nerves II through XII are intact. No focal motor or sensory defecits b/l.  PSYCHIATRIC: The patient is alert and oriented x 3. Good affect.  SKIN: No obvious rash, lesion, or ulcer.   DATA REVIEW:   CBC  Recent Labs Lab 07/09/15 0427  WBC 5.2  HGB 7.5*  HCT 21.9*  PLT 100*    Chemistries   Recent Labs Lab 07/05/15 1842  07/09/15 0427  NA 141  < > 134*  K 3.7  < > 3.7  CL 109  < > 104  CO2 25  < > 24  GLUCOSE 115*  < > 125*  BUN 20  < > 15  CREATININE 1.17  < > 1.18  CALCIUM 9.3  < >  8.0*  AST 26  --   --   ALT 16*  --   --   ALKPHOS 33*  --   --   BILITOT 0.5  --   --   < > = values in this interval not displayed.  Cardiac Enzymes  Recent Labs Lab 07/05/15 1841  TROPONINI <0.03    Microbiology Results  Results for orders placed or performed during the hospital encounter of 07/05/15  MRSA PCR Screening     Status: None   Collection Time: 07/06/15  3:00 AM  Result Value Ref Range Status   MRSA by PCR NEGATIVE NEGATIVE Final    Comment:        The GeneXpert MRSA Assay (FDA approved for NASAL specimens only), is one component of a comprehensive MRSA colonization surveillance program. It is not intended to diagnose MRSA infection nor to guide or monitor treatment for MRSA infections.     RADIOLOGY:  Ct Foot Right Wo Contrast  07/08/2015   CLINICAL DATA:  Status post fall from 8.  While painting.  EXAM: CT OF THE RIGHT FOOT WITHOUT CONTRAST  TECHNIQUE: Multidetector CT imaging of the right foot was performed according to the standard protocol. Multiplanar CT image reconstructions were  also generated.  COMPARISON:  None.  FINDINGS: There is a severely comminuted impacted and mildly displaced fracture of the mid body of the calcaneus extending into the anterior aspect of the calcaneus. There is a small nondisplaced fracture of the anterior process of the calcaneus. There is a fracture cleft involving the articular surface of the posterior subtalar facet with an area of 2 mm of depression. The subtalar joints are congruent. There is a fracture cleft extending to the articular surface of the calcaneocuboid joint.  There is a comminuted fracture of the base of the first distal phalanx involving the articular surface.  There is a small nondisplaced fracture along the dorsal base of the third distal phalanx.  There is no other fracture or dislocation. There is no periosteal reaction. There is no bone destruction. There is mild osteoarthritis of the first MTP joint. There are bulky first metatarsal head marginal osteophytes.  The visualized portion of the peroneal tendons are intact. The Achilles tendon is intact. The flexor and extensor compartments are grossly intact. There is severe soft tissue edema involving the foot and ankle.  IMPRESSION: 1. Severely comminuted, impacted and mildly displaced fracture of the midbody and anterior calcaneus most consistent with a Sanders III AC injury. 2. Comminuted fracture of the base of the first distal phalanx. 3. Nondisplaced fracture along the dorsal base of the third distal phalanx.   Electronically Signed   By: Kathreen Devoid   On: 07/08/2015 12:56      Management plans discussed with the patient, family and they are in agreement.  CODE STATUS:     Code Status Orders        Start     Ordered   07/06/15 1939  Full code   Continuous     07/06/15 1938      TOTAL TIME TAKING CARE OF THIS PATIENT: 40 minutes.    Henreitta Leber M.D on 07/09/2015 at 8:41 AM  Between 7am to 6pm - Pager - [phone]  After 6pm go to [email or web address] - password  EPAS [facility]  [facility]  Office  [phone]  CC: Primary care physician; Otilio Miu, MD

## 2015-07-09 NOTE — Progress Notes (Signed)
Checked to see if pre-authorization would be needed for non-emergent EMS transport.  Per [employer]’s automated system, [phone], patient has a [employer] Medicare Advantage PPO policy.  [employer] Medicare PPO plans do not require pre-auth for non-emergent ground transports using service codes A0426 or A0428.   °

## 2015-07-09 NOTE — Progress Notes (Signed)
Clinical Education officer, museum (CSW) presented bed offers to patient and wife. They chose [facility]. Per Kim admissions coordinator at [facility] patient is going to a private room 218. RN will call report at [phone] and arrange EMS for transport. CSW prepared D/C packet and sent D/C Summary to [facility] today. Patient's wife was at bedside and aware of above. Please reconsult if future social work needs arise. CSW signing off.   Blima Rich, [place] [phone]

## 2015-07-09 NOTE — Care Management Important Message (Signed)
Important Message  Patient Details  Name: Nicholas Monroe MRN: [id number] Date of Birth: 07/19/1941   Medicare Important Message Given:  Yes-second notification given    Juliann Pulse A Allmond 07/09/2015, 9:59 AM

## 2015-07-09 NOTE — Progress Notes (Signed)
  Subjective: 3 Days Post-Op Procedure(s) (LRB): TOTAL HIP ARTHROPLASTY (Right) Patient reports pain as mild.   Patient is well, and has had no acute complaints or problems Plan is to go Skilled nursing facility after hospital stay. Negative for chest pain and shortness of breath Fever: no Gastrointestinal:Negative for nausea and vomiting  Objective: Vital signs in last 24 hours: Temp:  [98.9 F (37.2 C)-99.2 F (37.3 C)] 99 F (37.2 C) (07/25 0341) Pulse Rate:  [68-75] 70 (07/25 0341) Resp:  [18] 18 (07/25 0341) BP: (102-135)/(56-74) 118/62 mmHg (07/25 0341) SpO2:  [96 %-98 %] 96 % (07/25 0341)  Intake/Output from previous day:  Intake/Output Summary (Last 24 hours) at 07/09/15 0811 Last data filed at 07/09/15 0603  Gross per 24 hour  Intake    780 ml  Output   3475 ml  Net  -2695 ml    Intake/Output this shift:    Labs:  Recent Labs  07/07/15 0453 07/09/15 0427  HGB 9.8* 7.5*    Recent Labs  07/07/15 0453 07/09/15 0427  WBC 9.3 5.2  RBC 2.94* 2.28*  HCT 28.6* 21.9*  PLT 104* 100*    Recent Labs  07/08/15 0409 07/09/15 0427  NA 133* 134*  K 3.6 3.7  CL 104 104  CO2 24 24  BUN 14 15  CREATININE 1.02 1.18  GLUCOSE 117* 125*  CALCIUM 8.1* 8.0*   No results for input(s): LABPT, INR in the last 72 hours.   EXAM General - Patient is Alert, Appropriate and Oriented Extremity - Neurologically intact ABD soft Sensation intact distally Dorsiflexion/Plantar flexion intact Incision: moderate drainage Dressing/Incision - blood tinged drainage Motor Function - intact, moving foot and toes well on exam.   Honeycomb dressing changed today.  Past Medical History  Diagnosis Date  . Hypertension   . Hyperlipidemia     Assessment/Plan: 3 Days Post-Op Procedure(s) (LRB): TOTAL HIP ARTHROPLASTY (Right) Active Problems:   Closed right hip fracture  Estimated body mass index is 24.92 kg/(m^2) as calculated from the following:   Height as of this  encounter: 5\' 11"  (1.803 m).   Weight as of this encounter: 81.012 kg (178 lb 9.6 oz). Advance diet Up with therapy   Hg down to 7.5 today.  Pt is non-symptomatic, will continue to monitor. Pt needs to have BM before discharge.  Pt has Dulcolax, Colace, Milk of Mag and Fleet enema ordered. CT scan obtained of right ankle.  Non-surgical intervention planned at this stage. Plan is to be d/c to SNF.  Social Work faxed referral to [facility].  DVT Prophylaxis - Lovenox, Foot Pumps and TED hose Non-weight bearing to right lower extremity  J. Cameron Proud, PA-C [facility] 07/09/2015, 8:11 AM   I reviewed the CT scan of the right foot.  At this point, I feel he can be managed non-operatively.  He may remove the boot for bathing purposes, but is to remain nonweightbearing on the right foot during ambulation.  He may rest his   forefoot on the ground for balance when standing, so long as he is wearing his Cam walker boot. Please arrange for a follow-up in 7-8 days for wound check and staple removal.  J. Dorien Chihuahua, MD

## 2015-07-09 NOTE — Discharge Instructions (Signed)
Diet: As you were doing prior to hospitalization   Shower:  May shower but keep the wounds dry, use an occlusive plastic wrap, NO SOAKING IN TUB.  If the bandage gets wet, change with a clean dry gauze.  Dressing:  You may change your dressing as needed. Change the dressing with sterile gauze dressing.    Activity:  Increase activity slowly as tolerated, but follow the weight bearing instructions below.  No lifting or driving for 6 weeks.  Weight Bearing:   Non weight bearing to lower left extremity.  To prevent constipation: you may use a stool softener such as -  Colace (over the counter) 100 mg by mouth twice a day  Drink plenty of fluids (prune juice may be helpful) and high fiber foods Miralax (over the counter) for constipation as needed.    Itching:  If you experience itching with your medications, try taking only a single pain pill, or even half a pain pill at a time.  You may take up to 10 pain pills per day, and you can also use benadryl over the counter for itching or also to help with sleep.   Precautions:  If you experience chest pain or shortness of breath - call 911 immediately for transfer to the hospital emergency department!!  If you develop a fever greater that 101 F, purulent drainage from wound, increased redness or drainage from wound, or calf pain-Call [facility]                                              Follow- Up Appointment:  Please call for an appointment to be seen in 2 weeks at [facility]

## 2015-07-09 NOTE — Progress Notes (Signed)
Report called to Maudie Mercury at [place]. EMS notified of transport.

## 2015-07-09 NOTE — Clinical Social Work Placement (Signed)
   CLINICAL SOCIAL WORK PLACEMENT  NOTE  Date:  07/09/2015  Patient Details  Name: Nicholas Monroe MRN: [id number] Date of Birth: 05-05-1941  Clinical Social Work is seeking post-discharge placement for this patient at the [place] level of care (*CSW will initial, date and re-position this form in  chart as items are completed):  Yes   Patient/family provided with [facility]'s list of facilities offering this level of care within the geographic area requested by the patient (or if unable, by the patient's family).  Yes   Patient/family informed of their freedom to choose among providers that offer the needed level of care, that participate in Medicare, Medicaid or managed care program needed by the patient, have an available bed and are willing to accept the patient.  Yes   Patient/family informed of [facility]'s ownership interest in [facility] and [facility], as well as of the fact that they are under no obligation to receive care at these facilities.  PASRR submitted to EDS on 07/07/15     PASRR number received on 07/07/15     Existing PASRR number confirmed on       FL2 transmitted to all facilities in geographic area requested by pt/family on 07/07/15     FL2 transmitted to all facilities within larger geographic area on       Patient informed that his/her managed care company has contracts with or will negotiate with certain facilities, including the following:        Yes   Patient/family informed of bed offers received.  Patient chooses bed at  [facility] )     Physician recommends and patient chooses bed at      Patient to be transferred to   on 07/09/15.  Patient to be transferred to facility by  [facility] )     Patient family notified on 07/09/15 of transfer.  Name of family member notified:   (Wife at bedside and aware of above. )     PHYSICIAN       Additional Comment:     _______________________________________________ Loralyn Freshwater, LCSW 07/09/2015, 10:55 AM

## 2015-07-09 NOTE — Progress Notes (Signed)
   07/09/15 0940  Clinical Encounter Type  Visited With Patient and family together  Visit Type Initial  Consult/Referral To Chaplain  Spiritual Encounters  Spiritual Needs Emotional  Stress Factors  Patient Stress Factors Health changes  Family Stress Factors None identified  Chaplain rounded in unit and offered spiritual support as applicable. Chaplain Rasheedah Reis A. Arlet Marter Ext. [phone]

## 2015-07-10 DIAGNOSIS — D649 Anemia, unspecified: Secondary | ICD-10-CM | POA: Diagnosis present

## 2015-07-10 LAB — CBC WITH DIFFERENTIAL/PLATELET
BASOS ABS: 0 10*3/uL (ref 0–0.1)
BASOS PCT: 1 %
Eosinophils Absolute: 0.4 10*3/uL (ref 0–0.7)
Eosinophils Relative: 9 %
HEMATOCRIT: 23.2 % — AB (ref 40.0–52.0)
HEMOGLOBIN: 8.3 g/dL — AB (ref 13.0–18.0)
LYMPHS ABS: 0.5 10*3/uL — AB (ref 1.0–3.6)
Lymphocytes Relative: 10 %
MCH: 34.3 pg — AB (ref 26.0–34.0)
MCHC: 35.9 g/dL (ref 32.0–36.0)
MCV: 95.4 fL (ref 80.0–100.0)
Monocytes Absolute: 0.7 10*3/uL (ref 0.2–1.0)
Monocytes Relative: 15 %
NEUTROS ABS: 3 10*3/uL (ref 1.4–6.5)
Neutrophils Relative %: 65 %
PLATELETS: 159 10*3/uL (ref 150–440)
RBC: 2.43 MIL/uL — ABNORMAL LOW (ref 4.40–5.90)
RDW: 13.1 % (ref 11.5–14.5)
WBC: 4.6 10*3/uL (ref 3.8–10.6)

## 2015-07-11 LAB — SURGICAL PATHOLOGY

## 2015-07-13 ENCOUNTER — Other Ambulatory Visit
Admission: RE | Admit: 2015-07-13 | Discharge: 2015-07-13 | Disposition: A | Discharge status: Home / Self Care | Payer: Medicare Other | Source: Ambulatory Visit | Attending: Internal Medicine | Admitting: Internal Medicine

## 2015-07-13 DIAGNOSIS — R350 Frequency of micturition: Secondary | ICD-10-CM | POA: Diagnosis present

## 2015-07-13 DIAGNOSIS — R8290 Unspecified abnormal findings in urine: Secondary | ICD-10-CM | POA: Diagnosis present

## 2015-07-13 LAB — URINALYSIS COMPLETE WITH MICROSCOPIC (ARMC ONLY)
Bilirubin Urine: NEGATIVE
Glucose, UA: NEGATIVE mg/dL
KETONES UR: NEGATIVE mg/dL
NITRITE: POSITIVE — AB
PH: 7 (ref 5.0–8.0)
Protein, ur: NEGATIVE mg/dL
Specific Gravity, Urine: 1.013 (ref 1.005–1.030)

## 2015-07-15 LAB — URINE CULTURE: Culture: >=100000

## 2015-07-16 ENCOUNTER — Encounter
Admission: RE | Admit: 2015-07-16 | Discharge: 2015-07-16 | Disposition: A | Discharge status: Home / Self Care | Payer: Medicare Other | Source: Ambulatory Visit | Attending: Internal Medicine | Admitting: Internal Medicine

## 2015-07-19 NOTE — Addendum Note (Signed)
Addendum  created 07/19/15 4540 by Molli Barrows, MD   Modules edited: Anesthesia Events, Narrator   Narrator:  Narrator: Event Log Edited

## 2015-09-27 ENCOUNTER — Ambulatory Visit (INDEPENDENT_AMBULATORY_CARE_PROVIDER_SITE_OTHER): Payer: Medicare Other | Admitting: Family Medicine

## 2015-09-27 ENCOUNTER — Encounter: Payer: Self-pay | Admitting: Family Medicine

## 2015-09-27 VITALS — BP 138/82 | HR 60 | Ht 71.0 in | Wt 180.0 lb

## 2015-09-27 DIAGNOSIS — E785 Hyperlipidemia, unspecified: Secondary | ICD-10-CM

## 2015-09-27 DIAGNOSIS — N401 Enlarged prostate with lower urinary tract symptoms: Secondary | ICD-10-CM

## 2015-09-27 DIAGNOSIS — Z1211 Encounter for screening for malignant neoplasm of colon: Secondary | ICD-10-CM | POA: Diagnosis not present

## 2015-09-27 DIAGNOSIS — R351 Nocturia: Secondary | ICD-10-CM | POA: Diagnosis not present

## 2015-09-27 DIAGNOSIS — I10 Essential (primary) hypertension: Secondary | ICD-10-CM

## 2015-09-27 DIAGNOSIS — Z23 Encounter for immunization: Secondary | ICD-10-CM | POA: Diagnosis not present

## 2015-09-27 LAB — HEMOCCULT GUIAC POC 1CARD (OFFICE): Fecal Occult Blood, POC: NEGATIVE

## 2015-09-27 MED ORDER — LISINOPRIL 10 MG PO TABS: 10.0000 mg | ORAL_TABLET | Freq: Every day | ORAL | Status: DC

## 2015-09-27 MED ORDER — OMEGA-3-ACID ETHYL ESTERS 1 G PO CAPS: 2.0000 g | ORAL_CAPSULE | Freq: Two times a day (BID) | ORAL | Status: DC

## 2015-09-27 MED ORDER — FENOFIBRATE 145 MG PO TABS: 72.5000 mg | ORAL_TABLET | Freq: Every day | ORAL | Status: DC

## 2015-09-27 MED ORDER — ATENOLOL 50 MG PO TABS: 50.0000 mg | ORAL_TABLET | Freq: Two times a day (BID) | ORAL | Status: DC

## 2015-09-27 NOTE — Progress Notes (Signed)
Name: Nicholas Monroe   MRN: [id number]    DOB: 1941/06/19   Date:09/27/2015       Progress Note  Subjective  Chief Complaint  Chief Complaint  Patient presents with  . Annual Exam  . Hyperlipidemia  . Hypertension    Hyperlipidemia This is a chronic problem. The current episode started more than 1 year ago. The problem is controlled. Recent lipid tests were reviewed and are normal. He has no history of chronic renal disease, diabetes, hypothyroidism, liver disease, obesity or nephrotic syndrome. Pertinent negatives include no chest pain, focal weakness, myalgias or shortness of breath. Current antihyperlipidemic treatment includes fibric acid derivatives. The current treatment provides moderate improvement of lipids. There are no compliance problems.  Risk factors for coronary artery disease include dyslipidemia and male sex.  Hypertension This is a chronic problem. The current episode started more than 1 year ago. The problem has been gradually improving since onset. The problem is controlled. Pertinent negatives include no anxiety, blurred vision, chest pain, headaches, malaise/fatigue, neck pain, orthopnea, palpitations, peripheral edema, PND, shortness of breath or sweats. There are no associated agents to hypertension. There are no known risk factors for coronary artery disease. Past treatments include ACE inhibitors and beta blockers. There is no history of angina, kidney disease, CAD/MI, CVA, heart failure, left ventricular hypertrophy, PVD, renovascular disease or retinopathy. There is no history of chronic renal disease.    No problem-specific assessment & plan notes found for this encounter.   Past Medical History  Diagnosis Date  . Hypertension   . Hyperlipidemia     Past Surgical History  Procedure Laterality Date  . Appendectomy    . Hernia repair    . 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 Colonoscopy  2011    normal- Dr Bary Castilla    Family History  Problem Relation Age of Onset  . Hypertension Father   . Diabetes Brother     Social History   Social History  . Marital Status: Married    Spouse Name: N/A  . Number of Children: N/A  . Years of Education: N/A   Occupational History  . Not on file.   Social History Main Topics  . Smoking status: Never Smoker   . Smokeless tobacco: Never Used  . Alcohol Use: No  . Drug Use: No  . Sexual Activity: Not Currently   Other Topics Concern  . Not on file   Social History Narrative    No Known Allergies   Review of Systems  Constitutional: Negative for fever, chills, weight loss and malaise/fatigue.  HENT: Negative for ear discharge, ear pain and sore throat.   Eyes: Negative for blurred vision.  Respiratory: Negative for cough, sputum production, shortness of breath and wheezing.   Cardiovascular: Negative for chest pain, palpitations, orthopnea, leg swelling and PND.  Gastrointestinal: Negative for heartburn, nausea, abdominal pain, diarrhea, constipation, blood in stool and melena.  Genitourinary: Negative for dysuria, urgency, frequency and hematuria.       Nocturia  Musculoskeletal: Negative for myalgias, back pain, joint pain and neck pain.  Skin: Negative for rash.  Neurological: Negative for dizziness, tingling, sensory change, focal weakness and headaches.  Endo/Heme/Allergies: Negative for environmental allergies and polydipsia. Does not bruise/bleed easily.  Psychiatric/Behavioral: Negative for depression and suicidal ideas. The patient is not nervous/anxious and does not have insomnia.  Objective  Filed Vitals:   09/27/15 0826  BP: 138/82  Pulse: 60  Height: 5\' 11"  (1.803 m)  Weight: 180 lb (81.647 kg)    Physical Exam  Constitutional: He is oriented to person, place, and time and well-developed, well-nourished, and in no  distress.  HENT:  Head: Normocephalic.  Right Ear: External ear normal.  Left Ear: External ear normal.  Nose: Nose normal.  Mouth/Throat: Oropharynx is clear and moist.  Eyes: Conjunctivae and EOM are normal. Pupils are equal, round, and reactive to light. Right eye exhibits no discharge. Left eye exhibits no discharge. No scleral icterus.  Neck: Normal range of motion. Neck supple. No JVD present. No tracheal deviation present. No thyromegaly present.  Cardiovascular: Normal rate, regular rhythm, normal heart sounds and intact distal pulses.  Exam reveals no gallop and no friction rub.   No murmur heard. Pulmonary/Chest: Breath sounds normal. No respiratory distress. He has no wheezes. He has no rales.  Abdominal: Soft. Bowel sounds are normal. He exhibits no mass. There is no hepatosplenomegaly. There is no tenderness. There is no rebound, no guarding and no CVA tenderness.  Genitourinary: Rectum normal and prostate normal.  Musculoskeletal: Normal range of motion. He exhibits no edema or tenderness.  Lymphadenopathy:    He has no cervical adenopathy.  Neurological: He is alert and oriented to person, place, and time. He has normal sensation, normal strength, normal reflexes and intact cranial nerves. No cranial nerve deficit.  Skin: Skin is warm. No rash noted.  Psychiatric: Mood and affect normal.      Assessment & Plan  Problem List Items Addressed This Visit    None    Visit Diagnoses    Essential hypertension    -  Primary    Relevant Medications    atenolol (TENORMIN) 50 MG tablet    fenofibrate (TRICOR) 145 MG tablet    lisinopril (PRINIVIL,ZESTRIL) 10 MG tablet    omega-3 acid ethyl esters (LOVAZA) 1 G capsule    Other Relevant Orders    Renal Function Panel    Hyperlipidemia        Relevant Medications    atenolol (TENORMIN) 50 MG tablet    fenofibrate (TRICOR) 145 MG tablet    lisinopril (PRINIVIL,ZESTRIL) 10 MG tablet    omega-3 acid ethyl esters (LOVAZA) 1 G  capsule    Other Relevant Orders    Lipid Profile    BPH associated with nocturia        Relevant Orders    PSA    Colon cancer screening        Relevant Orders    POCT Occult Blood Stool (Completed)    Need for influenza vaccination        Relevant Orders    Flu Vaccine QUAD 36+ mos PF IM (Fluarix & Fluzone Quad PF) (Completed)         Dr. Deanna Jones [facility]  09/27/2015

## 2015-09-28 LAB — RENAL FUNCTION PANEL
ALBUMIN: 4.5 g/dL (ref 3.5–4.8)
BUN/Creatinine Ratio: 15 (ref 10–22)
BUN: 15 mg/dL (ref 8–27)
CALCIUM: 9.9 mg/dL (ref 8.6–10.2)
CO2: 25 mmol/L (ref 18–29)
Chloride: 99 mmol/L (ref 97–108)
Creatinine, Ser: 1 mg/dL (ref 0.76–1.27)
GFR calc non Af Amer: 74 mL/min/{1.73_m2} (ref >59)
GFR, EST AFRICAN AMERICAN: 85 mL/min/{1.73_m2} (ref >59)
Glucose: 96 mg/dL (ref 65–99)
Phosphorus: 3.4 mg/dL (ref 2.5–4.5)
Potassium: 4.8 mmol/L (ref 3.5–5.2)
Sodium: 142 mmol/L (ref 134–144)

## 2015-09-28 LAB — PSA: PROSTATE SPECIFIC AG, SERUM: 2 ng/mL (ref 0.0–4.0)

## 2015-09-28 LAB — LIPID PANEL
Chol/HDL Ratio: 4.7 ratio units (ref 0.0–5.0)
Cholesterol, Total: 145 mg/dL (ref 100–199)
HDL: 31 mg/dL — AB (ref >39)
LDL Calculated: 89 mg/dL (ref 0–99)
Triglycerides: 125 mg/dL (ref 0–149)
VLDL Cholesterol Cal: 25 mg/dL (ref 5–40)

## 2016-05-02 ENCOUNTER — Other Ambulatory Visit: Payer: Self-pay

## 2016-05-02 DIAGNOSIS — I10 Essential (primary) hypertension: Secondary | ICD-10-CM

## 2016-05-02 DIAGNOSIS — E785 Hyperlipidemia, unspecified: Secondary | ICD-10-CM

## 2016-05-02 NOTE — Telephone Encounter (Signed)
Pharmacy requesting refills on medications.

## 2016-05-08 MED ORDER — OMEGA-3-ACID ETHYL ESTERS 1 G PO CAPS: 2.0000 g | ORAL_CAPSULE | Freq: Two times a day (BID) | ORAL | Status: DC

## 2016-05-08 MED ORDER — ATENOLOL 50 MG PO TABS: 50.0000 mg | ORAL_TABLET | Freq: Two times a day (BID) | ORAL | Status: DC

## 2016-05-08 MED ORDER — LISINOPRIL 10 MG PO TABS: 10.0000 mg | ORAL_TABLET | Freq: Every day | ORAL | Status: DC

## 2016-05-15 ENCOUNTER — Ambulatory Visit (INDEPENDENT_AMBULATORY_CARE_PROVIDER_SITE_OTHER): Payer: Medicare Other | Admitting: Family Medicine

## 2016-05-15 ENCOUNTER — Encounter: Payer: Self-pay | Admitting: Family Medicine

## 2016-05-15 VITALS — BP 128/70 | HR 80 | Ht 71.0 in | Wt 181.0 lb

## 2016-05-15 DIAGNOSIS — I10 Essential (primary) hypertension: Secondary | ICD-10-CM | POA: Insufficient documentation

## 2016-05-15 DIAGNOSIS — E785 Hyperlipidemia, unspecified: Secondary | ICD-10-CM | POA: Diagnosis not present

## 2016-05-15 DIAGNOSIS — IMO0001 Reserved for inherently not codable concepts without codable children: Secondary | ICD-10-CM

## 2016-05-15 DIAGNOSIS — Z1211 Encounter for screening for malignant neoplasm of colon: Secondary | ICD-10-CM

## 2016-05-15 DIAGNOSIS — W19XXXS Unspecified fall, sequela: Secondary | ICD-10-CM | POA: Diagnosis not present

## 2016-05-15 LAB — HEMOCCULT GUIAC POC 1CARD (OFFICE): Fecal Occult Blood, POC: NEGATIVE

## 2016-05-15 MED ORDER — OMEGA-3-ACID ETHYL ESTERS 1 G PO CAPS: 2.0000 g | ORAL_CAPSULE | Freq: Two times a day (BID) | ORAL | Status: DC

## 2016-05-15 MED ORDER — FENOFIBRATE 145 MG PO TABS: 72.5000 mg | ORAL_TABLET | Freq: Every day | ORAL | Status: DC

## 2016-05-15 MED ORDER — LISINOPRIL 10 MG PO TABS: 10.0000 mg | ORAL_TABLET | Freq: Every day | ORAL | Status: DC

## 2016-05-15 MED ORDER — ATENOLOL 50 MG PO TABS: 50.0000 mg | ORAL_TABLET | Freq: Two times a day (BID) | ORAL | Status: DC

## 2016-05-15 NOTE — Progress Notes (Signed)
Name: Nicholas Monroe   MRN: [id number]    DOB: 05/12/1941   Date:05/15/2016       Progress Note  Subjective  Chief Complaint  Chief Complaint  Patient presents with  . Hypertension  . Hyperlipidemia    Hypertension This is a chronic problem. The current episode started more than 1 year ago. The problem has been gradually improving since onset. The problem is controlled. Pertinent negatives include no anxiety, blurred vision, chest pain, headaches, malaise/fatigue, neck pain, orthopnea, palpitations, peripheral edema, PND or shortness of breath. There are no associated agents to hypertension. Risk factors for coronary artery disease include dyslipidemia, male gender and stress. Past treatments include ACE inhibitors and beta blockers. The current treatment provides mild improvement. There are no compliance problems.  There is no history of angina, kidney disease, CAD/MI, CVA, heart failure, left ventricular hypertrophy, PVD, renovascular disease or retinopathy. There is no history of chronic renal disease or a hypertension causing med.  Hyperlipidemia This is a chronic problem. The current episode started more than 1 year ago. The problem is controlled. Recent lipid tests were reviewed and are normal. He has no history of chronic renal disease, diabetes, hypothyroidism, liver disease, obesity or nephrotic syndrome. Factors aggravating his hyperlipidemia include beta blockers. Pertinent negatives include no chest pain, focal sensory loss, focal weakness, leg pain, myalgias or shortness of breath. Current antihyperlipidemic treatment includes fibric acid derivatives (lovasa). The current treatment provides moderate improvement of lipids. There are no compliance problems.     No problem-specific assessment & plan notes found for this encounter.   Past Medical History  Diagnosis Date  . Hypertension   . Hyperlipidemia     Past Surgical History  Procedure Laterality Date  . Appendectomy    .  Hernia repair    . 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 Colonoscopy  2011    normal- Dr Bary Castilla    Family History  Problem Relation Age of Onset  . Hypertension Father   . Diabetes Brother     Social History   Social History  . Marital Status: Married    Spouse Name: N/A  . Number of Children: N/A  . Years of Education: N/A   Occupational History  . Not on file.   Social History Main Topics  . Smoking status: Never Smoker   . Smokeless tobacco: Never Used  . Alcohol Use: No  . Drug Use: No  . Sexual Activity: Not Currently   Other Topics Concern  . Not on file   Social History Narrative    No Known Allergies   Review of Systems  Constitutional: Negative for fever, chills, weight loss and malaise/fatigue.  HENT: Negative for ear discharge, ear pain and sore throat.   Eyes: Negative for blurred vision.  Respiratory: Negative for cough, sputum production, shortness of breath and wheezing.   Cardiovascular: Negative for chest pain, palpitations, orthopnea, leg swelling and PND.  Gastrointestinal: Negative for heartburn, nausea, abdominal pain, diarrhea, constipation, blood in stool and melena.  Genitourinary: Negative for dysuria, urgency, frequency and hematuria.  Musculoskeletal: Negative for myalgias, back pain, joint pain and neck pain.  Skin: Negative for rash.  Neurological: Negative for dizziness, tingling, sensory change, focal weakness and headaches.  Endo/Heme/Allergies: Negative for environmental allergies and polydipsia. Does not bruise/bleed easily.  Psychiatric/Behavioral: Negative for depression and suicidal ideas. The patient  is not nervous/anxious and does not have insomnia.      Objective  Filed Vitals:   05/15/16 0908  BP: 128/70  Pulse: 80  Height: 5\' 11"  (1.803 m)  Weight: 181 lb (82.101 kg)     Physical Exam  Constitutional: He is oriented to person, place, and time and well-developed, well-nourished, and in no distress.  HENT:  Head: Normocephalic.  Right Ear: External ear normal.  Left Ear: External ear normal.  Nose: Nose normal.  Mouth/Throat: Oropharynx is clear and moist.  Eyes: Conjunctivae and EOM are normal. Pupils are equal, round, and reactive to light. Right eye exhibits no discharge. Left eye exhibits no discharge. No scleral icterus.  Neck: Normal range of motion. Neck supple. No JVD present. No tracheal deviation present. No thyromegaly present.  Cardiovascular: Normal rate, regular rhythm, normal heart sounds and intact distal pulses.  Exam reveals no gallop and no friction rub.   No murmur heard. Pulmonary/Chest: Breath sounds normal. No respiratory distress. He has no wheezes. He has no rales.  Abdominal: Soft. Bowel sounds are normal. He exhibits no mass. There is no hepatosplenomegaly. There is no tenderness. There is no rebound, no guarding and no CVA tenderness.  Genitourinary: Rectum normal and prostate normal. Guaiac negative stool.  Musculoskeletal: Normal range of motion. He exhibits no edema or tenderness.  Lymphadenopathy:    He has no cervical adenopathy.  Neurological: He is alert and oriented to person, place, and time. He has normal sensation, normal strength, normal reflexes and intact cranial nerves. No cranial nerve deficit.  Skin: Skin is warm. No rash noted.  Psychiatric: Mood, memory, affect and judgment normal.  Nursing note and vitals reviewed.     Assessment & Plan  Problem List Items Addressed This Visit      Cardiovascular and Mediastinum   Essential hypertension - Primary   Relevant Medications   atenolol (TENORMIN) 50 MG tablet   lisinopril (PRINIVIL,ZESTRIL) 10 MG tablet   fenofibrate (TRICOR) 145 MG tablet   omega-3 acid ethyl esters (LOVAZA) 1 g capsule   Other Relevant Orders   Renal Function Panel     Other    Hyperlipidemia   Relevant Medications   atenolol (TENORMIN) 50 MG tablet   lisinopril (PRINIVIL,ZESTRIL) 10 MG tablet   fenofibrate (TRICOR) 145 MG tablet   omega-3 acid ethyl esters (LOVAZA) 1 g capsule   Other Relevant Orders   Lipid Profile    Other Visit Diagnoses    Colon cancer screening        Relevant Orders    POCT Occult Blood Stool (Completed)    Fall in elderly patient, sequela        discuss prevention         Dr. Terrionna Bridwell [facility]  05/15/2016

## 2016-05-15 NOTE — Patient Instructions (Signed)
Fall Prevention in the Home  Falls can cause injuries and can affect people from all age groups. There are many simple things that you can do to make your home safe and to help prevent falls. WHAT CAN I DO ON THE OUTSIDE OF MY HOME?  Regularly repair the edges of walkways and driveways and fix any cracks.  Remove high doorway thresholds.  Trim any shrubbery on the main path into your home.  Use bright outdoor lighting.  Clear walkways of debris and clutter, including tools and rocks.  Regularly check that handrails are securely fastened and in good repair. Both sides of any steps should have handrails.  Install guardrails along the edges of any raised decks or porches.  Have leaves, snow, and ice cleared regularly.  Use sand or salt on walkways during winter months.  In the garage, clean up any spills right away, including grease or oil spills. WHAT CAN I DO IN THE BATHROOM?  Use night lights.  Install grab bars by the toilet and in the tub and shower. Do not use towel bars as grab bars.  Use non-skid mats or decals on the floor of the tub or shower.  If you need to sit down while you are in the shower, use a plastic, non-slip stool..  Keep the floor dry. Immediately clean up any water that spills on the floor.  Remove soap buildup in the tub or shower on a regular basis.  Attach bath mats securely with double-sided non-slip rug tape.  Remove throw rugs and other tripping hazards from the floor. WHAT CAN I DO IN THE BEDROOM?  Use night lights.  Make sure that a bedside light is easy to reach.  Do not use oversized bedding that drapes onto the floor.  Have a firm chair that has side arms to use for getting dressed.  Remove throw rugs and other tripping hazards from the floor. WHAT CAN I DO IN THE KITCHEN?   Clean up any spills right away.  Avoid walking on wet floors.  Place frequently used items in easy-to-reach places.  If you need to reach for something  above you, use a sturdy step stool that has a grab bar.  Keep electrical cables out of the way.  Do not use floor polish or wax that makes floors slippery. If you have to use wax, make sure that it is non-skid floor wax.  Remove throw rugs and other tripping hazards from the floor. WHAT CAN I DO IN THE STAIRWAYS?  Do not leave any items on the stairs.  Make sure that there are handrails on both sides of the stairs. Fix handrails that are broken or loose. Make sure that handrails are as long as the stairways.  Check any carpeting to make sure that it is firmly attached to the stairs. Fix any carpet that is loose or worn.  Avoid having throw rugs at the top or bottom of stairways, or secure the rugs with carpet tape to prevent them from moving.  Make sure that you have a light switch at the top of the stairs and the bottom of the stairs. If you do not have them, have them installed. WHAT ARE SOME OTHER FALL PREVENTION TIPS?  Wear closed-toe shoes that fit well and support your feet. Wear shoes that have rubber soles or low heels.  When you use a stepladder, make sure that it is completely opened and that the sides are firmly locked. Have someone hold the ladder while you   are using it. Do not climb a closed stepladder.  Add color or contrast paint or tape to grab bars and handrails in your home. Place contrasting color strips on the first and last steps.  Use mobility aids as needed, such as canes, walkers, scooters, and crutches.  Turn on lights if it is dark. Replace any light bulbs that burn out.  Set up furniture so that there are clear paths. Keep the furniture in the same spot.  Fix any uneven floor surfaces.  Choose a carpet design that does not hide the edge of steps of a stairway.  Be aware of any and all pets.  Review your medicines with your healthcare provider. Some medicines can cause dizziness or changes in blood pressure, which increase your risk of falling. Talk  with your health care provider about other ways that you can decrease your risk of falls. This may include working with a physical therapist or trainer to improve your strength, balance, and endurance.   This information is not intended to replace advice given to you by your health care provider. Make sure you discuss any questions you have with your health care provider.   Document Released: 11/21/2002 Document Revised: 04/17/2015 Document Reviewed: 01/05/2015 Elsevier Interactive Patient Education 2016 [employer]  

## 2016-05-15 NOTE — Addendum Note (Signed)
Addended by: Fredderick Severance on: 05/15/2016 12:18 PM   Modules accepted: Orders

## 2016-05-16 LAB — RENAL FUNCTION PANEL
Albumin: 4.4 g/dL (ref 3.5–4.8)
BUN / CREAT RATIO: 20 (ref 10–24)
BUN: 20 mg/dL (ref 8–27)
CO2: 19 mmol/L (ref 18–29)
CREATININE: 1 mg/dL (ref 0.76–1.27)
Calcium: 9.3 mg/dL (ref 8.6–10.2)
Chloride: 100 mmol/L (ref 96–106)
GFR, EST AFRICAN AMERICAN: 85 mL/min/{1.73_m2} (ref >59)
GFR, EST NON AFRICAN AMERICAN: 74 mL/min/{1.73_m2} (ref >59)
Glucose: 88 mg/dL (ref 65–99)
Phosphorus: 3.2 mg/dL (ref 2.5–4.5)
Potassium: 4.3 mmol/L (ref 3.5–5.2)
SODIUM: 139 mmol/L (ref 134–144)

## 2016-05-16 LAB — LIPID PANEL
CHOL/HDL RATIO: 4.4 ratio (ref 0.0–5.0)
CHOLESTEROL TOTAL: 148 mg/dL (ref 100–199)
HDL: 34 mg/dL — ABNORMAL LOW (ref >39)
LDL CALC: 93 mg/dL (ref 0–99)
Triglycerides: 104 mg/dL (ref 0–149)
VLDL CHOLESTEROL CAL: 21 mg/dL (ref 5–40)

## 2016-12-29 ENCOUNTER — Ambulatory Visit
Admission: EM | Admit: 2016-12-29 | Discharge: 2016-12-29 | Disposition: A | Discharge status: Home / Self Care | Payer: Medicare Other | Attending: Family Medicine | Admitting: Family Medicine

## 2016-12-29 ENCOUNTER — Encounter: Payer: Self-pay | Admitting: Emergency Medicine

## 2016-12-29 DIAGNOSIS — R059 Cough, unspecified: Secondary | ICD-10-CM

## 2016-12-29 DIAGNOSIS — R05 Cough: Secondary | ICD-10-CM

## 2016-12-29 DIAGNOSIS — J0100 Acute maxillary sinusitis, unspecified: Secondary | ICD-10-CM

## 2016-12-29 MED ORDER — GUAIFENESIN-CODEINE 100-10 MG/5ML PO SOLN: ORAL | 0 refills | Status: DC

## 2016-12-29 MED ORDER — AMOXICILLIN 875 MG PO TABS: 875.0000 mg | ORAL_TABLET | Freq: Two times a day (BID) | ORAL | 0 refills | Status: DC

## 2016-12-29 NOTE — ED Provider Notes (Signed)
[facility]    CSN: [id number] Arrival date & time: 12/29/16  [id number]     History   Chief Complaint Chief Complaint  Patient presents with  . Cough  . Nasal Congestion    HPI Nicholas Monroe is a 76 y.o. male.   The history is provided by the patient.  URI  Presenting symptoms: congestion, cough, facial pain, fatigue and rhinorrhea   Severity:  Moderate Onset quality:  Sudden Duration:  10 days Timing:  Constant Progression:  Worsening Chronicity:  New Relieved by:  Nothing Ineffective treatments:  OTC medications Associated symptoms: headaches and sinus pain   Risk factors: being elderly and sick contacts   Risk factors: no chronic cardiac disease, no chronic kidney disease, no chronic respiratory disease, no diabetes mellitus, no immunosuppression, no recent illness and no recent travel     Past Medical History:  Diagnosis Date  . Hyperlipidemia   . Hypertension     Patient Active Problem List   Diagnosis Date Noted  . Hyperlipidemia 05/15/2016  . Essential hypertension 05/15/2016  . Closed right hip fracture ([place]) 07/05/2015    Past Surgical History:  Procedure Laterality Date  . APPENDECTOMY    . COLONOSCOPY  2011   normal- Dr Bary Castilla  . HERNIA REPAIR    . 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 Authorizing Provider  amoxicillin (AMOXIL) 875 MG tablet Take 1 tablet (875 mg total) by mouth 2 (two) times daily. 12/29/16   Norval Gable, MD  aspirin EC 81 MG tablet Take 81 mg by mouth daily.    Historical Provider, MD  atenolol (TENORMIN) 50 MG tablet Take 1 tablet (50 mg total) by mouth 2 (two) times daily. 05/15/16   Juline Patch, MD  fenofibrate (TRICOR) 145 MG tablet Take 0.5 tablets (72.5 mg total) by mouth  at bedtime. 05/15/16   Juline Patch, MD  folic acid (FOLVITE) 1 MG tablet Take 1 mg by mouth daily. Pt does not take on Saturday.- Dr Manuella Ghazi    Historical Provider, MD  guaiFENesin-codeine 100-10 MG/5ML syrup 10 ml po qhs prn 12/29/16   Norval Gable, MD  lisinopril (PRINIVIL,ZESTRIL) 10 MG tablet Take 1 tablet (10 mg total) by mouth daily. 05/15/16   Juline Patch, MD  methotrexate (RHEUMATREX) 2.5 MG tablet Take 10 mg by mouth once a week. Pt takes on Saturday. Dr Manuella Ghazi    Historical Provider, MD  Multiple Vitamins-Minerals (CENTRUM SILVER PO) Take 1 tablet by mouth daily.    Historical Provider, MD  omega-3 acid ethyl esters (LOVAZA) 1 g capsule Take 2 capsules (2 g total) by mouth 2 (two) times daily. 05/15/16   Juline Patch, MD    Family History Family History  Problem Relation Age of Onset  . Hypertension Father   . Diabetes Brother     Social History Social History  Substance Use Topics  . Smoking status: Never Smoker  . Smokeless tobacco: Never Used  . Alcohol use No     Allergies   Patient has no known allergies.   Review of Systems Review of Systems  Constitutional: Positive for fatigue.  HENT: Positive for congestion, rhinorrhea and sinus pain.   Respiratory: Positive for cough.   Neurological: Positive  for headaches.     Physical Exam Triage Vital Signs ED Triage Vitals  Enc Vitals Group     BP 12/29/16 1008 140/70     Pulse Rate 12/29/16 1008 62     Resp 12/29/16 1008 16     Temp 12/29/16 1008 98.2 F (36.8 C)     Temp Source 12/29/16 1008 Oral     SpO2 12/29/16 1008 100 %     Weight 12/29/16 1006 185 lb (83.9 kg)     Height 12/29/16 1006 5\' 11"  (1.803 m)     Head Circumference --      Peak Flow --      Pain Score 12/29/16 1007 2     Pain Loc --      Pain Edu? --      Excl. in [place]? --    No data found.   Updated Vital Signs BP 140/70 (BP Location: Left Arm)   Pulse 62   Temp 98.2 F (36.8 C) (Oral)   Resp 16   Ht 5\' 11"  (1.803 m)   Wt 185 lb  (83.9 kg)   SpO2 100%   BMI 25.80 kg/m   Visual Acuity Right Eye Distance:   Left Eye Distance:   Bilateral Distance:    Right Eye Near:   Left Eye Near:    Bilateral Near:     Physical Exam  Constitutional: He appears well-developed and well-nourished. No distress.  HENT:  Head: Normocephalic and atraumatic.  Right Ear: Tympanic membrane, external ear and ear canal normal.  Left Ear: Tympanic membrane, external ear and ear canal normal.  Nose: Right sinus exhibits maxillary sinus tenderness and frontal sinus tenderness. Left sinus exhibits maxillary sinus tenderness and frontal sinus tenderness.  Mouth/Throat: Uvula is midline, oropharynx is clear and moist and mucous membranes are normal. No oropharyngeal exudate or tonsillar abscesses.  Eyes: Conjunctivae and EOM are normal. Pupils are equal, round, and reactive to light. Right eye exhibits no discharge. Left eye exhibits no discharge. No scleral icterus.  Neck: Normal range of motion. Neck supple. No tracheal deviation present. No thyromegaly present.  Cardiovascular: Normal rate, regular rhythm and normal heart sounds.   Pulmonary/Chest: Effort normal and breath sounds normal. No stridor. No respiratory distress. He has no wheezes. He has no rales. He exhibits no tenderness.  Lymphadenopathy:    He has no cervical adenopathy.  Neurological: He is alert.  Skin: Skin is warm and dry. No rash noted. He is not diaphoretic.  Nursing note and vitals reviewed.    UC Treatments / Results  Labs (all labs ordered are listed, but only abnormal results are displayed) Labs Reviewed - No data to display  EKG  EKG Interpretation None       Radiology No results found.  Procedures Procedures (including critical care time)  Medications Ordered in UC Medications - No data to display   Initial Impression / Assessment and Plan / UC Course  I have reviewed the triage vital signs and the nursing notes.  Pertinent labs &  imaging results that were available during my care of the patient were reviewed by me and considered in my medical decision making (see chart for details).  Clinical Course      Final Clinical Impressions(s) / UC Diagnoses   Final diagnoses:  Acute maxillary sinusitis, recurrence not specified  Cough    New Prescriptions Discharge Medication List as of 12/29/2016 10:38 AM    START taking these medications   Details  amoxicillin (  AMOXIL) 875 MG tablet Take 1 tablet (875 mg total) by mouth 2 (two) times daily., Starting Mon 12/29/2016, Normal    guaiFENesin-codeine 100-10 MG/5ML syrup 10 ml po qhs prn, Print       1. diagnosis reviewed with patient 2. rx as per orders above; reviewed possible side effects, interactions, risks and benefits  3. Recommend supportive treatment with otc flonase 4. Follow-up prn if symptoms worsen or don't improve   Norval Gable, MD 12/29/16 1055

## 2016-12-29 NOTE — ED Triage Notes (Signed)
Patient c/o cough, runny nose, and nasal congestion for over a week.  Patient denies fever,

## 2017-01-02 ENCOUNTER — Other Ambulatory Visit: Payer: Self-pay

## 2017-01-02 DIAGNOSIS — I10 Essential (primary) hypertension: Secondary | ICD-10-CM

## 2017-01-02 DIAGNOSIS — E785 Hyperlipidemia, unspecified: Secondary | ICD-10-CM

## 2017-01-02 MED ORDER — OMEGA-3-ACID ETHYL ESTERS 1 G PO CAPS: 2.0000 g | ORAL_CAPSULE | Freq: Two times a day (BID) | ORAL | 0 refills | Status: DC

## 2017-01-02 MED ORDER — ATENOLOL 50 MG PO TABS: 50.0000 mg | ORAL_TABLET | Freq: Two times a day (BID) | ORAL | 0 refills | Status: DC

## 2017-01-14 ENCOUNTER — Ambulatory Visit (INDEPENDENT_AMBULATORY_CARE_PROVIDER_SITE_OTHER): Payer: Medicare Other | Admitting: Family Medicine

## 2017-01-14 ENCOUNTER — Encounter: Payer: Self-pay | Admitting: Family Medicine

## 2017-01-14 VITALS — BP 124/80 | HR 60 | Ht 71.0 in | Wt 186.0 lb

## 2017-01-14 DIAGNOSIS — E782 Mixed hyperlipidemia: Secondary | ICD-10-CM | POA: Diagnosis not present

## 2017-01-14 DIAGNOSIS — Z23 Encounter for immunization: Secondary | ICD-10-CM

## 2017-01-14 DIAGNOSIS — I10 Essential (primary) hypertension: Secondary | ICD-10-CM

## 2017-01-14 DIAGNOSIS — Z1211 Encounter for screening for malignant neoplasm of colon: Secondary | ICD-10-CM

## 2017-01-14 DIAGNOSIS — R351 Nocturia: Secondary | ICD-10-CM

## 2017-01-14 DIAGNOSIS — E785 Hyperlipidemia, unspecified: Secondary | ICD-10-CM | POA: Diagnosis not present

## 2017-01-14 DIAGNOSIS — E875 Hyperkalemia: Secondary | ICD-10-CM

## 2017-01-14 LAB — HEMOCCULT GUIAC POC 1CARD (OFFICE): FECAL OCCULT BLD: NEGATIVE

## 2017-01-14 MED ORDER — OMEGA-3-ACID ETHYL ESTERS 1 G PO CAPS: 2.0000 g | ORAL_CAPSULE | Freq: Two times a day (BID) | ORAL | 0 refills | Status: DC

## 2017-01-14 MED ORDER — ATENOLOL 50 MG PO TABS: 50.0000 mg | ORAL_TABLET | Freq: Two times a day (BID) | ORAL | 0 refills | Status: DC

## 2017-01-14 MED ORDER — FENOFIBRATE 145 MG PO TABS: 72.5000 mg | ORAL_TABLET | Freq: Every day | ORAL | 6 refills | Status: DC

## 2017-01-14 MED ORDER — LISINOPRIL 10 MG PO TABS: 10.0000 mg | ORAL_TABLET | Freq: Every day | ORAL | 6 refills | Status: DC

## 2017-01-14 NOTE — Progress Notes (Signed)
Name: Nicholas Monroe   MRN: [id number]    DOB: Mar 31, 1941   Date:01/14/2017       Progress Note  Subjective  Chief Complaint  Chief Complaint  Patient presents with  . Hyperlipidemia  . Hypertension    Hyperlipidemia  This is a chronic problem. The problem is controlled. Recent lipid tests were reviewed and are normal. He has no history of chronic renal disease, diabetes, hypothyroidism, liver disease, obesity or nephrotic syndrome. There are no known factors aggravating his hyperlipidemia. Pertinent negatives include no chest pain, focal sensory loss, focal weakness, leg pain, myalgias or shortness of breath. Current antihyperlipidemic treatment includes fibric acid derivatives (omega 3). The current treatment provides mild improvement of lipids. There are no compliance problems.  There are no known risk factors for coronary artery disease.  Hypertension  This is a chronic problem. The current episode started more than 1 year ago. The problem has been gradually improving since onset. The problem is controlled. Pertinent negatives include no blurred vision, chest pain, headaches, malaise/fatigue, neck pain, palpitations or shortness of breath. There are no associated agents to hypertension. Risk factors for coronary artery disease include dyslipidemia, male gender and post-menopausal state. Past treatments include beta blockers and ACE inhibitors. The current treatment provides moderate improvement. There are no compliance problems.  There is no history of angina, kidney disease, CAD/MI, CVA, heart failure, left ventricular hypertrophy, PVD, renovascular disease or retinopathy. There is no history of chronic renal disease or a hypertension causing med.    No problem-specific Assessment & Plan notes found for this encounter.   Past Medical History:  Diagnosis Date  . Hyperlipidemia   . Hypertension     Past Surgical History:  Procedure Laterality Date  . APPENDECTOMY    . COLONOSCOPY   2011   normal- Dr Bary Castilla  . HERNIA REPAIR    . 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 Hypertension Father   . Diabetes Brother     Social History   Social History  . Marital status: Married    Spouse name: N/A  . Number of children: N/A  . Years of education: N/A   Occupational History  . Not on file.   Social History Main Topics  . Smoking status: Never Smoker  . Smokeless tobacco: Never Used  . Alcohol use No  . Drug use: No  . Sexual activity: Not Currently   Other Topics Concern  . Not on file   Social History Narrative  . No narrative on file    No Known Allergies   Review of Systems  Constitutional: Negative for chills, fever, malaise/fatigue and weight loss.  HENT: Negative for ear discharge, ear pain and sore throat.   Eyes: Negative for blurred vision.  Respiratory: Negative for cough, sputum production, shortness of breath and wheezing.   Cardiovascular: Negative for chest pain, palpitations and leg swelling.  Gastrointestinal: Negative for abdominal pain, blood in stool, constipation, diarrhea, heartburn, melena and nausea.  Genitourinary: Negative for dysuria, frequency, hematuria and urgency.       Nocturia  Musculoskeletal: Negative for back pain, joint pain, myalgias and neck pain.  Skin: Negative for rash.  Neurological: Negative for dizziness, tingling, sensory change, focal weakness and headaches.  Endo/Heme/Allergies: Negative for environmental allergies and polydipsia. Does not bruise/bleed easily.  Psychiatric/Behavioral: Negative for depression and suicidal ideas. The patient is not nervous/anxious and does not have insomnia.      Objective  Vitals:   01/14/17 0833  BP: 124/80  Pulse: 60  Weight: 186 lb (84.4 kg)  Height: 5\' 11"  (1.803 m)     Physical Exam  Constitutional: He is oriented to person, place, and time and well-developed, well-nourished, and in no distress.  HENT:  Head: Normocephalic.  Right Ear: External ear normal.  Left Ear: External ear normal.  Nose: Nose normal.  Mouth/Throat: Oropharynx is clear and moist.  Eyes: Conjunctivae and EOM are normal. Pupils are equal, round, and reactive to light. Right eye exhibits no discharge. Left eye exhibits no discharge. No scleral icterus.  Fundoscopic exam:      The right eye shows no arteriolar narrowing, no AV nicking and no papilledema.       The left eye shows no arteriolar narrowing, no AV nicking and no papilledema.  Neck: Normal range of motion. Neck supple. No JVD present. No tracheal deviation present. No thyromegaly present.  Cardiovascular: Normal rate, regular rhythm and intact distal pulses.  Exam reveals no gallop, no S3, no S4 and no friction rub.   No murmur heard. Pulmonary/Chest: Breath sounds normal. No respiratory distress. He has no wheezes. He has no rales.  Abdominal: Soft. Bowel sounds are normal. He exhibits no mass. There is no hepatosplenomegaly. There is no tenderness. There is no rebound, no guarding and no CVA tenderness.  Genitourinary: Rectum normal and prostate normal. Rectal exam shows guaiac negative stool.  Musculoskeletal: Normal range of motion. He exhibits no edema or tenderness.  Lymphadenopathy:    He has no cervical adenopathy.  Neurological: He is alert and oriented to person, place, and time. He has normal sensation, normal strength, normal reflexes and intact cranial nerves. No cranial nerve deficit.  Skin: Skin is warm. No rash noted.  Psychiatric: Mood and affect normal.  Nursing note and vitals reviewed.     Assessment & Plan  Problem List Items Addressed This Visit      Cardiovascular and Mediastinum   Essential hypertension - Primary   Relevant Medications   lisinopril (PRINIVIL,ZESTRIL) 10 MG tablet    atenolol (TENORMIN) 50 MG tablet   fenofibrate (TRICOR) 145 MG tablet   omega-3 acid ethyl esters (LOVAZA) 1 g capsule     Other   Hyperlipidemia   Relevant Medications   lisinopril (PRINIVIL,ZESTRIL) 10 MG tablet   atenolol (TENORMIN) 50 MG tablet   fenofibrate (TRICOR) 145 MG tablet   omega-3 acid ethyl esters (LOVAZA) 1 g capsule   Other Relevant Orders   Lipid Profile    Other Visit Diagnoses    Hyperkalemia       Relevant Orders   Potassium   Nocturia       Relevant Orders   PSA   Immunization due       Relevant Orders   Pneumococcal conjugate vaccine 13-valent IM (Completed)   Screen for colon cancer       Relevant Orders   POCT occult blood stool (Completed)     I spent 30 minutes with this patient, More than 50% of that time was spent in face to face education, counseling and care coordination.   Dr. Mac[facility]  01/14/17

## 2017-01-15 LAB — LIPID PANEL
CHOLESTEROL TOTAL: 164 mg/dL (ref 100–199)
Chol/HDL Ratio: 5.7 ratio units — ABNORMAL HIGH (ref 0.0–5.0)
HDL: 29 mg/dL — AB (ref >39)
LDL Calculated: 102 mg/dL — ABNORMAL HIGH (ref 0–99)
TRIGLYCERIDES: 164 mg/dL — AB (ref 0–149)
VLDL Cholesterol Cal: 33 mg/dL (ref 5–40)

## 2017-01-15 LAB — PSA: Prostate Specific Ag, Serum: 1.5 ng/mL (ref 0.0–4.0)

## 2017-01-15 LAB — POTASSIUM: Potassium: 5 mmol/L (ref 3.5–5.2)

## 2017-05-04 ENCOUNTER — Other Ambulatory Visit: Payer: Self-pay | Admitting: Family Medicine

## 2017-05-04 DIAGNOSIS — E785 Hyperlipidemia, unspecified: Secondary | ICD-10-CM

## 2017-06-01 ENCOUNTER — Other Ambulatory Visit: Payer: Self-pay | Admitting: Family Medicine

## 2017-06-01 DIAGNOSIS — I10 Essential (primary) hypertension: Secondary | ICD-10-CM

## 2017-06-01 DIAGNOSIS — E785 Hyperlipidemia, unspecified: Secondary | ICD-10-CM

## 2017-07-06 ENCOUNTER — Other Ambulatory Visit: Payer: Self-pay | Admitting: Family Medicine

## 2017-07-06 DIAGNOSIS — I10 Essential (primary) hypertension: Secondary | ICD-10-CM

## 2017-07-15 ENCOUNTER — Other Ambulatory Visit: Payer: Self-pay | Admitting: Family Medicine

## 2017-07-15 ENCOUNTER — Encounter: Payer: Self-pay | Admitting: Family Medicine

## 2017-07-15 ENCOUNTER — Ambulatory Visit (INDEPENDENT_AMBULATORY_CARE_PROVIDER_SITE_OTHER): Payer: Medicare Other | Admitting: Family Medicine

## 2017-07-15 VITALS — BP 126/70 | HR 60 | Temp 99.1°F | Ht 71.0 in | Wt 186.0 lb

## 2017-07-15 DIAGNOSIS — J0100 Acute maxillary sinusitis, unspecified: Secondary | ICD-10-CM

## 2017-07-15 DIAGNOSIS — E782 Mixed hyperlipidemia: Secondary | ICD-10-CM | POA: Diagnosis not present

## 2017-07-15 DIAGNOSIS — I10 Essential (primary) hypertension: Secondary | ICD-10-CM | POA: Diagnosis not present

## 2017-07-15 DIAGNOSIS — J40 Bronchitis, not specified as acute or chronic: Secondary | ICD-10-CM

## 2017-07-15 DIAGNOSIS — E785 Hyperlipidemia, unspecified: Secondary | ICD-10-CM | POA: Diagnosis not present

## 2017-07-15 MED ORDER — LISINOPRIL 10 MG PO TABS: 10.0000 mg | ORAL_TABLET | Freq: Every day | ORAL | 1 refills | Status: DC

## 2017-07-15 MED ORDER — DOXYCYCLINE HYCLATE 100 MG PO TABS: 100.0000 mg | ORAL_TABLET | Freq: Two times a day (BID) | ORAL | 0 refills | Status: DC

## 2017-07-15 MED ORDER — FENOFIBRATE 145 MG PO TABS: 72.5000 mg | ORAL_TABLET | Freq: Every day | ORAL | 1 refills | Status: DC

## 2017-07-15 MED ORDER — LOVAZA 1 G PO CAPS: ORAL_CAPSULE | ORAL | 1 refills | Status: DC

## 2017-07-15 MED ORDER — GUAIFENESIN-CODEINE 100-10 MG/5ML PO SYRP: 5.0000 mL | ORAL_SOLUTION | Freq: Three times a day (TID) | ORAL | 0 refills | Status: DC | PRN

## 2017-07-15 MED ORDER — ATENOLOL 50 MG PO TABS: 50.0000 mg | ORAL_TABLET | Freq: Two times a day (BID) | ORAL | 1 refills | Status: DC

## 2017-07-15 NOTE — Progress Notes (Signed)
Name: Nicholas Monroe   MRN: [id number]    DOB: 1941-08-14   Date:07/15/2017       Progress Note  Subjective  Chief Complaint  Chief Complaint  Patient presents with  . Hypertension  . Hyperlipidemia  . Sinusitis    nasal congestion and head congestion.     Hypertension  This is a chronic problem. The current episode started more than 1 year ago. The problem has been gradually improving since onset. The problem is controlled. Pertinent negatives include no anxiety, blurred vision, chest pain, headaches, malaise/fatigue, neck pain, orthopnea, palpitations, peripheral edema, PND, shortness of breath or sweats. There are no associated agents to hypertension. There are no known risk factors for coronary artery disease. Past treatments include ACE inhibitors and beta blockers. The current treatment provides mild improvement. There are no compliance problems.  There is no history of angina, kidney disease, CAD/MI, CVA, heart failure, left ventricular hypertrophy, PVD or retinopathy. There is no history of chronic renal disease, a hypertension causing med or renovascular disease.  Hyperlipidemia  This is a chronic problem. The current episode started more than 1 year ago. The problem is controlled. Recent lipid tests were reviewed and are normal. He has no history of chronic renal disease, diabetes, hypothyroidism, liver disease or obesity. Factors aggravating his hyperlipidemia include beta blockers. Pertinent negatives include no chest pain, focal sensory loss, focal weakness, leg pain, myalgias or shortness of breath. Current antihyperlipidemic treatment includes statins. The current treatment provides moderate improvement of lipids. There are no compliance problems.  Risk factors for coronary artery disease include post-menopausal, male sex, hypertension and dyslipidemia.  Sinusitis  This is a chronic problem. The current episode started more than 1 year ago. The problem has been gradually improving  since onset. The maximum temperature recorded prior to his arrival was 100.4 - 100.9 F. The fever has been present for less than 1 day. The pain is moderate. Associated symptoms include chills, congestion, coughing and a hoarse voice. Pertinent negatives include no diaphoresis, ear pain, headaches, neck pain, shortness of breath, sinus pressure, sneezing, sore throat or swollen glands. Past treatments include acetaminophen. The treatment provided moderate relief.  Cough  This is a new problem. The current episode started in the past 7 days. The problem has been waxing and waning. The cough is productive of sputum. Associated symptoms include chills. Pertinent negatives include no chest pain, ear pain, fever, headaches, heartburn, myalgias, rash, sore throat, shortness of breath, sweats, weight loss or wheezing. There is no history of environmental allergies.    No problem-specific Assessment & Plan notes found for this encounter.   Past Medical History:  Diagnosis Date  . Hyperlipidemia   . Hypertension     Past Surgical History:  Procedure Laterality Date  . APPENDECTOMY    . COLONOSCOPY  2011   normal- Dr Bary Castilla  . HERNIA REPAIR    . 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 Hypertension Father   . Diabetes Brother     Social History   Social History  . Marital status: Married    Spouse name: N/A  . Number of children: N/A  . Years of education: N/A   Occupational History  . Not on file.   Social History Main Topics  . Smoking status: Never Smoker  .  Smokeless tobacco: Never Used  . Alcohol use No  . Drug use: No  . Sexual activity: Not Currently   Other Topics Concern  . Not on file   Social History Narrative  . No narrative on file    No Known Allergies  Outpatient Medications  Prior to Visit  Medication Sig Dispense Refill  . aspirin EC 81 MG tablet Take 81 mg by mouth daily.    . folic acid (FOLVITE) 1 MG tablet Take 1 mg by mouth daily. Pt does not take on Saturday.- Dr Manuella Ghazi    . methotrexate (RHEUMATREX) 2.5 MG tablet Take 10 mg by mouth once a week. Pt takes on Saturday. Dr Manuella Ghazi    . Multiple Vitamins-Minerals (CENTRUM SILVER PO) Take 1 tablet by mouth daily.    Marland Kitchen atenolol (TENORMIN) 50 MG tablet TAKE ONE TABLET BY MOUTH TWICE DAILY. 60 tablet 0  . fenofibrate (TRICOR) 145 MG tablet Take 0.5 tablets (72.5 mg total) by mouth at bedtime. 30 tablet 6  . lisinopril (PRINIVIL,ZESTRIL) 10 MG tablet Take 1 tablet (10 mg total) by mouth daily. 30 tablet 6  . LOVAZA 1 g capsule TAKE (2) CAPSULES BY MOUTH TWICE DAILY 120 capsule 0   No facility-administered medications prior to visit.     Review of Systems  Constitutional: Positive for chills. Negative for diaphoresis, fever, malaise/fatigue and weight loss.  HENT: Positive for congestion and hoarse voice. Negative for ear discharge, ear pain, sinus pressure, sneezing and sore throat.   Eyes: Negative for blurred vision.  Respiratory: Positive for cough. Negative for sputum production, shortness of breath and wheezing.   Cardiovascular: Negative for chest pain, palpitations, orthopnea, leg swelling and PND.  Gastrointestinal: Negative for abdominal pain, blood in stool, constipation, diarrhea, heartburn, melena and nausea.  Genitourinary: Negative for dysuria, frequency, hematuria and urgency.  Musculoskeletal: Negative for back pain, joint pain, myalgias and neck pain.  Skin: Negative for rash.  Neurological: Negative for dizziness, tingling, sensory change, focal weakness and headaches.  Endo/Heme/Allergies: Negative for environmental allergies and polydipsia. Does not bruise/bleed easily.  Psychiatric/Behavioral: Negative for depression and suicidal ideas. The patient is not nervous/anxious and does not have  insomnia.      Objective  Vitals:   07/15/17 1033  BP: 126/70  Pulse: 60  Temp: 99.1 F (37.3 C)  Weight: 186 lb (84.4 kg)  Height: 5\' 11"  (1.803 m)    Physical Exam  Constitutional: He is oriented to person, place, and time and well-developed, well-nourished, and in no distress.  HENT:  Head: Normocephalic.  Right Ear: External ear normal.  Left Ear: External ear normal.  Nose: Nose normal.  Mouth/Throat: Oropharynx is clear and moist.  Eyes: Pupils are equal, round, and reactive to light. Conjunctivae and EOM are normal. Right eye exhibits no discharge. Left eye exhibits no discharge. No scleral icterus.  Neck: Normal range of motion. Neck supple. No JVD present. No tracheal deviation present. No thyromegaly present.  Cardiovascular: Normal rate, regular rhythm, normal heart sounds and intact distal pulses.  Exam reveals no gallop and no friction rub.   No murmur heard. Pulmonary/Chest: Breath sounds normal. No respiratory distress. He has no wheezes. He has no rales.  Abdominal: Soft. Bowel sounds are normal. He exhibits no mass. There is no hepatosplenomegaly. There is no tenderness. There is no rebound, no guarding and no CVA tenderness.  Musculoskeletal: Normal range of motion. He exhibits no edema or tenderness.  Lymphadenopathy:    He has no cervical adenopathy.  Neurological: He is alert and oriented to person, place, and time. He has normal sensation, normal strength, normal reflexes and intact cranial nerves. No cranial nerve deficit.  Skin: Skin is warm. No rash noted.  Psychiatric: Mood and affect normal.  Nursing note and vitals reviewed.     Assessment & Plan  Problem List Items Addressed This Visit      Cardiovascular and Mediastinum   Essential hypertension - Primary   Relevant Medications   atenolol (TENORMIN) 50 MG tablet   lisinopril (PRINIVIL,ZESTRIL) 10 MG tablet   LOVAZA 1 g capsule   fenofibrate (TRICOR) 145 MG tablet   Other Relevant Orders    Renal Function Panel     Other   Hyperlipidemia   Relevant Medications   atenolol (TENORMIN) 50 MG tablet   lisinopril (PRINIVIL,ZESTRIL) 10 MG tablet   LOVAZA 1 g capsule   fenofibrate (TRICOR) 145 MG tablet   Other Relevant Orders   Lipid Profile    Other Visit Diagnoses    Acute maxillary sinusitis, recurrence not specified       Relevant Medications   doxycycline (VIBRA-TABS) 100 MG tablet   guaiFENesin-codeine (ROBITUSSIN AC) 100-10 MG/5ML syrup   Bronchitis       Relevant Medications   doxycycline (VIBRA-TABS) 100 MG tablet   guaiFENesin-codeine (ROBITUSSIN AC) 100-10 MG/5ML syrup      Meds ordered this encounter  Medications  . atenolol (TENORMIN) 50 MG tablet    Sig: Take 1 tablet (50 mg total) by mouth 2 (two) times daily.    Dispense:  180 tablet    Refill:  1    sched med refill appt  . lisinopril (PRINIVIL,ZESTRIL) 10 MG tablet    Sig: Take 1 tablet (10 mg total) by mouth daily.    Dispense:  90 tablet    Refill:  1  . LOVAZA 1 g capsule    Sig: TAKE (2) CAPSULES BY MOUTH TWICE DAILY    Dispense:  180 capsule    Refill:  1  . fenofibrate (TRICOR) 145 MG tablet    Sig: Take 0.5 tablets (72.5 mg total) by mouth at bedtime.    Dispense:  90 tablet    Refill:  1  . doxycycline (VIBRA-TABS) 100 MG tablet    Sig: Take 1 tablet (100 mg total) by mouth 2 (two) times daily.    Dispense:  20 tablet    Refill:  0  . guaiFENesin-codeine (ROBITUSSIN AC) 100-10 MG/5ML syrup    Sig: Take 5 mLs by mouth 3 (three) times daily as needed for cough.    Dispense:  100 mL    Refill:  0      Dr. Otilio Miu [facility]  07/15/17

## 2017-07-16 LAB — RENAL FUNCTION PANEL
ALBUMIN: 4.7 g/dL (ref 3.5–4.8)
BUN/Creatinine Ratio: 12 (ref 10–24)
BUN: 14 mg/dL (ref 8–27)
CO2: 21 mmol/L (ref 20–29)
Calcium: 9.4 mg/dL (ref 8.6–10.2)
Chloride: 99 mmol/L (ref 96–106)
Creatinine, Ser: 1.13 mg/dL (ref 0.76–1.27)
GFR calc Af Amer: 73 mL/min/{1.73_m2} (ref >59)
GFR, EST NON AFRICAN AMERICAN: 63 mL/min/{1.73_m2} (ref >59)
GLUCOSE: 84 mg/dL (ref 65–99)
PHOSPHORUS: 2.3 mg/dL — AB (ref 2.5–4.5)
Potassium: 4.2 mmol/L (ref 3.5–5.2)
Sodium: 138 mmol/L (ref 134–144)

## 2017-07-16 LAB — LIPID PANEL
CHOLESTEROL TOTAL: 138 mg/dL (ref 100–199)
Chol/HDL Ratio: 3.8 ratio (ref 0.0–5.0)
HDL: 36 mg/dL — AB (ref >39)
LDL CALC: 87 mg/dL (ref 0–99)
TRIGLYCERIDES: 75 mg/dL (ref 0–149)
VLDL Cholesterol Cal: 15 mg/dL (ref 5–40)

## 2017-10-06 ENCOUNTER — Other Ambulatory Visit: Payer: Self-pay

## 2017-10-23 ENCOUNTER — Ambulatory Visit (INDEPENDENT_AMBULATORY_CARE_PROVIDER_SITE_OTHER): Payer: Medicare Other

## 2017-10-23 DIAGNOSIS — Z23 Encounter for immunization: Secondary | ICD-10-CM | POA: Diagnosis not present

## 2017-12-30 ENCOUNTER — Ambulatory Visit (INDEPENDENT_AMBULATORY_CARE_PROVIDER_SITE_OTHER): Payer: Medicare Other | Admitting: Family Medicine

## 2017-12-30 ENCOUNTER — Ambulatory Visit (INDEPENDENT_AMBULATORY_CARE_PROVIDER_SITE_OTHER): Payer: Medicare Other

## 2017-12-30 ENCOUNTER — Encounter: Payer: Self-pay | Admitting: Family Medicine

## 2017-12-30 VITALS — BP 128/70 | HR 80 | Ht 71.0 in | Wt 187.0 lb

## 2017-12-30 VITALS — BP 128/70 | HR 60 | Temp 97.8°F | Resp 12 | Ht 71.0 in | Wt 187.0 lb

## 2017-12-30 DIAGNOSIS — E782 Mixed hyperlipidemia: Secondary | ICD-10-CM

## 2017-12-30 DIAGNOSIS — Z0000 Encounter for general adult medical examination without abnormal findings: Secondary | ICD-10-CM

## 2017-12-30 DIAGNOSIS — Z1211 Encounter for screening for malignant neoplasm of colon: Secondary | ICD-10-CM

## 2017-12-30 DIAGNOSIS — E785 Hyperlipidemia, unspecified: Secondary | ICD-10-CM | POA: Diagnosis not present

## 2017-12-30 DIAGNOSIS — R351 Nocturia: Secondary | ICD-10-CM | POA: Diagnosis not present

## 2017-12-30 DIAGNOSIS — I10 Essential (primary) hypertension: Secondary | ICD-10-CM

## 2017-12-30 LAB — POCT URINALYSIS DIPSTICK
BILIRUBIN UA: NEGATIVE
Blood, UA: NEGATIVE
Glucose, UA: NEGATIVE
KETONES UA: NEGATIVE
Leukocytes, UA: NEGATIVE
NITRITE UA: NEGATIVE
Protein, UA: NEGATIVE
Spec Grav, UA: 1.01 (ref 1.010–1.025)
Urobilinogen, UA: 0.2 E.U./dL
pH, UA: 7 (ref 5.0–8.0)

## 2017-12-30 LAB — HEMOCCULT GUIAC POC 1CARD (OFFICE): FECAL OCCULT BLD: NEGATIVE

## 2017-12-30 MED ORDER — FENOFIBRATE 145 MG PO TABS: 72.5000 mg | ORAL_TABLET | Freq: Every day | ORAL | 1 refills | Status: DC

## 2017-12-30 MED ORDER — LOVAZA 1 G PO CAPS: ORAL_CAPSULE | ORAL | 1 refills | Status: DC

## 2017-12-30 MED ORDER — ATENOLOL 50 MG PO TABS: 50.0000 mg | ORAL_TABLET | Freq: Two times a day (BID) | ORAL | 1 refills | Status: DC

## 2017-12-30 MED ORDER — LISINOPRIL 10 MG PO TABS: 10.0000 mg | ORAL_TABLET | Freq: Every day | ORAL | 1 refills | Status: DC

## 2017-12-30 NOTE — Progress Notes (Signed)
Subjective:   Nicholas Monroe is a 77 y.o. male who presents for Medicare Annual/Subsequent preventive examination.  Review of Systems:  N/A Cardiac Risk Factors include: advanced age (>56men, >33 women);dyslipidemia;hypertension;male gender     Objective:    Vitals: BP 128/70 (BP Location: Right Arm, Patient Position: Sitting, Cuff Size: Normal)   Pulse 60   Temp 97.8 F (36.6 C) (Oral)   Resp 12   Ht 5\' 11"  (1.803 m)   Wt 187 lb (84.8 kg)   BMI 26.08 kg/m   Body mass index is 26.08 kg/m.  Advanced Directives 12/30/2017 12/29/2016 09/27/2015 07/05/2015  Does Patient Have a Medical Advance Directive? Yes Yes Yes No  Type of Industrial/product designer of [place];Living will -  Does patient want to make changes to medical advance directive? Yes (MAU/Ambulatory/Procedural Areas - Information given) - - -  Copy of [place] in Chart? No - copy requested - No - copy requested -  Would patient like information on creating a medical advance directive? - No - Patient declined - No - patient declined information    Tobacco Social History   Tobacco Use  Smoking Status Never Smoker  Smokeless Tobacco Never Used  Tobacco Comment   smoking cessation materials not required     Counseling given: No Comment: smoking cessation materials not required   Clinical Intake:  Pre-visit preparation completed: Yes  Pain : No/denies pain     BMI - recorded: 26.08 Nutritional Status: BMI 25 -29 Overweight Nutritional Risks: None Diabetes: No  How often do you need to have someone help you when you read instructions, pamphlets, or other written materials from your doctor or pharmacy?: 1 - Never  Interpreter Needed?: No  Information entered by :: AEversole, LPN  Past Medical History:  Diagnosis Date  . Hyperlipidemia   . Hypertension    Past Surgical History:  Procedure Laterality Date  .  APPENDECTOMY    . COLONOSCOPY  2011   normal- Dr Bary Castilla  . HERNIA REPAIR    . 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 Healthy Father   . Diabetes Brother   . Hypertension Mother   . Diabetes Daughter   . Diabetes Brother   . Stroke Brother    Social History   Socioeconomic History  . Marital status: Married    Spouse name: None  . Number of children: 2  . Years of education: None  . Highest education level: Master's degree (e.g., MA, MS, MEng, MEd, MSW, MBA)  Social Needs  . Financial resource strain: Not hard at all  . Food insecurity - worry: Never true  . Food insecurity - inability: Never true  . Transportation needs - medical: No  . Transportation needs - non-medical: No  Occupational History  . Occupation: Retired  Tobacco Use  . Smoking status: Never Smoker  . Smokeless tobacco: Never Used  . Tobacco comment: smoking cessation materials not required  Substance and Sexual Activity  . Alcohol use: No  . Drug use: No  . Sexual activity: Not Currently  Other Topics Concern  . None  Social History Narrative  . None    Outpatient Encounter Medications as of 12/30/2017  Medication Sig  . aspirin EC 81 MG tablet  Take 81 mg by mouth daily.  Marland Kitchen atenolol (TENORMIN) 50 MG tablet Take 1 tablet (50 mg total) by mouth 2 (two) times daily.  . fenofibrate (TRICOR) 145 MG tablet Take 0.5 tablets (72.5 mg total) by mouth at bedtime.  . folic acid (FOLVITE) 1 MG tablet Take 1 mg by mouth daily. Pt does not take on Saturday.- Dr Manuella Ghazi  . lisinopril (PRINIVIL,ZESTRIL) 10 MG tablet Take 1 tablet (10 mg total) by mouth daily.  Marland Kitchen LOVAZA 1 g capsule TAKE (2) CAPSULES BY MOUTH TWICE DAILY  . methotrexate (RHEUMATREX) 2.5 MG tablet Take 10 mg by mouth once a week. Pt takes on Saturday. Dr Manuella Ghazi  . Multiple  Vitamins-Minerals (CENTRUM SILVER PO) Take 1 tablet by mouth daily.  Marland Kitchen doxycycline (VIBRA-TABS) 100 MG tablet Take 1 tablet (100 mg total) by mouth 2 (two) times daily.  Marland Kitchen guaiFENesin-codeine (ROBITUSSIN AC) 100-10 MG/5ML syrup Take 5 mLs by mouth 3 (three) times daily as needed for cough.   No facility-administered encounter medications on file as of 12/30/2017.     Activities of Daily Living In your present state of health, do you have any difficulty performing the following activities: 12/30/2017  Hearing? N  Comment denies wearing hearing aids  Vision? N  Comment denies wearing eyeglasses  Difficulty concentrating or making decisions? N  Walking or climbing stairs? N  Dressing or bathing? N  Doing errands, shopping? N  Preparing Food and eating ? N  Comment denies wearing dentures  Using the Toilet? N  In the past six months, have you accidently leaked urine? Y  Comment urgency  Do you have problems with loss of bowel control? N  Managing your Medications? N  Managing your Finances? N  Housekeeping or managing your Housekeeping? N  Some recent data might be hidden    Patient Care Team: Juline Patch, MD as PCP - General (Family Medicine) Vladimir Crofts, MD as Consulting Physician (Neurology)   Assessment:   This is a routine wellness examination for Nicholas Monroe.  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 Exercise 150 min/wk Moderate Activity     Recommend to exercise for at least 150 minutes per week.        Fall Risk Fall Risk  12/30/2017 07/15/2017 05/15/2016 09/27/2015  Falls in the past year? No No Yes Yes  Number falls in past yr: - - 1 1  Injury  with Fall? - - No Yes  Risk for fall due to : History of fall(s) - - -  Risk for fall due to: Comment painting and fell off ladder - - -  Follow up - - - Follow up appointment   Is the patient's home free of loose throw rugs in walkways, pet beds, electrical cords, etc?   Yes Does the patient have any grab bars in the bathroom? No  Does the patient use a shower chair when bathing? No Does the patient have any stairs in or around the home? Yes If so, are there any handrails?  Yes Does the patient have adequate lighting?  Yes Does the patient use a cane, walker or w/c? No Does the patient use of an elevated toilet seat? No  Timed Get Up and Go  Performed: Yes. Pt ambulated 10 feet within 7 sec. Gait slow, steady and without the use of an assistive device. No intervention required at this time. Fall risk prevention has been discussed.  Depression Screen PHQ 2/9 Scores 12/30/2017 07/15/2017 07/15/2017 05/15/2016  PHQ - 2 Score 0 0 0 0  PHQ- 9 Score - 1 - -    Cognitive Function     6CIT Screen 12/30/2017  What Year? 0 points  What month? 0 points  What time? 0 points  Count back from 20 0 points  Months in reverse 0 points  Repeat phrase 0 points  Total Score 0    Immunization History  Administered Date(s) Administered  . Influenza, High Dose Seasonal PF 10/23/2017  . Influenza,inj,Quad PF,6+ Mos 09/27/2015  . Pneumococcal Conjugate-13 01/14/2017    Qualifies for Shingles Vaccine? Yes. Due for Zostavax or Shingrix vaccine. Education has been provided regarding the importance of this vaccine. Pt has been advised to call his insurance company to determine his out of pocket expense. Advised he may also receive this vaccine at his local pharmacy or Health Dept. Verbalized acceptance and understanding.  Will be due for PPSV23 vaccine on or after 01/14/18. Declined my offer to administer today for insurance reasons. Education has been provided regarding the importance of this vaccine but still  declined. Pt has been advised to call his insurance company to determine his out of pocket expense. Advised he may also receive this vaccine at his local pharmacy or Health Dept. Verbalized acceptance and understanding.  Screening Tests Health Maintenance  Topic Date Due  . PNA vac Low Risk Adult (2 of 2 - PPSV23) 01/14/2018  . TETANUS/TDAP  01/14/2027  . INFLUENZA VACCINE  Completed   Cancer Screenings: Lung: Low Dose CT Chest recommended if Age 92-80 years, 30 pack-year currently smoking OR have quit w/in 15years. Patient does not qualify. NON-SMOKER Colorectal: FOBT completed 10/20/15. Repeat every year. Pt states colonoscopy completed by Dr. Bary Castilla in 2010. Unable to locate report. Pt requested to be referred back to Dr. Bary Castilla for screening colonoscopy. Referral placed in Epic today. Message sent to referral coordinator for scheduling purposes.  Additional Screenings: Hepatitis B/HIV/Syphillis: does not qualify Hepatitis C Screening: does not qualify    Plan:   I have personally reviewed and addressed the Medicare Annual Wellness questionnaire and have noted the following in the patient's chart:  A. Medical and social history B. Use of alcohol, tobacco or illicit drugs  C. Current medications and supplements D. Functional ability and status E.  Nutritional status F.  Physical activity G. Advance directives H. List of other physicians I.  Hospitalizations, surgeries, and ER visits in previous 12 months J.  [place] such as hearing and vision if needed, cognitive and depression L. Referrals and appointments - none  In addition, I have reviewed and discussed with patient certain preventive protocols, quality metrics, and best practice recommendations. A written personalized care plan for preventive services as well as general preventive health recommendations were provided to patient.  Signed,  Aleatha Borer, LPN Nurse Health Advisor  MD Recommendations: Will be  due for PPSV23 vaccine on or after 01/14/18. Declined my offer to administer today for insurance reasons. Education has been provided regarding the importance of this vaccine but still declined. Pt has been advised to call his insurance company to determine his out of pocket expense. Advised he may also receive this vaccine at his local pharmacy or Health Dept. Verbalized acceptance and understanding.  Due  for Zostavax or Shingrix vaccine. Education has been provided regarding the importance of this vaccine. Pt has been advised to call his insurance company to determine his out of pocket expense. Advised he may also receive this vaccine at his local pharmacy or Health Dept. Verbalized acceptance and understanding.  FOBT completed 10/20/15. Repeat every year. Pt states colonoscopy completed by Dr. Bary Castilla in 2010. Unable to locate report. Pt requested to be referred back to Dr. Bary Castilla for screening colonoscopy. Referral placed in Epic today. Message sent to referral coordinator for scheduling purposes.

## 2017-12-30 NOTE — Progress Notes (Signed)
Name: Nicholas Monroe   MRN: [id number]    DOB: [birth year]   Date:12/30/2017       Progress Note  Subjective  Chief Complaint  Chief Complaint  Patient presents with  . Hypertension  . Hyperlipidemia  . Nocturia    Hypertension  This is a chronic problem. The current episode started more than 1 year ago. The problem has been waxing and waning since onset. The problem is controlled. Pertinent negatives include no anxiety, blurred vision, chest pain, headaches, malaise/fatigue, neck pain, orthopnea, palpitations, peripheral edema, PND, shortness of breath or sweats. There are no associated agents to hypertension. Risk factors for coronary artery disease include post-menopausal state. The current treatment provides moderate improvement. There are no compliance problems.  There is no history of angina, kidney disease, CAD/MI, CVA, heart failure, left ventricular hypertrophy or PVD. There is no history of chronic renal disease, a hypertension causing med or renovascular disease.  Hyperlipidemia  This is a chronic problem. The current episode started more than 1 year ago. The problem is controlled. Recent lipid tests were reviewed and are normal. He has no history of chronic renal disease. There are no known factors aggravating his hyperlipidemia. Pertinent negatives include no chest pain, focal sensory loss, focal weakness, leg pain, myalgias or shortness of breath. Current antihyperlipidemic treatment includes fibric acid derivatives (omega 3). The current treatment provides moderate improvement of lipids. There are no compliance problems.  Risk factors for coronary artery disease include dyslipidemia.  Urinary Frequency   This is a chronic (nocturia) problem. The current episode started more than 1 year ago. The problem occurs intermittently. Associated symptoms include frequency and urgency. Pertinent negatives include no chills, discharge, flank pain, hematuria, hesitancy, nausea, sweats or  vomiting. Associated symptoms comments: Decrease stream. The treatment provided mild relief. There is no history of catheterization, kidney stones, recurrent UTIs, a single kidney, urinary stasis or a urological procedure.    No problem-specific Assessment & Plan notes found for this encounter.   Past Medical History:  Diagnosis Date  . Hyperlipidemia   . Hypertension     Past Surgical History:  Procedure Laterality Date  . APPENDECTOMY    . COLONOSCOPY  2011   normal- Dr Bary Castilla  . HERNIA REPAIR    . 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 Healthy Father   . Diabetes Brother   . Hypertension Mother   . Diabetes Daughter   . Diabetes Brother   . Stroke Brother     Social History   Socioeconomic History  . Marital status: Married    Spouse name: Not on file  . Number of children: 2  . Years of education: Not on file  . Highest education level: Master's degree (e.g., MA, MS, MEng, MEd, MSW, MBA)  Social Needs  . Financial resource strain: Not hard at all  . Food insecurity - worry: Never true  . Food insecurity - inability: Never true  . Transportation needs - medical: No  . Transportation needs - non-medical: No  Occupational History  . Occupation: Retired  Tobacco Use  . Smoking status: Never Smoker  . Smokeless tobacco: Never Used  . Tobacco comment: smoking cessation materials not required  Substance and Sexual Activity  . Alcohol use: No  . Drug use: No  . Sexual activity:  Not Currently  Other Topics Concern  . Not on file  Social History Narrative  . Not on file    No Known Allergies  Outpatient Medications Prior to Visit  Medication Sig Dispense Refill  . aspirin EC 81 MG tablet Take 81 mg by mouth daily.    Marland Kitchen doxycycline (VIBRA-TABS) 100 MG tablet Take 1 tablet (100  mg total) by mouth 2 (two) times daily. 20 tablet 0  . folic acid (FOLVITE) 1 MG tablet Take 1 mg by mouth daily. Pt does not take on Saturday.- Dr Manuella Ghazi    . guaiFENesin-codeine [facility]) 100-10 MG/5ML syrup Take 5 mLs by mouth 3 (three) times daily as needed for cough. 100 mL 0  . methotrexate (RHEUMATREX) 2.5 MG tablet Take 10 mg by mouth once a week. Pt takes on Saturday. Dr Manuella Ghazi    . Multiple Vitamins-Minerals (CENTRUM SILVER PO) Take 1 tablet by mouth daily.    Marland Kitchen atenolol (TENORMIN) 50 MG tablet Take 1 tablet (50 mg total) by mouth 2 (two) times daily. 180 tablet 1  . fenofibrate (TRICOR) 145 MG tablet Take 0.5 tablets (72.5 mg total) by mouth at bedtime. 90 tablet 1  . lisinopril (PRINIVIL,ZESTRIL) 10 MG tablet Take 1 tablet (10 mg total) by mouth daily. 90 tablet 1  . LOVAZA 1 g capsule TAKE (2) CAPSULES BY MOUTH TWICE DAILY 180 capsule 1   No facility-administered medications prior to visit.     Review of Systems  Constitutional: Negative for chills, fever, malaise/fatigue and weight loss.  HENT: Negative for ear discharge, ear pain and sore throat.   Eyes: Negative for blurred vision.  Respiratory: Negative for cough, sputum production, shortness of breath and wheezing.   Cardiovascular: Negative for chest pain, palpitations, orthopnea, leg swelling and PND.  Gastrointestinal: Negative for abdominal pain, blood in stool, constipation, diarrhea, heartburn, melena, nausea and vomiting.  Genitourinary: Positive for frequency and urgency. Negative for dysuria, flank pain, hematuria and hesitancy.  Musculoskeletal: Negative for back pain, joint pain, myalgias and neck pain.  Skin: Negative for rash.  Neurological: Negative for dizziness, tingling, sensory change, focal weakness and headaches.  Endo/Heme/Allergies: Negative for environmental allergies and polydipsia. Does not bruise/bleed easily.  Psychiatric/Behavioral: Negative for depression and suicidal ideas. The patient is not  nervous/anxious and does not have insomnia.      Objective  Vitals:   12/30/17 0855  BP: 128/70  Pulse: 80  Weight: 187 lb (84.8 kg)  Height: 5\' 11"  (1.803 m)    Physical Exam  Constitutional: He is oriented to person, place, and time and well-developed, well-nourished, and in no distress.  HENT:  Head: Normocephalic.  Right Ear: Tympanic membrane and external ear normal.  Left Ear: Tympanic membrane and external ear normal.  Nose: Nose normal.  Mouth/Throat: Uvula is midline, oropharynx is clear and moist and mucous membranes are normal.  Eyes: Conjunctivae and EOM are normal. Pupils are equal, round, and reactive to light. Right eye exhibits no discharge. Left eye exhibits no discharge. No scleral icterus.  Neck: Normal range of motion. Neck supple. No JVD present. No tracheal deviation present. No thyromegaly present.  Cardiovascular: Normal rate, regular rhythm, normal heart sounds and intact distal pulses. Exam reveals no gallop and no friction rub.  No murmur heard. Pulmonary/Chest: Breath sounds normal. No respiratory distress. He has no wheezes. He has no rales.  Abdominal: Soft. Bowel sounds are normal. He exhibits no mass. There is no hepatosplenomegaly. There is no tenderness. There is no rebound,  no guarding and no CVA tenderness.  Genitourinary: Rectum normal and prostate normal. Rectal exam shows guaiac negative stool.  Musculoskeletal: Normal range of motion. He exhibits no edema or tenderness.  Lymphadenopathy:    He has no cervical adenopathy.  Neurological: He is alert and oriented to person, place, and time. He has normal sensation, normal strength, normal reflexes and intact cranial nerves. No cranial nerve deficit.  Skin: Skin is warm. No rash noted.  Psychiatric: Mood and affect normal.  Nursing note and vitals reviewed.     Assessment & Plan  Problem List Items Addressed This Visit      Cardiovascular and Mediastinum   Essential hypertension -  Primary   Relevant Medications   lisinopril (PRINIVIL,ZESTRIL) 10 MG tablet   atenolol (TENORMIN) 50 MG tablet   LOVAZA 1 g capsule   fenofibrate (TRICOR) 145 MG tablet   Other Relevant Orders   Renal Function Panel   POCT urinalysis dipstick (Completed)     Other   Hyperlipidemia   Relevant Medications   lisinopril (PRINIVIL,ZESTRIL) 10 MG tablet   atenolol (TENORMIN) 50 MG tablet   LOVAZA 1 g capsule   fenofibrate (TRICOR) 145 MG tablet   Other Relevant Orders   Lipid panel   Lipid panel   Nocturia more than twice per night   Relevant Orders   PSA    Other Visit Diagnoses    Colon cancer screening       Relevant Orders   POCT occult blood stool (Completed)      Meds ordered this encounter  Medications  . lisinopril (PRINIVIL,ZESTRIL) 10 MG tablet    Sig: Take 1 tablet (10 mg total) by mouth daily.    Dispense:  90 tablet    Refill:  1  . atenolol (TENORMIN) 50 MG tablet    Sig: Take 1 tablet (50 mg total) by mouth 2 (two) times daily.    Dispense:  180 tablet    Refill:  1    sched med refill appt  . LOVAZA 1 g capsule    Sig: TAKE (2) CAPSULES BY MOUTH TWICE DAILY    Dispense:  180 capsule    Refill:  1  . fenofibrate (TRICOR) 145 MG tablet    Sig: Take 0.5 tablets (72.5 mg total) by mouth at bedtime.    Dispense:  90 tablet    Refill:  1      Dr. Otilio Miu [facility]  12/30/17

## 2017-12-30 NOTE — Patient Instructions (Signed)
Nicholas Monroe , Thank you for taking time to come for your Medicare Wellness Visit. I appreciate your ongoing commitment to your health goals. Please review the following plan we discussed and let me know if I can assist you in the future.   Screening recommendations/referrals: Colonoscopy: Referred to GI for screening colonoscopy. You will receive a call from our office regarding your appointment. Recommended yearly ophthalmology/optometry visit for glaucoma screening and checkup Recommended yearly dental visit for hygiene and checkup  Vaccinations: Influenza vaccine: Up to date Pneumococcal vaccine: Declined PPSV23 today. This will be due on or after 01/14/18. Tdap vaccine: Up to date Shingles vaccine: Declined. Please call your insurance company to determine your out of pocket expense. You may also receive this vaccine at your local pharmacy or Health Dept.  Advanced directives: Advance directive discussed with you today. I have provided a copy for you to complete at home and have notarized. Once this is complete please bring a copy in to our office so we can scan it into your chart.  Conditions/risks identified: Recommend to exercise for at least 150 minutes per week.  Next appointment: You are scheduled to see Dr. Ronnald Ramp on 12/30/17 @ 8:30am.   Please schedule your Annual Wellness Visit with your Nurse Health Advisor in one year.  Preventive Care 77 Years and Older, Male Preventive care refers to lifestyle choices and visits with your health care provider that can promote health and wellness. What does preventive care include?  A yearly physical exam. This is also called an annual well check.  Dental exams once or twice a year.  Routine eye exams. Ask your health care provider how often you should have your eyes checked.  Personal lifestyle choices, including:  Daily care of your teeth and gums.  Regular physical activity.  Eating a healthy diet.  Avoiding tobacco and drug  use.  Limiting alcohol use.  Practicing safe sex.  Taking low doses of aspirin every day.  Taking vitamin and mineral supplements as recommended by your health care provider. What happens during an annual well check? The services and screenings done by your health care provider during your annual well check will depend on your age, overall health, lifestyle risk factors, and family history of disease. Counseling  Your health care provider may ask you questions about your:  Alcohol use.  Tobacco use.  Drug use.  Emotional well-being.  Home and relationship well-being.  Sexual activity.  Eating habits.  History of falls.  Memory and ability to understand (cognition).  Work and work Statistician. Screening  You may have the following tests or measurements:  Height, weight, and BMI.  Blood pressure.  Lipid and cholesterol levels. These may be checked every 5 years, or more frequently if you are over 77 years old.  Skin check.  Lung cancer screening. You may have this screening every year starting at age 77 if you have a 30-pack-year history of smoking and currently smoke or have quit within the past 15 years.  Fecal occult blood test (FOBT) of the stool. You may have this test every year starting at age 77.  Flexible sigmoidoscopy or colonoscopy. You may have a sigmoidoscopy every 5 years or a colonoscopy every 10 years starting at age 77.  Prostate cancer screening. Recommendations will vary depending on your family history and other risks.  Hepatitis C blood test.  Hepatitis B blood test.  Sexually transmitted disease (STD) testing.  Diabetes screening. This is done by checking your blood sugar (glucose)  after you have not eaten for a while (fasting). You may have this done every 1-3 years.  Abdominal aortic aneurysm (AAA) screening. You may need this if you are a current or former smoker.  Osteoporosis. You may be screened starting at age 77 if you are at  high risk. Talk with your health care provider about your test results, treatment options, and if necessary, the need for more tests. Vaccines  Your health care provider may recommend certain vaccines, such as:  Influenza vaccine. This is recommended every year.  Tetanus, diphtheria, and acellular pertussis (Tdap, Td) vaccine. You may need a Td booster every 10 years.  Zoster vaccine. You may need this after age 77.  Pneumococcal 13-valent conjugate (PCV13) vaccine. One dose is recommended after age 77.  Pneumococcal polysaccharide (PPSV23) vaccine. One dose is recommended after age 77. Talk to your health care provider about which screenings and vaccines you need and how often you need them. This information is not intended to replace advice given to you by your health care provider. Make sure you discuss any questions you have with your health care provider. Document Released: 12/28/2015 Document Revised: 08/20/2016 Document Reviewed: 10/02/2015 Elsevier Interactive Patient Education  2017 [employer] Prevention in the Home Falls can cause injuries. They can happen to people of all ages. There are many things you can do to make your home safe and to help prevent falls. What can I do on the outside of my home?  Regularly fix the edges of walkways and driveways and fix any cracks.  Remove anything that might make you trip as you walk through a door, such as a raised step or threshold.  Trim any bushes or trees on the path to your home.  Use bright outdoor lighting.  Clear any walking paths of anything that might make someone trip, such as rocks or tools.  Regularly check to see if handrails are loose or broken. Make sure that both sides of any steps have handrails.  Any raised decks and porches should have guardrails on the edges.  Have any leaves, snow, or ice cleared regularly.  Use sand or salt on walking paths during winter.  Clean up any spills in your garage  right away. This includes oil or grease spills. What can I do in the bathroom?  Use night lights.  Install grab bars by the toilet and in the tub and shower. Do not use towel bars as grab bars.  Use non-skid mats or decals in the tub or shower.  If you need to sit down in the shower, use a plastic, non-slip stool.  Keep the floor dry. Clean up any water that spills on the floor as soon as it happens.  Remove soap buildup in the tub or shower regularly.  Attach bath mats securely with double-sided non-slip rug tape.  Do not have throw rugs and other things on the floor that can make you trip. What can I do in the bedroom?  Use night lights.  Make sure that you have a light by your bed that is easy to reach.  Do not use any sheets or blankets that are too big for your bed. They should not hang down onto the floor.  Have a firm chair that has side arms. You can use this for support while you get dressed.  Do not have throw rugs and other things on the floor that can make you trip. What can I do in the kitchen?  Clean up any spills right away.  Avoid walking on wet floors.  Keep items that you use a lot in easy-to-reach places.  If you need to reach something above you, use a strong step stool that has a grab bar.  Keep electrical cords out of the way.  Do not use floor polish or wax that makes floors slippery. If you must use wax, use non-skid floor wax.  Do not have throw rugs and other things on the floor that can make you trip. What can I do with my stairs?  Do not leave any items on the stairs.  Make sure that there are handrails on both sides of the stairs and use them. Fix handrails that are broken or loose. Make sure that handrails are as long as the stairways.  Check any carpeting to make sure that it is firmly attached to the stairs. Fix any carpet that is loose or worn.  Avoid having throw rugs at the top or bottom of the stairs. If you do have throw rugs,  attach them to the floor with carpet tape.  Make sure that you have a light switch at the top of the stairs and the bottom of the stairs. If you do not have them, ask someone to add them for you. What else can I do to help prevent falls?  Wear shoes that:  Do not have high heels.  Have rubber bottoms.  Are comfortable and fit you well.  Are closed at the toe. Do not wear sandals.  If you use a stepladder:  Make sure that it is fully opened. Do not climb a closed stepladder.  Make sure that both sides of the stepladder are locked into place.  Ask someone to hold it for you, if possible.  Clearly mark and make sure that you can see:  Any grab bars or handrails.  First and last steps.  Where the edge of each step is.  Use tools that help you move around (mobility aids) if they are needed. These include:  Canes.  Walkers.  Scooters.  Crutches.  Turn on the lights when you go into a dark area. Replace any light bulbs as soon as they burn out.  Set up your furniture so you have a clear path. Avoid moving your furniture around.  If any of your floors are uneven, fix them.  If there are any pets around you, be aware of where they are.  Review your medicines with your doctor. Some medicines can make you feel dizzy. This can increase your chance of falling. Ask your doctor what other things that you can do to help prevent falls. This information is not intended to replace advice given to you by your health care provider. Make sure you discuss any questions you have with your health care provider. Document Released: 09/27/2009 Document Revised: 05/08/2016 Document Reviewed: 01/05/2015 Elsevier Interactive Patient Education  2017 [employer].

## 2017-12-31 LAB — LIPID PANEL
CHOLESTEROL TOTAL: 141 mg/dL (ref 100–199)
Chol/HDL Ratio: 4 ratio (ref 0.0–5.0)
HDL: 35 mg/dL — ABNORMAL LOW (ref >39)
LDL CALC: 88 mg/dL (ref 0–99)
Triglycerides: 90 mg/dL (ref 0–149)
VLDL CHOLESTEROL CAL: 18 mg/dL (ref 5–40)

## 2017-12-31 LAB — RENAL FUNCTION PANEL
Albumin: 4.6 g/dL (ref 3.5–4.8)
BUN / CREAT RATIO: 17 (ref 10–24)
BUN: 16 mg/dL (ref 8–27)
CALCIUM: 9.5 mg/dL (ref 8.6–10.2)
CHLORIDE: 103 mmol/L (ref 96–106)
CO2: 22 mmol/L (ref 20–29)
Creatinine, Ser: 0.92 mg/dL (ref 0.76–1.27)
GFR calc non Af Amer: 81 mL/min/{1.73_m2} (ref >59)
GFR, EST AFRICAN AMERICAN: 93 mL/min/{1.73_m2} (ref >59)
Glucose: 93 mg/dL (ref 65–99)
Phosphorus: 2.8 mg/dL (ref 2.5–4.5)
Potassium: 5 mmol/L (ref 3.5–5.2)
SODIUM: 141 mmol/L (ref 134–144)

## 2017-12-31 LAB — PSA: PROSTATE SPECIFIC AG, SERUM: 1.4 ng/mL (ref 0.0–4.0)

## 2018-01-14 ENCOUNTER — Encounter: Payer: Self-pay | Admitting: General Surgery

## 2018-04-05 ENCOUNTER — Other Ambulatory Visit: Payer: Self-pay | Admitting: Family Medicine

## 2018-04-05 DIAGNOSIS — E785 Hyperlipidemia, unspecified: Secondary | ICD-10-CM

## 2018-05-05 ENCOUNTER — Other Ambulatory Visit: Payer: Self-pay | Admitting: Family Medicine

## 2018-05-05 DIAGNOSIS — E785 Hyperlipidemia, unspecified: Secondary | ICD-10-CM

## 2018-06-03 ENCOUNTER — Other Ambulatory Visit: Payer: Self-pay | Admitting: Family Medicine

## 2018-06-03 DIAGNOSIS — E785 Hyperlipidemia, unspecified: Secondary | ICD-10-CM

## 2018-06-28 ENCOUNTER — Other Ambulatory Visit: Payer: Self-pay | Admitting: Family Medicine

## 2018-06-28 DIAGNOSIS — E785 Hyperlipidemia, unspecified: Secondary | ICD-10-CM

## 2018-07-01 ENCOUNTER — Other Ambulatory Visit: Payer: Self-pay | Admitting: Family Medicine

## 2018-07-01 DIAGNOSIS — E785 Hyperlipidemia, unspecified: Secondary | ICD-10-CM

## 2018-07-06 ENCOUNTER — Ambulatory Visit: Payer: Medicare Other | Admitting: Family Medicine

## 2018-07-06 ENCOUNTER — Encounter: Payer: Self-pay | Admitting: Family Medicine

## 2018-07-06 VITALS — BP 120/78 | HR 59 | Resp 16 | Ht 71.0 in | Wt 186.0 lb

## 2018-07-06 DIAGNOSIS — E782 Mixed hyperlipidemia: Secondary | ICD-10-CM

## 2018-07-06 DIAGNOSIS — I10 Essential (primary) hypertension: Secondary | ICD-10-CM

## 2018-07-06 DIAGNOSIS — Z23 Encounter for immunization: Secondary | ICD-10-CM | POA: Diagnosis not present

## 2018-07-06 DIAGNOSIS — R69 Illness, unspecified: Secondary | ICD-10-CM

## 2018-07-06 LAB — POCT URINALYSIS DIPSTICK
Appearance: NORMAL
Bilirubin, UA: NEGATIVE
Blood, UA: NEGATIVE
Glucose, UA: NEGATIVE
KETONES UA: NEGATIVE
LEUKOCYTES UA: NEGATIVE
NITRITE UA: NEGATIVE
ODOR: NEGATIVE
PH UA: 7.5 (ref 5.0–8.0)
PROTEIN UA: NEGATIVE
SPEC GRAV UA: 1.01 (ref 1.010–1.025)
UROBILINOGEN UA: 0.2 U/dL

## 2018-07-06 MED ORDER — ATENOLOL 50 MG PO TABS: 50.0000 mg | ORAL_TABLET | Freq: Two times a day (BID) | ORAL | 1 refills | Status: DC

## 2018-07-06 MED ORDER — LOVAZA 1 G PO CAPS: ORAL_CAPSULE | ORAL | 6 refills | Status: DC

## 2018-07-06 MED ORDER — ASPIRIN EC 81 MG PO TBEC: 81.0000 mg | DELAYED_RELEASE_TABLET | Freq: Every day | ORAL | 3 refills | Status: DC

## 2018-07-06 MED ORDER — FENOFIBRATE 145 MG PO TABS: 72.5000 mg | ORAL_TABLET | Freq: Every day | ORAL | 1 refills | Status: DC

## 2018-07-06 MED ORDER — LISINOPRIL 10 MG PO TABS: 10.0000 mg | ORAL_TABLET | Freq: Every day | ORAL | 1 refills | Status: DC

## 2018-07-06 NOTE — Assessment & Plan Note (Signed)
Chronic Controlled Continue lisinopril 10 mg and atenolol 50 mg daily. Will check renal panel.

## 2018-07-06 NOTE — Progress Notes (Signed)
Name: Nicholas Monroe   MRN: [id number]    DOB: 07/11/1941   Date:07/06/2018       Progress Note  Subjective  Chief Complaint  Chief Complaint  Patient presents with  . Hypertension    Hypertension  This is a chronic problem. The current episode started more than 1 year ago. The problem is unchanged. The problem is controlled. Pertinent negatives include no anxiety, blurred vision, chest pain, headaches, malaise/fatigue, neck pain, orthopnea, palpitations, peripheral edema, PND, shortness of breath or sweats. There are no associated agents to hypertension. Risk factors for coronary artery disease include dyslipidemia. Past treatments include ACE inhibitors and calcium channel blockers. The current treatment provides mild improvement. There are no compliance problems.  There is no history of angina, kidney disease, CAD/MI, CVA, heart failure, left ventricular hypertrophy, PVD or retinopathy. There is no history of chronic renal disease, a hypertension causing med or renovascular disease.  Hyperlipidemia  This is a chronic problem. The current episode started more than 1 year ago. The problem is controlled. Recent lipid tests were reviewed and are normal. He has no history of chronic renal disease, diabetes, hypothyroidism, liver disease, obesity or nephrotic syndrome. There are no known factors aggravating his hyperlipidemia. Pertinent negatives include no chest pain, focal sensory loss, focal weakness, leg pain, myalgias or shortness of breath. Current antihyperlipidemic treatment includes statins and fibric acid derivatives. The current treatment provides moderate improvement of lipids. There are no compliance problems.  Risk factors for coronary artery disease include dyslipidemia, hypertension and male sex.    Essential hypertension Chronic Controlled Continue lisinopril 10 mg and atenolol 50 mg daily. Will check renal panel.  Hyperlipidemia Chronic Controlled Continue Lovasa 1 gm qid and  fenofibrate 145 mg daily. Check lipid panel   Past Medical History:  Diagnosis Date  . Hyperlipidemia   . Hypertension     Past Surgical History:  Procedure Laterality Date  . APPENDECTOMY    . COLONOSCOPY  2011   normal- Dr Bary Castilla  . HERNIA REPAIR    . 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 Healthy Father   . Diabetes Brother   . Hypertension Mother   . Diabetes Daughter   . Diabetes Brother   . Stroke Brother     Social History   Socioeconomic History  . Marital status: Married    Spouse name: Not on file  . Number of children: 2  . Years of education: Not on file  . Highest education level: Master's degree (e.g., MA, MS, MEng, MEd, MSW, MBA)  Occupational History  . Occupation: Retired  Scientific laboratory technician  . Financial resource strain: Not hard at all  . Food insecurity:    Worry: Never true    Inability: Never true  . Transportation needs:    Medical: No    Non-medical: No  Tobacco Use  . Smoking status: Never Smoker  . Smokeless tobacco: Never Used  . Tobacco comment: smoking cessation materials not required  Substance and Sexual Activity  . Alcohol use: No  . Drug use: No  . Sexual activity: Not Currently  Lifestyle  . Physical activity:    Days per week: 0 days    Minutes per session: 0 min  . Stress: Not at all  Relationships  . Social connections:  Talks on phone: Patient refused    Gets together: Patient refused    Attends religious service: Patient refused    Active member of club or organization: Patient refused    Attends meetings of clubs or organizations: Patient refused    Relationship status: Married  . Intimate partner violence:    Fear of current or ex partner: No    Emotionally abused: No    Physically abused: No    Forced sexual activity: No  Other  Topics Concern  . Not on file  Social History Narrative  . Not on file    No Known Allergies  Outpatient Medications Prior to Visit  Medication Sig Dispense Refill  . folic acid (FOLVITE) 1 MG tablet Take 1 mg by mouth daily. Pt does not take on Saturday.- Dr Manuella Ghazi    . methotrexate (RHEUMATREX) 2.5 MG tablet Take 10 mg by mouth once a week. Pt takes on Saturday. Dr Manuella Ghazi    . Multiple Vitamins-Minerals (CENTRUM SILVER PO) Take 1 tablet by mouth daily.    Marland Kitchen aspirin EC 81 MG tablet Take 81 mg by mouth daily.    Marland Kitchen atenolol (TENORMIN) 50 MG tablet Take 1 tablet (50 mg total) by mouth 2 (two) times daily. 180 tablet 1  . fenofibrate (TRICOR) 145 MG tablet Take 0.5 tablets (72.5 mg total) by mouth at bedtime. 90 tablet 1  . lisinopril (PRINIVIL,ZESTRIL) 10 MG tablet Take 1 tablet (10 mg total) by mouth daily. 90 tablet 1  . LOVAZA 1 g capsule TAKE TWO CAPSULES TWICE DAILY. 120 capsule 0  . doxycycline (VIBRA-TABS) 100 MG tablet Take 1 tablet (100 mg total) by mouth 2 (two) times daily. 20 tablet 0  . guaiFENesin-codeine (ROBITUSSIN AC) 100-10 MG/5ML syrup Take 5 mLs by mouth 3 (three) times daily as needed for cough. 100 mL 0   No facility-administered medications prior to visit.     Review of Systems  Constitutional: Negative for chills, fever, malaise/fatigue and weight loss.  HENT: Negative for ear discharge, ear pain and sore throat.   Eyes: Negative for blurred vision.  Respiratory: Negative for cough, sputum production, shortness of breath and wheezing.   Cardiovascular: Negative for chest pain, palpitations, orthopnea, leg swelling and PND.  Gastrointestinal: Negative for abdominal pain, blood in stool, constipation, diarrhea, heartburn, melena and nausea.  Genitourinary: Negative for dysuria, frequency, hematuria and urgency.  Musculoskeletal: Negative for back pain, joint pain, myalgias and neck pain.  Skin: Negative for rash.  Neurological: Negative for dizziness, tingling,  sensory change, focal weakness and headaches.  Endo/Heme/Allergies: Negative for environmental allergies and polydipsia. Does not bruise/bleed easily.  Psychiatric/Behavioral: Negative for depression and suicidal ideas. The patient is not nervous/anxious and does not have insomnia.      Objective  Vitals:   07/06/18 0813  BP: 120/78  Pulse: (!) 59  Resp: 16  SpO2: 98%  Weight: 186 lb (84.4 kg)  Height: 5\' 11"  (1.803 m)    Physical Exam  Constitutional: He is oriented to person, place, and time.  HENT:  Head: Normocephalic.  Right Ear: Hearing, tympanic membrane, external ear and ear canal normal.  Left Ear: Hearing, tympanic membrane, external ear and ear canal normal.  Nose: Nose normal.  Mouth/Throat: Uvula is midline and oropharynx is clear and moist. Normal dentition. No oropharyngeal exudate, posterior oropharyngeal edema or posterior oropharyngeal erythema.  Eyes: Pupils are equal, round, and reactive to light. Conjunctivae and EOM are normal. Right eye exhibits no discharge. Left eye exhibits no  discharge. No scleral icterus.  Neck: Normal range of motion. Neck supple. No JVD present. No tracheal deviation present. No thyromegaly present.  Cardiovascular: Normal rate, regular rhythm, normal heart sounds and intact distal pulses. Exam reveals no gallop and no friction rub.  No murmur heard. Pulmonary/Chest: Breath sounds normal. No respiratory distress. He has no wheezes. He has no rales.  Abdominal: Soft. Bowel sounds are normal. He exhibits no mass. There is no hepatosplenomegaly. There is no tenderness. There is no rebound, no guarding and no CVA tenderness.  Musculoskeletal: Normal range of motion. He exhibits no edema or tenderness.  Lymphadenopathy:    He has no cervical adenopathy.  Neurological: He is alert and oriented to person, place, and time. He has normal strength and normal reflexes. No cranial nerve deficit.  Skin: Skin is warm. No rash noted.  Nursing note  and vitals reviewed.     Assessment & Plan  Problem List Items Addressed This Visit      Cardiovascular and Mediastinum   Essential hypertension - Primary    Chronic Controlled Continue lisinopril 10 mg and atenolol 50 mg daily. Will check renal panel.      Relevant Medications   lisinopril (PRINIVIL,ZESTRIL) 10 MG tablet   atenolol (TENORMIN) 50 MG tablet   LOVAZA 1 g capsule   fenofibrate (TRICOR) 145 MG tablet   aspirin EC 81 MG tablet   Other Relevant Orders   POCT urinalysis dipstick (Completed)     Other   Hyperlipidemia    Chronic Controlled Continue Lovasa 1 gm qid and fenofibrate 145 mg daily. Check lipid panel      Relevant Medications   lisinopril (PRINIVIL,ZESTRIL) 10 MG tablet   atenolol (TENORMIN) 50 MG tablet   LOVAZA 1 g capsule   fenofibrate (TRICOR) 145 MG tablet   aspirin EC 81 MG tablet    Other Visit Diagnoses    Taking medication for chronic disease       On methotrexate. Will check cbc(marrow) and hepatic panel for toxicity.   Relevant Orders   Hepatic Function Panel (6)   CBC with Differential/Platelet   Need for 23-polyvalent pneumococcal polysaccharide vaccine       Patient due for pneumococcal 23   Relevant Orders   Pneumococcal polysaccharide vaccine 23-valent greater than or equal to 2yo subcutaneous/IM      Meds ordered this encounter  Medications  . lisinopril (PRINIVIL,ZESTRIL) 10 MG tablet    Sig: Take 1 tablet (10 mg total) by mouth daily.    Dispense:  90 tablet    Refill:  1  . atenolol (TENORMIN) 50 MG tablet    Sig: Take 1 tablet (50 mg total) by mouth 2 (two) times daily.    Dispense:  180 tablet    Refill:  1    sched med refill appt  . LOVAZA 1 g capsule    Sig: 2 capsules bid    Dispense:  120 capsule    Refill:  6  . fenofibrate (TRICOR) 145 MG tablet    Sig: Take 0.5 tablets (72.5 mg total) by mouth at bedtime.    Dispense:  90 tablet    Refill:  1  . aspirin EC 81 MG tablet    Sig: Take 1 tablet (81 mg  total) by mouth daily.    Dispense:  100 tablet    Refill:  3      Dr. Otilio Miu [facility]  07/06/18

## 2018-07-06 NOTE — Assessment & Plan Note (Signed)
Chronic Controlled Continue Lovasa 1 gm qid and fenofibrate 145 mg daily. Check lipid panel

## 2018-07-07 LAB — CBC WITH DIFFERENTIAL/PLATELET
BASOS: 1 %
Basophils Absolute: 0 10*3/uL (ref 0.0–0.2)
EOS (ABSOLUTE): 0.4 10*3/uL (ref 0.0–0.4)
EOS: 9 %
HEMATOCRIT: 39.2 % (ref 37.5–51.0)
HEMOGLOBIN: 12.6 g/dL — AB (ref 13.0–17.7)
Immature Grans (Abs): 0 10*3/uL (ref 0.0–0.1)
Immature Granulocytes: 1 %
LYMPHS ABS: 0.8 10*3/uL (ref 0.7–3.1)
Lymphs: 18 %
MCH: 31 pg (ref 26.6–33.0)
MCHC: 32.1 g/dL (ref 31.5–35.7)
MCV: 97 fL (ref 79–97)
MONOS ABS: 0.4 10*3/uL (ref 0.1–0.9)
Monocytes: 10 %
NEUTROS ABS: 2.7 10*3/uL (ref 1.4–7.0)
Neutrophils: 61 %
Platelets: 170 10*3/uL (ref 150–450)
RBC: 4.06 x10E6/uL — ABNORMAL LOW (ref 4.14–5.80)
RDW: 14.1 % (ref 12.3–15.4)
WBC: 4.4 10*3/uL (ref 3.4–10.8)

## 2018-07-07 LAB — HEPATIC FUNCTION PANEL (6)
ALBUMIN: 4.4 g/dL (ref 3.5–4.8)
ALK PHOS: 38 IU/L — AB (ref 39–117)
ALT: 17 IU/L (ref 0–44)
AST: 25 IU/L (ref 0–40)
BILIRUBIN TOTAL: 0.8 mg/dL (ref 0.0–1.2)
BILIRUBIN, DIRECT: 0.2 mg/dL (ref 0.00–0.40)

## 2018-07-30 ENCOUNTER — Ambulatory Visit
Admission: EM | Admit: 2018-07-30 | Discharge: 2018-07-30 | Disposition: A | Discharge status: Home / Self Care | Payer: Medicare Other | Attending: Family Medicine | Admitting: Family Medicine

## 2018-07-30 ENCOUNTER — Other Ambulatory Visit: Payer: Self-pay

## 2018-07-30 DIAGNOSIS — I83899 Varicose veins of unspecified lower extremities with other complications: Secondary | ICD-10-CM

## 2018-07-30 NOTE — ED Triage Notes (Signed)
Patient states that he had a little area near his ankle that started bleeding and bled for 10 minutes. Patient applied pressure and has stopped bleeding now. No injury to the area.

## 2018-07-30 NOTE — ED Provider Notes (Signed)
[facility]    CSN: [id number] Arrival date & time: 07/30/18  1657     History   Chief Complaint Chief Complaint  Patient presents with  . Bleeding/Bruising    HPI Nicholas Monroe is a 78 y.o. male.   77 yo male with a c/o bleeding from right ankle today after being on his feet working for a while. Denies any injury. 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 States he applied pressure for about 10 minutes and it stopped bleeding.   The history is provided by the patient.    Past Medical History:  Diagnosis Date  . Hyperlipidemia   . Hypertension     Patient Active Problem List   Diagnosis Date Noted  . Nocturia more than twice per night 12/30/2017  . Hyperlipidemia 05/15/2016  . Essential hypertension 05/15/2016  . Closed right hip fracture ([place]) 07/05/2015    Past Surgical History:  Procedure Laterality Date  . APPENDECTOMY    . COLONOSCOPY  2011   normal- Dr Bary Castilla  . HERNIA REPAIR    . 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 Authorizing Provider  aspirin EC 81 MG tablet Take 1 tablet (81 mg total) by mouth daily. 07/06/18  Yes Juline Patch, MD  atenolol (TENORMIN) 50 MG tablet Take 1 tablet (50 mg total) by mouth 2 (two) times daily. 07/06/18  Yes Juline Patch, MD  fenofibrate (TRICOR) 145 MG tablet Take 0.5 tablets (72.5 mg total) by mouth at bedtime. 07/06/18  Yes Juline Patch, MD  folic acid (FOLVITE) 1 MG tablet Take 1 mg by mouth daily. Pt does not take on Saturday.- Dr Manuella Ghazi   Yes [provider]  lisinopril (PRINIVIL,ZESTRIL) 10 MG tablet Take 1 tablet (10 mg total) by mouth daily. 07/06/18  Yes Juline Patch, MD  LOVAZA 1 g capsule 2 capsules bid  07/06/18  Yes Juline Patch, MD  methotrexate (RHEUMATREX) 2.5 MG tablet Take 10 mg by mouth once a week. Pt takes on Saturday. Dr Manuella Ghazi   Yes [provider]  Multiple Vitamins-Minerals (CENTRUM SILVER PO) Take 1 tablet by mouth daily.   Yes [provider]    Family History Family History  Problem Relation Age of Onset  . Healthy Father   . Diabetes Brother   . Hypertension Mother   . Diabetes Daughter   . Diabetes Brother   . Stroke Brother     Social History Social History   Tobacco Use  . Smoking status: Never Smoker  . Smokeless tobacco: Never Used  . Tobacco comment: smoking cessation materials not required  Substance Use Topics  . Alcohol use: No  . Drug use: No     Allergies   Patient has no known allergies.   Review of Systems Review of Systems   Physical Exam Triage Vital Signs ED Triage Vitals  Enc Vitals Group     BP 07/30/18 1709 133/66     Pulse Rate 07/30/18 1709 68     Resp 07/30/18 1709 18     Temp 07/30/18 1709 97.8 F (36.6 C)     Temp Source 07/30/18 1709 Oral  SpO2 07/30/18 1709 99 %     Weight 07/30/18 1708 182 lb (82.6 kg)     Height 07/30/18 1708 5\' 11"  (1.803 m)     Head Circumference --      Peak Flow --      Pain Score 07/30/18 1708 0     Pain Loc --      Pain Edu? --      Excl. in [place]? --    No data found.  Updated Vital Signs BP 133/66 (BP Location: Left Arm)   Pulse 68   Temp 97.8 F (36.6 C) (Oral)   Resp 18   Ht 5\' 11"  (1.803 m)   Wt 82.6 kg   SpO2 99%   BMI 25.38 kg/m   Visual Acuity Right Eye Distance:   Left Eye Distance:   Bilateral Distance:    Right Eye Near:   Left Eye Near:    Bilateral Near:     Physical Exam  Constitutional: He appears well-developed and well-nourished. No distress.  Skin: He is not diaphoretic.  Multiple superficial varicosities on right ankle and foot; no active bleeding currently  Nursing note and vitals reviewed.    UC Treatments / Results   Labs (all labs ordered are listed, but only abnormal results are displayed) Labs Reviewed - No data to display  EKG None  Radiology No results found.  Procedures Procedures (including critical care time)  Medications Ordered in UC Medications - No data to display  Initial Impression / Assessment and Plan / UC Course  I have reviewed the triage vital signs and the nursing notes.  Pertinent labs & imaging results that were available during my care of the patient were reviewed by me and considered in my medical decision making (see chart for details).      Final Clinical Impressions(s) / UC Diagnoses   Final diagnoses:  Bleeding from varicose vein  (resolved)   Discharge Instructions   None    ED Prescriptions    None     1. diagnosis reviewed with patient 2. Recommend wearing compression stockings 3. Follow-up prn  Controlled Substance Prescriptions [place] Controlled Substance Registry consulted? Not Applicable   Norval Gable, MD 07/30/18 [phone]

## 2018-10-05 ENCOUNTER — Telehealth: Payer: Self-pay | Admitting: General Surgery

## 2018-10-05 ENCOUNTER — Ambulatory Visit: Payer: Medicare Other | Admitting: Family Medicine

## 2018-10-05 ENCOUNTER — Encounter: Payer: Self-pay | Admitting: Family Medicine

## 2018-10-05 VITALS — BP 120/84 | HR 60 | Ht 71.0 in | Wt 182.0 lb

## 2018-10-05 DIAGNOSIS — M25552 Pain in left hip: Secondary | ICD-10-CM

## 2018-10-05 DIAGNOSIS — Z23 Encounter for immunization: Secondary | ICD-10-CM | POA: Diagnosis not present

## 2018-10-05 DIAGNOSIS — R1032 Left lower quadrant pain: Secondary | ICD-10-CM

## 2018-10-05 DIAGNOSIS — Z800 Family history of malignant neoplasm of digestive organs: Secondary | ICD-10-CM

## 2018-10-05 LAB — HEMOCCULT GUIAC POC 1CARD (OFFICE): Fecal Occult Blood, POC: NEGATIVE

## 2018-10-05 MED ORDER — AMOXICILLIN-POT CLAVULANATE 875-125 MG PO TABS: 1.0000 | ORAL_TABLET | Freq: Two times a day (BID) | ORAL | 0 refills | Status: DC

## 2018-10-05 MED ORDER — METRONIDAZOLE 500 MG PO TABS
500.0000 mg | ORAL_TABLET | Freq: Two times a day (BID) | ORAL | 0 refills | Status: DC
Start: 2018-10-05 — End: 2018-11-03

## 2018-10-05 MED ORDER — MELOXICAM 15 MG PO TABS: 15.0000 mg | ORAL_TABLET | Freq: Every day | ORAL | 0 refills | Status: DC

## 2018-10-05 NOTE — Telephone Encounter (Signed)
ERROR

## 2018-10-05 NOTE — Progress Notes (Signed)
Date:  10/05/2018   Name:  Nicholas Monroe   DOB:  1941-04-08   MRN:  [id number]   Chief Complaint: Hip Pain (feels pain around the LEFT side and radiating down into the groin and L) leg. Stinging sensation occasionally) and Flu Vaccine Hip Pain   The incident occurred more than 1 week ago. There was no injury mechanism (chiggers). The pain is present in the left leg and left hip. The quality of the pain is described as aching. The pain is at a severity of 0/10. The pain is mild. The pain has been fluctuating since onset. Pertinent negatives include no inability to bear weight, loss of motion, loss of sensation, muscle weakness, numbness or tingling. The symptoms are aggravated by movement. He has tried acetaminophen for the symptoms. The treatment provided moderate relief.  Abdominal Pain  This is a new problem. The current episode started 1 to 4 weeks ago (2 weeks). The problem occurs intermittently. The problem has been waxing and waning. The pain is located in the LLQ. The pain is mild. The quality of the pain is aching. Pain radiation: left inguinal. Pertinent negatives include no constipation, diarrhea, dysuria, fever, frequency, headaches, hematochezia, hematuria, melena, myalgias, nausea or weight loss. The pain is relieved by movement. He has tried nothing for the symptoms.     Review of Systems  Constitutional: Negative for chills, fever and weight loss.  HENT: Negative for drooling, ear discharge, ear pain and sore throat.   Respiratory: Negative for cough, shortness of breath and wheezing.   Cardiovascular: Negative for chest pain, palpitations and leg swelling.  Gastrointestinal: Negative for abdominal pain, blood in stool, constipation, diarrhea, hematochezia, melena and nausea.  Endocrine: Negative for polydipsia.  Genitourinary: Negative for dysuria, frequency, hematuria and urgency.  Musculoskeletal: Negative for back pain, myalgias and neck pain.  Skin: Negative for  rash.  Allergic/Immunologic: Negative for environmental allergies.  Neurological: Negative for dizziness, tingling, numbness and headaches.  Hematological: Does not bruise/bleed easily.  Psychiatric/Behavioral: Negative for suicidal ideas. The patient is not nervous/anxious.     Patient Active Problem List   Diagnosis Date Noted  . Nocturia more than twice per night 12/30/2017  . Hyperlipidemia 05/15/2016  . Essential hypertension 05/15/2016  . Closed right hip fracture ([place]) 07/05/2015    No Known Allergies  Past Surgical History:  Procedure Laterality Date  . APPENDECTOMY    . COLONOSCOPY  2011   normal- Dr Bary Castilla  . HERNIA REPAIR    . 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 Smoking status: Never Smoker  . Smokeless tobacco: Never Used  . Tobacco comment: smoking cessation materials not required  Substance Use Topics  . Alcohol use: No  . Drug use: No     Medication list has been reviewed and updated.  Current Meds  Medication Sig  . aspirin EC 81 MG tablet Take 1 tablet (81 mg total) by mouth daily.  Marland Kitchen atenolol (TENORMIN) 50 MG tablet Take 1 tablet (50 mg total) by mouth 2 (two) times daily.  . fenofibrate (TRICOR) 145 MG tablet Take 0.5 tablets (72.5 mg total) by mouth at bedtime.  . folic acid (FOLVITE) 1 MG tablet Take 1 mg by mouth daily. Pt does not take on Saturday.- Dr Manuella Ghazi  . lisinopril (PRINIVIL,ZESTRIL) 10 MG tablet Take  1 tablet (10 mg total) by mouth daily.  Marland Kitchen LOVAZA 1 g capsule 2 capsules bid  . methotrexate (RHEUMATREX) 2.5 MG tablet Take 10 mg by mouth once a week. Pt takes on Saturday. Dr Manuella Ghazi  . Multiple Vitamins-Minerals (CENTRUM SILVER PO) Take 1 tablet by mouth daily.    PHQ 2/9 Scores 12/30/2017 07/15/2017 07/15/2017 05/15/2016  PHQ - 2 Score 0 0 0 0  PHQ- 9 Score - 1 - -     Physical Exam  Constitutional: He is oriented to person, place, and time. He appears well-developed and well-nourished.  HENT:  Head: Normocephalic.  Right Ear: External ear normal.  Left Ear: External ear normal.  Nose: Nose normal.  Mouth/Throat: Oropharynx is clear and moist.  Eyes: Pupils are equal, round, and reactive to light. Conjunctivae and EOM are normal. Right eye exhibits no discharge. Left eye exhibits no discharge. No scleral icterus.  Neck: Normal range of motion. Neck supple. No JVD present. No tracheal deviation present. No thyromegaly present.  Cardiovascular: Normal rate, regular rhythm, normal heart sounds and intact distal pulses. Exam reveals no gallop and no friction rub.  No murmur heard. Pulmonary/Chest: Breath sounds normal. No respiratory distress. He has no wheezes. He has no rales.  Abdominal: Soft. Bowel sounds are normal. He exhibits no mass. There is no hepatosplenomegaly. There is tenderness in the left lower quadrant. There is no rebound, no guarding and no CVA tenderness.    Genitourinary: Prostate normal. Rectal exam shows no mass and guaiac negative stool.  Musculoskeletal: Normal range of motion. He exhibits no edema.       Left hip: He exhibits tenderness. He exhibits no bony tenderness, no swelling and no deformity.       Legs: Negative [place]  Lymphadenopathy:    He has no cervical adenopathy.  Neurological: He is alert and oriented to person, place, and time. He has normal strength and normal reflexes. No cranial nerve deficit.  Skin: Skin is warm. No rash noted.  Nursing note and vitals reviewed.   BP 120/84   Pulse 60   Ht 5\' 11"  (1.803 m)   Wt 182 lb (82.6 kg)   BMI 25.38 kg/m   Assessment and Plan:  1. Pain of left hip joint Tenderness of hip flexor c/w strain Meloxicam 15 mg daily - meloxicam (MOBIC) 15 MG tablet; Take 1 tablet (15 mg total) by mouth daily.  Dispense: 30 tablet; Refill: 0  2. Left lower quadrant abdominal  pain New onset Uncertain of colonoscopy findings. Will cover for diverticulitis with augmentin 875 mg bid and metronidazole.Check cbc/ - POCT occult blood stool - CBC with Differential/Platelet - amoxicillin-clavulanate (AUGMENTIN) 875-125 MG tablet; Take 1 tablet by mouth 2 (two) times daily.  Dispense: 20 tablet; Refill: 0 - metroNIDAZOLE (FLAGYL) 500 MG tablet; Take 1 tablet (500 mg total) by mouth 2 (two) times daily.  Dispense: 14 tablet; Refill: 0 - Ambulatory referral to General Surgery  3. Family history of colon cancer in mother Mother with colon cancer /surgery at age 79.  - POCT occult blood stool - Ambulatory referral to General Surgery  4. Flu vaccine need Discussed and administered. - Flu vaccine HIGH DOSE PF (Fluzone High dose).   Dr. [facility]  10/05/2018

## 2018-10-06 LAB — CBC WITH DIFFERENTIAL/PLATELET
Basophils Absolute: 0.1 10*3/uL (ref 0.0–0.2)
Basos: 1 %
EOS (ABSOLUTE): 0.3 10*3/uL (ref 0.0–0.4)
EOS: 5 %
HEMATOCRIT: 38.3 % (ref 37.5–51.0)
HEMOGLOBIN: 13.1 g/dL (ref 13.0–17.7)
Immature Grans (Abs): 0 10*3/uL (ref 0.0–0.1)
Immature Granulocytes: 1 %
LYMPHS ABS: 1 10*3/uL (ref 0.7–3.1)
Lymphs: 16 %
MCH: 31.9 pg (ref 26.6–33.0)
MCHC: 34.2 g/dL (ref 31.5–35.7)
MCV: 93 fL (ref 79–97)
MONOCYTES: 8 %
MONOS ABS: 0.5 10*3/uL (ref 0.1–0.9)
NEUTROS ABS: 4.3 10*3/uL (ref 1.4–7.0)
Neutrophils: 69 %
Platelets: 189 10*3/uL (ref 150–450)
RBC: 4.11 x10E6/uL — ABNORMAL LOW (ref 4.14–5.80)
RDW: 12.7 % (ref 12.3–15.4)
WBC: 6.3 10*3/uL (ref 3.4–10.8)

## 2018-10-28 ENCOUNTER — Ambulatory Visit: Payer: Medicare Other | Admitting: General Surgery

## 2018-10-28 ENCOUNTER — Encounter: Payer: Self-pay | Admitting: General Surgery

## 2018-10-28 ENCOUNTER — Other Ambulatory Visit: Payer: Self-pay

## 2018-10-28 VITALS — BP 134/85 | HR 63 | Temp 97.7°F | Resp 18 | Ht 71.0 in | Wt 186.4 lb

## 2018-10-28 DIAGNOSIS — Z1211 Encounter for screening for malignant neoplasm of colon: Secondary | ICD-10-CM | POA: Diagnosis not present

## 2018-10-28 MED ORDER — POLYETHYLENE GLYCOL 3350 17 GM/SCOOP PO POWD: ORAL | 0 refills | Status: DC

## 2018-10-28 NOTE — Progress Notes (Signed)
Patient ID: Nicholas Monroe, male   DOB: 1941/10/30, 77 y.o.   MRN: [id number]  Chief Complaint  Patient presents with  . Follow-up    10 year f/u to schedule colonoscopy-family hx colon cancer-last colonoscopy 01/2009 with Genoa Freyre    HPI Nicholas Monroe is a 77 y.o. male here today for 10 year f/u to schedule colonoscopy-family hx colon cancer-last colonoscopy 01/2009 with Eddie Payette. Patients wife Hoyle Sauer, states that Dr. Ronnald Ramp referred him to be checked due to some side pains that he has been having.  HPI  Past Medical History:  Diagnosis Date  . Hyperlipidemia   . Hypertension     Past Surgical History:  Procedure Laterality Date  . APPENDECTOMY    . COLONOSCOPY  01/2009   normal- Dr Bary Castilla  . HERNIA REPAIR    . 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 Healthy Father   . Diabetes Brother   . Hypertension Mother   . Colon cancer Mother 58  . Diabetes Daughter   . Diabetes Brother   . Stroke Brother     Social History Social History   Tobacco Use  . Smoking status: Never Smoker  . Smokeless tobacco: Never Used  . Tobacco comment: smoking cessation materials not required  Substance Use Topics  . Alcohol use: No  . Drug use: No    Not on File  Current Outpatient Medications  Medication Sig Dispense Refill  . amoxicillin-clavulanate (AUGMENTIN) 875-125 MG tablet Take 1 tablet by mouth 2 (two) times daily. 20 tablet 0  . aspirin EC 81 MG tablet Take 1 tablet (81 mg total) by mouth daily. 100 tablet 3  . atenolol (TENORMIN) 50 MG tablet Take 1 tablet (50 mg total) by mouth 2 (two) times daily. 180 tablet 1  . chlorpheniramine-HYDROcodone (TUSSIONEX) 10-8 MG/5ML SUER     . etodolac (LODINE) 500 MG tablet     . fenofibrate (TRICOR) 145 MG tablet Take 0.5 tablets (72.5 mg total) by mouth  at bedtime. 90 tablet 1  . folic acid (FOLVITE) 1 MG tablet Take 1 mg by mouth daily. Pt does not take on Saturday.- Dr Manuella Ghazi    . lisinopril (PRINIVIL,ZESTRIL) 10 MG tablet Take 1 tablet (10 mg total) by mouth daily. 90 tablet 1  . LOVAZA 1 g capsule 2 capsules bid 120 capsule 6  . meloxicam (MOBIC) 15 MG tablet Take 1 tablet (15 mg total) by mouth daily. 30 tablet 0  . methotrexate (RHEUMATREX) 2.5 MG tablet Take 10 mg by mouth once a week. Pt takes on Saturday. Dr Manuella Ghazi    . metroNIDAZOLE (FLAGYL) 500 MG tablet Take 1 tablet (500 mg total) by mouth 2 (two) times daily. 14 tablet 0  . Multiple Vitamins-Minerals (CENTRUM SILVER PO) Take 1 tablet by mouth daily.    . Multiple Vitamins-Minerals (MULTIVITAMIN ADULTS) TABS Take by mouth.    . valACYclovir (VALTREX) 1000 MG tablet     . polyethylene glycol powder (GLYCOLAX/MIRALAX) powder 255 grams one bottle for colonoscopy prep 255 g 0   No current facility-administered medications for this visit.     Review of Systems Review of Systems  Constitutional: Negative.   Respiratory: Negative.   Cardiovascular: Negative.     Blood pressure 134/85, pulse 63, temperature 97.7 F (36.5 C), temperature source Temporal, resp. rate  18, height 5\' 11"  (1.803 m), weight 186 lb 6.4 oz (84.6 kg), SpO2 96 %.  Physical Exam Physical Exam  Constitutional: He is oriented to person, place, and time. He appears well-developed and well-nourished.  Eyes: Conjunctivae are normal. No scleral icterus.  Neck: Normal range of motion.  Cardiovascular: Normal rate, regular rhythm and normal heart sounds.  Pulmonary/Chest: Effort normal and breath sounds normal.  Lymphadenopathy:    He has no cervical adenopathy.  Neurological: He is alert and oriented to person, place, and time.  Skin: Skin is warm and dry.    Data Reviewed Colonoscopy dated February 01, 2009 was reviewed: Normal exam.  October 05, 2018 CBC showed hemoglobin 13.1 with an MCV of 93, white blood  cell count of 6300 with normal differential, platelet count of 189,000.  Stool Hemoccult of the same date was negative.  Liver function studies of July 06, 2018: Normal.  Renal function panel in December 30, 2017: Normal.  Creatinine 0.92 with an estimated GFR of 81  Assessment    Candidate for screening colonoscopy..     Plan Patient has been scheduled for a colonoscopy on at [facility]. Miralax prescription has been sent in to the patient's pharmacy today. Colonoscopy instructions have been reviewed with the patient. This patient is aware to call the office if they have further questions.  HPI, Physical Exam, Assessment and Plan have been scribed under the direction and in the presence of Hervey Ard, Md.  Eudelia Bunch R. Bobette Mo, CMA    Forest Gleason Jashawn Floyd 10/28/2018, 10:00 PM  Patient has been scheduled for a colonoscopy on 11-03-18 at [facility]. Miralax prescription has been sent in to the patient's pharmacy today. This patient has been asked to discontinue fish oil today. It is okay for patient to continue an 81 mg aspirin once daily.  Colonoscopy instructions have been reviewed with the patient. This patient is aware to call the office if they have further questions.   Dominga Ferry, CMA

## 2018-10-28 NOTE — Patient Instructions (Addendum)
Patient needs to be scheduled for colonoscopy with Dr.Byrnett.  Please call the office with any questions or concerns.

## 2018-11-02 ENCOUNTER — Encounter: Payer: Self-pay | Admitting: *Deleted

## 2018-11-03 ENCOUNTER — Encounter: Payer: Self-pay | Admitting: *Deleted

## 2018-11-03 ENCOUNTER — Encounter
Admission: RE | Disposition: A | Discharge status: Home / Self Care | Payer: Self-pay | Source: Ambulatory Visit | Attending: General Surgery

## 2018-11-03 ENCOUNTER — Ambulatory Visit: Payer: Medicare Other | Admitting: Anesthesiology

## 2018-11-03 ENCOUNTER — Ambulatory Visit
Admission: RE | Admit: 2018-11-03 | Discharge: 2018-11-03 | Disposition: A | Discharge status: Home / Self Care | Payer: Medicare Other | Source: Ambulatory Visit | Attending: General Surgery | Admitting: General Surgery

## 2018-11-03 DIAGNOSIS — Z7982 Long term (current) use of aspirin: Secondary | ICD-10-CM | POA: Diagnosis not present

## 2018-11-03 DIAGNOSIS — E785 Hyperlipidemia, unspecified: Secondary | ICD-10-CM | POA: Insufficient documentation

## 2018-11-03 DIAGNOSIS — I10 Essential (primary) hypertension: Secondary | ICD-10-CM | POA: Insufficient documentation

## 2018-11-03 DIAGNOSIS — Z79899 Other long term (current) drug therapy: Secondary | ICD-10-CM | POA: Diagnosis not present

## 2018-11-03 DIAGNOSIS — Z791 Long term (current) use of non-steroidal anti-inflammatories (NSAID): Secondary | ICD-10-CM | POA: Diagnosis not present

## 2018-11-03 DIAGNOSIS — Z1211 Encounter for screening for malignant neoplasm of colon: Secondary | ICD-10-CM

## 2018-11-03 DIAGNOSIS — D123 Benign neoplasm of transverse colon: Secondary | ICD-10-CM | POA: Insufficient documentation

## 2018-11-03 HISTORY — PX: COLONOSCOPY WITH PROPOFOL: SHX5780

## 2018-11-03 SURGERY — COLONOSCOPY WITH PROPOFOL
Anesthesia: General

## 2018-11-03 MED ORDER — EPHEDRINE SULFATE 50 MG/ML IJ SOLN
INTRAMUSCULAR | Status: DC | PRN
  Administered 2018-11-03 (×2): 5 mg via INTRAVENOUS

## 2018-11-03 MED ORDER — SODIUM CHLORIDE 0.9 % IV SOLN
INTRAVENOUS | Status: DC
  Administered 2018-11-03: 11:00:00 via INTRAVENOUS

## 2018-11-03 MED ORDER — PROPOFOL 10 MG/ML IV BOLUS
INTRAVENOUS | Status: DC | PRN
  Administered 2018-11-03: 60 mg via INTRAVENOUS

## 2018-11-03 MED ORDER — PROPOFOL 500 MG/50ML IV EMUL
INTRAVENOUS | Status: DC | PRN
  Administered 2018-11-03: 120 ug/kg/min via INTRAVENOUS

## 2018-11-03 NOTE — H&P (Signed)
Nicholas Monroe [id number] 01-10-1941     HPI: Healthy 77 year old male for screening colonoscopy.  He tolerated the prep well.  Medications Prior to Admission  Medication Sig Dispense Refill Last Dose  . atenolol (TENORMIN) 50 MG tablet Take 1 tablet (50 mg total) by mouth 2 (two) times daily. 180 tablet 1 11/02/2018 at Unknown time  . fenofibrate (TRICOR) 145 MG tablet Take 0.5 tablets (72.5 mg total) by mouth at bedtime. 90 tablet 1 Past Week at Unknown time  . lisinopril (PRINIVIL,ZESTRIL) 10 MG tablet Take 1 tablet (10 mg total) by mouth daily. 90 tablet 1 11/02/2018 at Unknown time  . meloxicam (MOBIC) 15 MG tablet Take 1 tablet (15 mg total) by mouth daily. 30 tablet 0 Past Week at Unknown time  . methotrexate (RHEUMATREX) 2.5 MG tablet Take 10 mg by mouth once a week. Pt takes on Saturday. Dr Manuella Ghazi   Past Week at Unknown time  . Multiple Vitamins-Minerals (CENTRUM SILVER PO) Take 1 tablet by mouth daily.   11/02/2018 at Unknown time  . Multiple Vitamins-Minerals (MULTIVITAMIN ADULTS) TABS Take by mouth.   Past Week at Unknown time  . amoxicillin-clavulanate (AUGMENTIN) 875-125 MG tablet Take 1 tablet by mouth 2 (two) times daily. (Patient not taking: Reported on 11/03/2018) 20 tablet 0 Completed Course at Unknown time  . aspirin EC 81 MG tablet Take 1 tablet (81 mg total) by mouth daily. 100 tablet 3 11/02/2018  . chlorpheniramine-HYDROcodone ([place]) 10-8 MG/5ML SUER    Taking  . etodolac (LODINE) 500 MG tablet    Not Taking at Unknown time  . folic acid (FOLVITE) 1 MG tablet Take 1 mg by mouth daily. Pt does not take on Saturday.- Dr Manuella Ghazi   Taking  . LOVAZA 1 g capsule 2 capsules bid 120 capsule 6 Taking  . metroNIDAZOLE (FLAGYL) 500 MG tablet Take 1 tablet (500 mg total) by mouth 2 (two) times daily. (Patient not taking: Reported on 11/03/2018) 14 tablet 0 Completed Course at Unknown time  . polyethylene glycol powder (GLYCOLAX/MIRALAX) powder 255 grams one bottle for colonoscopy  prep 255 g 0   . valACYclovir (VALTREX) 1000 MG tablet    Taking   No Known Allergies Past Medical History:  Diagnosis Date  . Hyperlipidemia   . Hypertension    Past Surgical History:  Procedure Laterality Date  . APPENDECTOMY    . COLONOSCOPY  01/2009   normal- Dr Bary Castilla  . HERNIA REPAIR    . 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 Marital status: Married    Spouse name: Not on file  . Number of children: 2  . Years of education: Not on file  . Highest education level: Master's degree (e.g., MA, MS, MEng, MEd, MSW, MBA)  Occupational History  . Occupation: Retired  Scientific laboratory technician  . Financial resource strain: Not hard at all  . Food insecurity:    Worry: Never true    Inability: Never true  . Transportation needs:    Medical: No    Non-medical: No  Tobacco Use  . Smoking status: Never Smoker  . Smokeless tobacco: Never Used  . Tobacco comment: smoking cessation materials not required  Substance and Sexual Activity  . Alcohol use: No  . Drug use: No  . Sexual activity: Not Currently  Lifestyle  . Physical activity:  Days per week: 0 days    Minutes per session: 0 min  . Stress: Not at all  Relationships  . Social connections:    Talks on phone: Patient refused    Gets together: Patient refused    Attends religious service: Patient refused    Active member of club or organization: Patient refused    Attends meetings of clubs or organizations: Patient refused    Relationship status: Married  . Intimate partner violence:    Fear of current or ex partner: No    Emotionally abused: No    Physically abused: No    Forced sexual activity: No  Other Topics Concern  . Not on file  Social History Narrative  . Not on file   Social History   Social History Narrative  . Not on file      ROS: Negative.     PE: HEENT: Negative. Lungs: Clear. Cardio: RR.  Assessment/Plan:  Proceed with planned endoscopy.  Nicholas Monroe 11/03/2018

## 2018-11-03 NOTE — Anesthesia Post-op Follow-up Note (Signed)
Anesthesia QCDR form completed.        

## 2018-11-03 NOTE — Op Note (Addendum)
[facility] Patient Name: Nicholas Monroe Procedure Date: 11/03/2018 11:02 AM MRN: [id number] Account #: [id number] Date of Birth: 1941/11/14 Admit Type: Outpatient Age: 77 Room: [facility] ENDO ROOM 1 Gender: Male Note Status: Finalized Procedure:            Colonoscopy Indications:          Screening for colorectal malignant neoplasm Providers:            Robert Bellow, MD Referring MD:         Juline Patch, MD (Referring MD) Medicines:            Monitored Anesthesia Care Complications:        No immediate complications. Procedure:            Pre-Anesthesia Assessment:                       - Prior to the procedure, a History and Physical was                        performed, and patient medications, allergies and                        sensitivities were reviewed. The patient's tolerance of                        previous anesthesia was reviewed.                       - The risks and benefits of the procedure and the                        sedation options and risks were discussed with the                        patient. All questions were answered and informed                        consent was obtained.                       After obtaining informed consent, the colonoscope was                        passed under direct vision. Throughout the procedure,                        the patient's blood pressure, pulse, and oxygen                        saturations were monitored continuously. The                        Colonoscope was introduced through the anus and                        advanced to the the terminal ileum. The colonoscopy was                        somewhat difficult due to a tortuous colon. Successful  completion of the procedure was aided by using manual                        pressure. The patient tolerated the procedure well. The                        quality of the bowel preparation was  excellent. Findings:      A 7 mm polyp was found in the transverse colon. The polyp was sessile.       Biopsies were taken with a cold forceps for histology.      The retroflexed view of the distal rectum and anal verge was normal and       showed no anal or rectal abnormalities. Impression:           - One 7 mm polyp in the transverse colon. Biopsied.                       - The distal rectum and anal verge are normal on                        retroflexion view. Recommendation:       - Telephone endoscopist for pathology results in 1 week. Procedure Code(s):    --- Professional ---                       [phone], Colonoscopy, flexible; with biopsy, single or                        multiple Diagnosis Code(s):    --- Professional ---                       Z12.11, Encounter for screening for malignant neoplasm                        of colon                       D12.3, Benign neoplasm of transverse colon (hepatic                        flexure or splenic flexure) CPT copyright 2018 American Medical Association. All rights reserved. The codes documented in this report are preliminary and upon coder review may  be revised to meet current compliance requirements. Robert Bellow, MD 11/03/2018 11:52:07 AM This report has been signed electronically. Number of Addenda: 0 Note Initiated On: 11/03/2018 11:02 AM Scope Withdrawal Time: 0 hours 13 minutes 14 seconds  Total Procedure Duration: 0 hours 30 minutes 1 second       [facility]

## 2018-11-03 NOTE — Anesthesia Preprocedure Evaluation (Signed)
Anesthesia Evaluation  Patient identified by MRN, date of birth, ID band Patient awake    Reviewed: Allergy & Precautions, H&P , NPO status , reviewed documented beta blocker date and time   Airway Mallampati: II  TM Distance: >3 FB Neck ROM: full    Dental  (+) Chipped   Pulmonary    Pulmonary exam normal        Cardiovascular hypertension, Normal cardiovascular exam     Neuro/Psych    GI/Hepatic   Endo/Other    Renal/GU      Musculoskeletal   Abdominal   Peds  Hematology   Anesthesia Other Findings Past Medical History: No date: Hyperlipidemia No date: Hypertension  Past Surgical History: No date: APPENDECTOMY 01/2009: COLONOSCOPY     Comment:  normal- Dr Bary Castilla No date: HERNIA REPAIR 07/06/2015: TOTAL HIP ARTHROPLASTY; Right     Comment:  Procedure: TOTAL HIP ARTHROPLASTY;  Surgeon: Corky Mull, MD;  Location: [facility] ORS;  Service: Orthopedics;                Laterality: Right;  BMI    Body Mass Index:  25.66 kg/m      Reproductive/Obstetrics                             Anesthesia Physical Anesthesia Plan  ASA: II  Anesthesia Plan: General   Post-op Pain Management:    Induction: Intravenous  PONV Risk Score and Plan: 2 and Treatment may vary due to age or medical condition and TIVA  Airway Management Planned: Nasal Cannula and Natural Airway  Additional Equipment:   Intra-op Plan:   Post-operative Plan:   Informed Consent: I have reviewed the patients History and Physical, chart, labs and discussed the procedure including the risks, benefits and alternatives for the proposed anesthesia with the patient or authorized representative who has indicated his/her understanding and acceptance.   Dental Advisory Given  Plan Discussed with: CRNA  Anesthesia Plan Comments:         Anesthesia Quick Evaluation

## 2018-11-03 NOTE — Transfer of Care (Signed)
Immediate Anesthesia Transfer of Care Note  Patient: Nicholas Monroe  Procedure(s) Performed: COLONOSCOPY WITH PROPOFOL (N/A )  Patient Location: PACU  Anesthesia Type:General  Level of Consciousness: sedated  Airway & Oxygen Therapy: Patient Spontanous Breathing and Patient connected to nasal cannula oxygen  Post-op Assessment: Report given to RN and Post -op Vital signs reviewed and stable  Post vital signs: Reviewed and stable  Last Vitals:  Vitals Value Taken Time  BP 94/64 11/03/2018 11:58 AM  Temp 35.8 C 11/03/2018 11:58 AM  Pulse 65 11/03/2018 11:58 AM  Resp 20 11/03/2018 11:58 AM  SpO2 98 % 11/03/2018 11:58 AM    Last Pain:  Vitals:   11/03/18 1153  TempSrc: Tympanic         Complications: No apparent anesthesia complications

## 2018-11-04 LAB — SURGICAL PATHOLOGY

## 2018-11-05 ENCOUNTER — Encounter: Payer: Self-pay | Admitting: General Surgery

## 2018-11-08 ENCOUNTER — Telehealth: Payer: Self-pay

## 2018-11-08 NOTE — Telephone Encounter (Signed)
-----   Message from Robert Bellow, MD sent at 11/04/2018  8:35 PM EST ----- Please notify the patient the polyp was benign.  Normally, a five year follow up exam would be planned. Would place in recalls and see how is doing in five years and make a decision at that time.  ----- Message ----- From: Interface, Lab In Three Zero One Sent: 11/04/2018   4:17 PM EST To: Robert Bellow, MD

## 2018-11-08 NOTE — Telephone Encounter (Signed)
Notified patient as instructed, patient pleased. Discussed follow-up appointments, patient agrees  

## 2018-11-17 NOTE — Anesthesia Postprocedure Evaluation (Signed)
Anesthesia Post Note  Patient: Nicholas Monroe  Procedure(s) Performed: COLONOSCOPY WITH PROPOFOL (N/A )  Patient location during evaluation: Endoscopy Anesthesia Type: General Level of consciousness: awake and alert Pain management: pain level controlled Vital Signs Assessment: post-procedure vital signs reviewed and stable Respiratory status: spontaneous breathing, nonlabored ventilation and respiratory function stable Cardiovascular status: blood pressure returned to baseline and stable Postop Assessment: no apparent nausea or vomiting Anesthetic complications: no     Last Vitals:  Vitals:   11/03/18 1204 11/03/18 1231  BP: 100/89 124/78  Pulse:    Resp:    Temp:    SpO2:      Last Pain:  Vitals:   11/04/18 0802  TempSrc:   PainSc: 0-No pain                 Alphonsus Sias

## 2018-12-01 ENCOUNTER — Telehealth: Payer: Self-pay | Admitting: Family Medicine

## 2018-12-01 NOTE — Telephone Encounter (Signed)
Tried to call patient.  No answer. L/M to call Lattie Haw at [phone].  Need to schedule AWV-S. Last awv 12/30/17.lec

## 2018-12-02 NOTE — Telephone Encounter (Signed)
Spoke with patient and scheduled AWV-s for 01/03/19 at 1:20 pm.lec

## 2019-01-03 ENCOUNTER — Ambulatory Visit (INDEPENDENT_AMBULATORY_CARE_PROVIDER_SITE_OTHER): Payer: Medicare Other

## 2019-01-03 VITALS — BP 132/84 | HR 62 | Temp 97.4°F | Resp 16 | Ht 71.0 in | Wt 188.8 lb

## 2019-01-03 DIAGNOSIS — Z0000 Encounter for general adult medical examination without abnormal findings: Secondary | ICD-10-CM

## 2019-01-03 NOTE — Progress Notes (Signed)
Subjective:   Nicholas Monroe is a 78 y.o. male who presents for Medicare Annual/Subsequent preventive examination.  Review of Systems:   Cardiac Risk Factors include: advanced age (>10men, >29 women);dyslipidemia;hypertension;male gender     Objective:    Vitals: BP 132/84 (BP Location: Left Arm, Patient Position: Sitting, Cuff Size: Normal)   Pulse 62   Temp (!) 97.4 F (36.3 C) (Oral)   Resp 16   Ht 5\' 11"  (1.803 m)   Wt 188 lb 12.8 oz (85.6 kg)   SpO2 99%   BMI 26.33 kg/m   Body mass index is 26.33 kg/m.  Advanced Directives 01/03/2019 11/03/2018 07/30/2018 12/30/2017 12/29/2016 09/27/2015 07/05/2015  Does Patient Have a Medical Advance Directive? Yes Yes No Yes Yes Yes No  Type of Advance Directive Healthcare Power of [place];Living will -  Does patient want to make changes to medical advance directive? - - - Yes (MAU/Ambulatory/Procedural Areas - Information given) - - -  Copy of [place] in Chart? No - copy requested - - No - copy requested - No - copy requested -  Would patient like information on creating a medical advance directive? - - - - No - Patient declined - No - patient declined information    Tobacco Social History   Tobacco Use  Smoking Status Never Smoker  Smokeless Tobacco Never Used  Tobacco Comment   smoking cessation materials not required     Counseling given: Not Answered Comment: smoking cessation materials not required   Clinical Intake:  Pre-visit preparation completed: Yes  Pain : No/denies pain(intermittent discomfort in left hip/thigh)     Nutritional Status: BMI 25 -29 Overweight Nutritional Risks: None Diabetes: No  How often do you need to have someone help you when you read instructions, pamphlets, or other written materials from your doctor or pharmacy?: 1 - Never What is the last grade  level you completed in school?: master's degree in education from [employer]?: No  Information entered by :: Clemetine Marker LPN  Past Medical History:  Diagnosis Date  . Hyperlipidemia   . Hypertension   . 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 APPENDECTOMY    . COLONOSCOPY  01/2009   normal- Dr Bary Castilla  . COLONOSCOPY WITH PROPOFOL N/A 11/03/2018   Procedure: COLONOSCOPY WITH PROPOFOL;  Surgeon: Robert Bellow, MD;  Location: [facility] ENDOSCOPY;  Service: Endoscopy;  Laterality: N/A;  . HERNIA REPAIR    . 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 Healthy Father   . Diabetes Brother   . Hypertension Mother   . Colon cancer Mother 5  . Diabetes Daughter        type 1  . Diabetes Brother   . Stroke Brother    Social History   Socioeconomic History  . Marital status: Married    Spouse name: Not on file  . Number of children: 2  . Years of education: Not on file  . Highest education level: Master's degree (e.g., MA, MS, MEng, MEd, MSW, MBA)  Occupational History  . Occupation: Retired  Scientific laboratory technician  . Financial resource strain: Not  hard at all  . Food insecurity:    Worry: Never true    Inability: Never true  . Transportation needs:    Medical: No    Non-medical: No  Tobacco Use  . Smoking status: Never Smoker  . Smokeless tobacco: Never Used  . Tobacco comment: smoking cessation materials not required  Substance and Sexual Activity  . Alcohol use: No  . Drug use: No  . Sexual activity: Not Currently  Lifestyle  . Physical activity:    Days per week: 0 days    Minutes per session: 0 min  . Stress: Only a little  Relationships  . Social connections:    Talks on phone: More than three times a week     Gets together: More than three times a week    Attends religious service: More than 4 times per year    Active member of club or organization: No    Attends meetings of clubs or organizations: Never    Relationship status: Married  Other Topics Concern  . Not on file  Social History Narrative  . Not on file    Outpatient Encounter Medications as of 01/03/2019  Medication Sig  . aspirin EC 81 MG tablet Take 1 tablet (81 mg total) by mouth daily.  Marland Kitchen atenolol (TENORMIN) 50 MG tablet Take 1 tablet (50 mg total) by mouth 2 (two) times daily.  . fenofibrate (TRICOR) 145 MG tablet Take 0.5 tablets (72.5 mg total) by mouth at bedtime.  . folic acid (FOLVITE) 1 MG tablet Take 1 mg by mouth daily. Pt does not take on Saturday.- Dr Manuella Ghazi  . lisinopril (PRINIVIL,ZESTRIL) 10 MG tablet Take 1 tablet (10 mg total) by mouth daily.  Marland Kitchen LOVAZA 1 g capsule 2 capsules bid  . Melatonin 3 MG TABS Take 1 tablet by mouth at bedtime as needed.  . methotrexate (RHEUMATREX) 2.5 MG tablet Take 10 mg by mouth once a week. Pt takes on Saturday. Dr Manuella Ghazi  . Multiple Vitamins-Minerals (CENTRUM SILVER PO) Take 1 tablet by mouth daily.  Marland Kitchen etodolac (LODINE) 500 MG tablet   . meloxicam (MOBIC) 15 MG tablet Take 1 tablet (15 mg total) by mouth daily. (Patient not taking: Reported on 01/03/2019)  . [DISCONTINUED] amoxicillin-clavulanate (AUGMENTIN) 875-125 MG tablet Take 1 tablet by mouth 2 (two) times daily. (Patient not taking: Reported on 11/03/2018)  . [DISCONTINUED] chlorpheniramine-HYDROcodone ([place]) 10-8 MG/5ML SUER   . [DISCONTINUED] Multiple Vitamins-Minerals (MULTIVITAMIN ADULTS) TABS Take by mouth.  . [DISCONTINUED] valACYclovir (VALTREX) 1000 MG tablet    No facility-administered encounter medications on file as of 01/03/2019.     Activities of Daily Living In your present state of health, do you have any difficulty performing the following activities: 01/03/2019  Hearing? Y  Comment slight hearing difficulty  in right ear  Vision? N  Comment reading glasses  Difficulty concentrating or making decisions? N  Walking or climbing stairs? N  Dressing or bathing? N  Doing errands, shopping? N  Preparing Food and eating ? N  Using the Toilet? N  In the past six months, have you accidently leaked urine? N  Do you have problems with loss of bowel control? N  Managing your Medications? N  Managing your Finances? N  Housekeeping or managing your Housekeeping? N  Some recent data might be hidden    Patient Care Team: Juline Patch, MD as PCP - General (Family Medicine) Vladimir Crofts, MD as Consulting Physician (Neurology)   Assessment:   This  is a routine wellness examination for [place].  Exercise Activities and Dietary recommendations Current Exercise Habits: Home exercise routine(pt active at home), Exercise limited by: None identified  Goals    . Exercise 150 min/wk Moderate Activity     Recommend to exercise for at least 150 minutes per week.        Fall Risk Fall Risk  01/03/2019 10/28/2018 12/30/2017 07/15/2017 05/15/2016  Falls in the past year? 0 0 No No Yes  Number falls in past yr: 0 - - - 1  Injury with Fall? - - - - No  Risk for fall due to : - - History of fall(s) - -  Risk for fall due to: Comment - - painting and fell off ladder - -  Follow up Falls prevention discussed - - - -   FALL RISK PREVENTION PERTAINING TO THE HOME:  Any stairs in or around the home WITH handrails? Yes  Home free of loose throw rugs in walkways, pet beds, electrical cords, etc? Yes  Adequate lighting in your home to reduce risk of falls? Yes   ASSISTIVE DEVICES UTILIZED TO PREVENT FALLS:  Life alert? No  Use of a cane, walker or w/c? No  Grab bars in the bathroom? Yes  Shower chair or bench in shower? No  Elevated toilet seat or a handicapped toilet? No   DME ORDERS:  DME order needed?  No   TIMED UP AND GO:  Was the test performed? Yes .  Length of time to ambulate 10 feet: 6 sec.    GAIT:  Appearance of gait: Gait stead-fast and without the use of an assistive device.  Education: Fall risk prevention has been discussed.  Intervention(s) required? No   Depression Screen PHQ 2/9 Scores 01/03/2019 12/30/2017 07/15/2017 07/15/2017  PHQ - 2 Score 0 0 0 0  PHQ- 9 Score 1 - 1 -    Cognitive Function     6CIT Screen 01/03/2019 12/30/2017  What Year? 0 points 0 points  What month? 0 points 0 points  What time? 0 points 0 points  Count back from 20 0 points 0 points  Months in reverse 0 points 0 points  Repeat phrase 0 points 0 points  Total Score 0 0    Immunization History  Administered Date(s) Administered  . Influenza, High Dose Seasonal PF 10/23/2017, 10/05/2018  . Influenza,inj,Quad PF,6+ Mos 09/27/2015  . Pneumococcal Conjugate-13 01/14/2017  . Pneumococcal Polysaccharide-23 07/06/2018    Qualifies for Shingles Vaccine? Yes . Zostavax completed per patient at pharmacy.Due for Shingrix. Education has been provided regarding the importance of this vaccine. Pt has been advised to call insurance company to determine out of pocket expense. Advised may also receive vaccine at local pharmacy or Health Dept. Verbalized acceptance and understanding.  Tdap: done 2016 per patient  Flu Vaccine: Up to date  Pneumococcal Vaccine: Up to date   Screening Tests Health Maintenance  Topic Date Due  . TETANUS/TDAP  01/14/2027  . INFLUENZA VACCINE  Completed  . PNA vac Low Risk Adult  Completed   Cancer Screenings:  Colorectal Screening: Completed 11/03/18. Repeat every 5 years  Lung Cancer Screening: (Low Dose CT Chest recommended if Age 22-80 years, 30 pack-year currently smoking OR have quit w/in 15years.) does not qualify.   Additional Screening:  Hepatitis C Screening: no longer required  Vision Screening: Recommended annual ophthalmology exams for early detection of glaucoma and other disorders of the eye. Is the patient up to date with their annual  eye  exam?  No  Who is the provider or what is the name of the office in which the pt attends annual eye exams? [place] Screening: Recommended annual dental exams for proper oral hygiene  Community Resource Referral:  CRR required this visit?  No       Plan:    I have personally reviewed and addressed the Medicare Annual Wellness questionnaire and have noted the following in the patient's chart:  A. Medical and social history B. Use of alcohol, tobacco or illicit drugs  C. Current medications and supplements D. Functional ability and status E.  Nutritional status F.  Physical activity G. Advance directives H. List of other physicians I.  Hospitalizations, surgeries, and ER visits in previous 12 months J.  [place] such as hearing and vision if needed, cognitive and depression L. Referrals and appointments   In addition, I have reviewed and discussed with patient certain preventive protocols, quality metrics, and best practice recommendations. A written personalized care plan for preventive services as well as general preventive health recommendations were provided to patient.   Signed,  Clemetine Marker, LPN Nurse Health Advisor   Nurse Notes: Pt c/o discomfort in left hip/thigh area. He thought it was a pulled muscle and discomfort has been going on for the past 6 months. Plans to discuss at appt on 01/06/19.

## 2019-01-03 NOTE — Patient Instructions (Signed)
Nicholas Monroe , Thank you for taking time to come for your Medicare Wellness Visit. I appreciate your ongoing commitment to your health goals. Please review the following plan we discussed and let me know if I can assist you in the future.   Screening recommendations/referrals: Colonoscopy: done 11/03/2018 Recommended yearly ophthalmology/optometry visit for glaucoma screening and checkup Recommended yearly dental visit for hygiene and checkup  Vaccinations: Influenza vaccine: done 10/05/18 Pneumococcal vaccine: done 07/06/18 Tdap vaccine: done in 2016 Shingles vaccine: Shingrix discussed. Please contact your pharmacy for coverage information.     Advanced directives: Advance directive discussed with you today. Even though you declined this today please call our office should you change your mind and we can give you the proper paperwork for you to fill out.  Conditions/risks identified: Recommend increasing physical activity to 150 minutes per week.   Free hearing clinics offered in [place]:   [facility], [place], [place] [address] [phone]  [facility], [place], [place] [address] [phone]   Next appointment: 01/06/19 8:00 Dr. Ronnald Ramp  Preventive Care 55 Years and Older, Male Preventive care refers to lifestyle choices and visits with your health care provider that can promote health and wellness. What does preventive care include?  A yearly physical exam. This is also called an annual well check.  Dental exams once or twice a year.  Routine eye exams. Ask your health care provider how often you should have your eyes checked.  Personal lifestyle choices, including:  Daily care of your teeth and gums.  Regular physical activity.  Eating a healthy diet.  Avoiding tobacco and drug use.  Limiting alcohol use.  Practicing safe sex.  Taking low doses of aspirin every day.  Taking vitamin and mineral  supplements as recommended by your health care provider. What happens during an annual well check? The services and screenings done by your health care provider during your annual well check will depend on your age, overall health, lifestyle risk factors, and family history of disease. Counseling  Your health care provider may ask you questions about your:  Alcohol use.  Tobacco use.  Drug use.  Emotional well-being.  Home and relationship well-being.  Sexual activity.  Eating habits.  History of falls.  Memory and ability to understand (cognition).  Work and work Statistician. Screening  You may have the following tests or measurements:  Height, weight, and BMI.  Blood pressure.  Lipid and cholesterol levels. These may be checked every 5 years, or more frequently if you are over 36 years old.  Skin check.  Lung cancer screening. You may have this screening every year starting at age 90 if you have a 30-pack-year history of smoking and currently smoke or have quit within the past 15 years.  Fecal occult blood test (FOBT) of the stool. You may have this test every year starting at age 54.  Flexible sigmoidoscopy or colonoscopy. You may have a sigmoidoscopy every 5 years or a colonoscopy every 10 years starting at age 59.  Prostate cancer screening. Recommendations will vary depending on your family history and other risks.  Hepatitis C blood test.  Hepatitis B blood test.  Sexually transmitted disease (STD) testing.  Diabetes screening. This is done by checking your blood sugar (glucose) after you have not eaten for a while (fasting). You may have this done every 1-3 years.  Abdominal aortic aneurysm (AAA) screening. You may need this if you are  a current or former smoker.  Osteoporosis. You may be screened starting at age 8 if you are at high risk. Talk with your health care provider about your test results, treatment options, and if necessary, the need for more  tests. Vaccines  Your health care provider may recommend certain vaccines, such as:  Influenza vaccine. This is recommended every year.  Tetanus, diphtheria, and acellular pertussis (Tdap, Td) vaccine. You may need a Td booster every 10 years.  Zoster vaccine. You may need this after age 20.  Pneumococcal 13-valent conjugate (PCV13) vaccine. One dose is recommended after age 28.  Pneumococcal polysaccharide (PPSV23) vaccine. One dose is recommended after age 12. Talk to your health care provider about which screenings and vaccines you need and how often you need them. This information is not intended to replace advice given to you by your health care provider. Make sure you discuss any questions you have with your health care provider. Document Released: 12/28/2015 Document Revised: 08/20/2016 Document Reviewed: 10/02/2015 Elsevier Interactive Patient Education  2017 [place] Prevention in the Home Falls can cause injuries. They can happen to people of all ages. There are many things you can do to make your home safe and to help prevent falls. What can I do on the outside of my home?  Regularly fix the edges of walkways and driveways and fix any cracks.  Remove anything that might make you trip as you walk through a door, such as a raised step or threshold.  Trim any bushes or trees on the path to your home.  Use bright outdoor lighting.  Clear any walking paths of anything that might make someone trip, such as rocks or tools.  Regularly check to see if handrails are loose or broken. Make sure that both sides of any steps have handrails.  Any raised decks and porches should have guardrails on the edges.  Have any leaves, snow, or ice cleared regularly.  Use sand or salt on walking paths during winter.  Clean up any spills in your garage right away. This includes oil or grease spills. What can I do in the bathroom?  Use night lights.  Install grab bars by the  toilet and in the tub and shower. Do not use towel bars as grab bars.  Use non-skid mats or decals in the tub or shower.  If you need to sit down in the shower, use a plastic, non-slip stool.  Keep the floor dry. Clean up any water that spills on the floor as soon as it happens.  Remove soap buildup in the tub or shower regularly.  Attach bath mats securely with double-sided non-slip rug tape.  Do not have throw rugs and other things on the floor that can make you trip. What can I do in the bedroom?  Use night lights.  Make sure that you have a light by your bed that is easy to reach.  Do not use any sheets or blankets that are too big for your bed. They should not hang down onto the floor.  Have a firm chair that has side arms. You can use this for support while you get dressed.  Do not have throw rugs and other things on the floor that can make you trip. What can I do in the kitchen?  Clean up any spills right away.  Avoid walking on wet floors.  Keep items that you use a lot in easy-to-reach places.  If you need to reach something  above you, use a strong step stool that has a grab bar.  Keep electrical cords out of the way.  Do not use floor polish or wax that makes floors slippery. If you must use wax, use non-skid floor wax.  Do not have throw rugs and other things on the floor that can make you trip. What can I do with my stairs?  Do not leave any items on the stairs.  Make sure that there are handrails on both sides of the stairs and use them. Fix handrails that are broken or loose. Make sure that handrails are as long as the stairways.  Check any carpeting to make sure that it is firmly attached to the stairs. Fix any carpet that is loose or worn.  Avoid having throw rugs at the top or bottom of the stairs. If you do have throw rugs, attach them to the floor with carpet tape.  Make sure that you have a light switch at the top of the stairs and the bottom of  the stairs. If you do not have them, ask someone to add them for you. What else can I do to help prevent falls?  Wear shoes that:  Do not have high heels.  Have rubber bottoms.  Are comfortable and fit you well.  Are closed at the toe. Do not wear sandals.  If you use a stepladder:  Make sure that it is fully opened. Do not climb a closed stepladder.  Make sure that both sides of the stepladder are locked into place.  Ask someone to hold it for you, if possible.  Clearly mark and make sure that you can see:  Any grab bars or handrails.  First and last steps.  Where the edge of each step is.  Use tools that help you move around (mobility aids) if they are needed. These include:  Canes.  Walkers.  Scooters.   Crutches.  Turn on the lights when you go into a dark area. Replace any light bulbs as soon as they burn out.  Set up your furniture so you have a clear path. Avoid moving your furniture around.  If any of your floors are uneven, fix them.  If there are any pets around you, be aware of where they are.  Review your medicines with your doctor. Some medicines can make you feel dizzy. This can increase your chance of falling. Ask your doctor what other things that you can do to help prevent falls. This information is not intended to replace advice given to you by your health care provider. Make sure you discuss any questions you have with your health care provider. Document Released: 09/27/2009 Document Revised: 05/08/2016 Document Reviewed: 01/05/2015 Elsevier Interactive Patient Education  2017 [employer].

## 2019-01-06 ENCOUNTER — Ambulatory Visit: Payer: Medicare Other | Admitting: Family Medicine

## 2019-01-06 ENCOUNTER — Encounter: Payer: Self-pay | Admitting: Family Medicine

## 2019-01-06 VITALS — BP 104/70 | HR 64 | Ht 71.0 in | Wt 184.0 lb

## 2019-01-06 DIAGNOSIS — M25552 Pain in left hip: Secondary | ICD-10-CM

## 2019-01-06 DIAGNOSIS — E782 Mixed hyperlipidemia: Secondary | ICD-10-CM

## 2019-01-06 DIAGNOSIS — I10 Essential (primary) hypertension: Secondary | ICD-10-CM | POA: Diagnosis not present

## 2019-01-06 DIAGNOSIS — R350 Frequency of micturition: Secondary | ICD-10-CM

## 2019-01-06 DIAGNOSIS — N401 Enlarged prostate with lower urinary tract symptoms: Secondary | ICD-10-CM | POA: Diagnosis not present

## 2019-01-06 MED ORDER — FENOFIBRATE 145 MG PO TABS: 72.5000 mg | ORAL_TABLET | Freq: Every day | ORAL | 1 refills | Status: DC

## 2019-01-06 MED ORDER — ATENOLOL 50 MG PO TABS: 50.0000 mg | ORAL_TABLET | Freq: Two times a day (BID) | ORAL | 1 refills | Status: DC

## 2019-01-06 MED ORDER — LOVAZA 1 G PO CAPS: ORAL_CAPSULE | ORAL | 6 refills | Status: DC

## 2019-01-06 MED ORDER — LISINOPRIL 10 MG PO TABS: 10.0000 mg | ORAL_TABLET | Freq: Every day | ORAL | 1 refills | Status: DC

## 2019-01-06 MED ORDER — MELOXICAM 15 MG PO TABS: 15.0000 mg | ORAL_TABLET | Freq: Every day | ORAL | 5 refills | Status: DC

## 2019-01-06 NOTE — Progress Notes (Signed)
Date:  01/06/2019   Name:  Nicholas Monroe   DOB:  10-Sep-1941   MRN:  [id number]   Chief Complaint: Hypertension; Hyperlipidemia; and Hip Pain (refill meloxicam)  Hypertension  This is a chronic problem. The current episode started more than 1 year ago. The problem is unchanged. The problem is controlled. Pertinent negatives include no anxiety, blurred vision, chest pain, headaches, malaise/fatigue, neck pain, orthopnea, palpitations, peripheral edema, PND, shortness of breath or sweats. There are no associated agents to hypertension. There are no known risk factors for coronary artery disease. Past treatments include beta blockers and ACE inhibitors. The current treatment provides mild improvement. There are no compliance problems.  There is no history of angina, kidney disease, CVA, heart failure, left ventricular hypertrophy, PVD or retinopathy. There is no history of chronic renal disease, a hypertension causing med or renovascular disease.  Hyperlipidemia  This is a chronic problem. The current episode started more than 1 year ago. The problem is controlled. Recent lipid tests were reviewed and are normal. He has no history of chronic renal disease, diabetes, hypothyroidism, liver disease, obesity or nephrotic syndrome. There are no known factors aggravating his hyperlipidemia. Pertinent negatives include no chest pain, focal sensory loss, focal weakness, leg pain, myalgias or shortness of breath. Current antihyperlipidemic treatment includes fibric acid derivatives (and lovasa). The current treatment provides moderate improvement of lipids. There are no compliance problems.  Risk factors for coronary artery disease include dyslipidemia, hypertension and male sex.  Hip Pain   The incident occurred more than 1 week ago. There was no injury mechanism. The pain is present in the left hip and left thigh. The quality of the pain is described as aching. The pain is moderate. The pain has been  fluctuating since onset. Associated symptoms include tingling. Pertinent negatives include no inability to bear weight, loss of motion, loss of sensation, muscle weakness or numbness. He has tried acetaminophen and NSAIDs for the symptoms. The treatment provided moderate relief.    Review of Systems  Constitutional: Negative for chills, fever and malaise/fatigue.  HENT: Negative for drooling, ear discharge, ear pain and sore throat.   Eyes: Negative for blurred vision.  Respiratory: Negative for cough, shortness of breath and wheezing.   Cardiovascular: Negative for chest pain, palpitations, orthopnea, leg swelling and PND.  Gastrointestinal: Negative for abdominal pain, blood in stool, constipation, diarrhea and nausea.  Endocrine: Negative for polydipsia.  Genitourinary: Negative for difficulty urinating, dysuria, frequency, hematuria and urgency.       Nocturia  Musculoskeletal: Negative for back pain, myalgias and neck pain.  Skin: Negative for rash.  Allergic/Immunologic: Negative for environmental allergies.  Neurological: Positive for tingling. Negative for dizziness, focal weakness, numbness and headaches.  Hematological: Does not bruise/bleed easily.  Psychiatric/Behavioral: Negative for suicidal ideas. The patient is not nervous/anxious.     Patient Active Problem List   Diagnosis Date Noted  . Encounter for screening colonoscopy 10/28/2018  . Nocturia more than twice per night 12/30/2017  . Hyperlipidemia 05/15/2016  . Essential hypertension 05/15/2016  . Closed right hip fracture ([place]) 07/05/2015    No Known Allergies  Past Surgical History:  Procedure Laterality Date  . APPENDECTOMY    . COLONOSCOPY  01/2009   normal- Dr Bary Castilla  . COLONOSCOPY WITH PROPOFOL N/A 11/03/2018   Procedure: COLONOSCOPY WITH PROPOFOL;  Surgeon: Robert Bellow, MD;  Location: [facility] ENDOSCOPY;  Service: Endoscopy;  Laterality: N/A;  . HERNIA REPAIR    . TOTAL HIP ARTHROPLASTY  Right  07/06/2015   Procedure: TOTAL HIP ARTHROPLASTY;  Surgeon: Corky Mull, MD;  Location: [facility] ORS;  Service: Orthopedics;  Laterality: Right;    Social History   Tobacco Use  . Smoking status: Never Smoker  . Smokeless tobacco: Never Used  . Tobacco comment: smoking cessation materials not required  Substance Use Topics  . Alcohol use: No  . Drug use: No     Medication list has been reviewed and updated.  Current Meds  Medication Sig  . aspirin EC 81 MG tablet Take 1 tablet (81 mg total) by mouth daily.  Marland Kitchen atenolol (TENORMIN) 50 MG tablet Take 1 tablet (50 mg total) by mouth 2 (two) times daily.  . fenofibrate (TRICOR) 145 MG tablet Take 0.5 tablets (72.5 mg total) by mouth at bedtime.  . folic acid (FOLVITE) 1 MG tablet Take 1 mg by mouth daily. Pt does not take on Saturday.- Dr Manuella Ghazi  . lisinopril (PRINIVIL,ZESTRIL) 10 MG tablet Take 1 tablet (10 mg total) by mouth daily.  Marland Kitchen LOVAZA 1 g capsule 2 capsules bid  . Melatonin 3 MG TABS Take 1 tablet by mouth at bedtime as needed.  . methotrexate (RHEUMATREX) 2.5 MG tablet Take 15 mg by mouth once a week. Pt takes on Saturday. Dr Manuella Ghazi  . Multiple Vitamins-Minerals (CENTRUM SILVER PO) Take 1 tablet by mouth daily.    PHQ 2/9 Scores 01/03/2019 12/30/2017 07/15/2017 07/15/2017  PHQ - 2 Score 0 0 0 0  PHQ- 9 Score 1 - 1 -    Physical Exam Vitals signs and nursing note reviewed.  HENT:     Head: Normocephalic.     Right Ear: External ear normal.     Left Ear: External ear normal.     Nose: Nose normal.  Eyes:     General: No scleral icterus.       Right eye: No discharge.        Left eye: No discharge.     Conjunctiva/sclera: Conjunctivae normal.     Pupils: Pupils are equal, round, and reactive to light.  Neck:     Musculoskeletal: Normal range of motion and neck supple.     Thyroid: No thyromegaly.     Vascular: No carotid bruit or JVD.     Trachea: No tracheal deviation.  Cardiovascular:     Rate and Rhythm: Normal rate and  regular rhythm.     Heart sounds: Normal heart sounds. No murmur. No friction rub. No gallop.   Pulmonary:     Effort: No respiratory distress.     Breath sounds: Normal breath sounds. No wheezing or rales.  Abdominal:     General: Bowel sounds are normal.     Palpations: Abdomen is soft. There is no mass.     Tenderness: There is no abdominal tenderness. There is no guarding or rebound.  Genitourinary:    Prostate: Enlarged. Not tender and no nodules present.     Rectum: Normal.  Musculoskeletal: Normal range of motion.     Left hip: He exhibits tenderness. He exhibits normal range of motion.     Comments: Discomfort mild with internal/external rotation  Lymphadenopathy:     Cervical: No cervical adenopathy.  Skin:    General: Skin is warm.     Findings: No rash.  Neurological:     Mental Status: He is alert and oriented to person, place, and time.     Cranial Nerves: No cranial nerve deficit.     Deep Tendon Reflexes:  Reflexes are normal and symmetric.     BP 104/70   Pulse 64   Ht 5\' 11"  (1.803 m)   Wt 184 lb (83.5 kg)   BMI 25.66 kg/m   Assessment and Plan: 1. Essential hypertension Chronic.  Controlled.  Continue atenolol 50 mg twice a day and lisinopril 10 mg once a day.  Will check renal function panel. - Renal Function Panel - atenolol (TENORMIN) 50 MG tablet; Take 1 tablet (50 mg total) by mouth 2 (two) times daily.  Dispense: 180 tablet; Refill: 1 - lisinopril (PRINIVIL,ZESTRIL) 10 MG tablet; Take 1 tablet (10 mg total) by mouth daily.  Dispense: 90 tablet; Refill: 1  2. Mixed hyperlipidemia Chronic.  Controlled.  Continue fenofibrate half tablet at bedtime and Lovaza 2 tablets twice daily.  Check blood panel. - Lipid panel - fenofibrate (TRICOR) 145 MG tablet; Take 0.5 tablets (72.5 mg total) by mouth at bedtime.  Dispense: 90 tablet; Refill: 1 - LOVAZA 1 g capsule; 2 capsules bid  Dispense: 120 capsule; Refill: 6  3. Pain of left hip joint Patient has  recurrence of pain in the left hip area extending into the quadricep.  Will refill meloxicam 15 mg understanding if it returns or continues we will have orthopedics to evaluate.  Patient does not want an x-ray at this time - meloxicam (MOBIC) 15 MG tablet; Take 1 tablet (15 mg total) by mouth daily.  Dispense: 30 tablet; Refill: 5  4. Benign prostatic hyperplasia with urinary frequency Patient has seen urology in the past..  He has noted that he has had nocturia sometimes 3-4 times a night but denies any change in his stream or difficulty with urination.  Will check PSA today.  Prostate is enlarged but is normal consistency without nodule. - PSA

## 2019-01-07 LAB — RENAL FUNCTION PANEL
Albumin: 4.6 g/dL (ref 3.7–4.7)
BUN / CREAT RATIO: 14 (ref 10–24)
BUN: 15 mg/dL (ref 8–27)
CALCIUM: 9.8 mg/dL (ref 8.6–10.2)
CHLORIDE: 102 mmol/L (ref 96–106)
CO2: 23 mmol/L (ref 20–29)
Creatinine, Ser: 1.11 mg/dL (ref 0.76–1.27)
GFR calc non Af Amer: 64 mL/min/{1.73_m2} (ref >59)
GFR, EST AFRICAN AMERICAN: 74 mL/min/{1.73_m2} (ref >59)
Glucose: 109 mg/dL — ABNORMAL HIGH (ref 65–99)
Phosphorus: 3.2 mg/dL (ref 2.8–4.1)
Potassium: 4.4 mmol/L (ref 3.5–5.2)
SODIUM: 139 mmol/L (ref 134–144)

## 2019-01-07 LAB — LIPID PANEL
CHOLESTEROL TOTAL: 150 mg/dL (ref 100–199)
Chol/HDL Ratio: 4.3 ratio (ref 0.0–5.0)
HDL: 35 mg/dL — ABNORMAL LOW (ref >39)
LDL CALC: 94 mg/dL (ref 0–99)
Triglycerides: 107 mg/dL (ref 0–149)
VLDL Cholesterol Cal: 21 mg/dL (ref 5–40)

## 2019-01-07 LAB — PSA: PROSTATE SPECIFIC AG, SERUM: 1.8 ng/mL (ref 0.0–4.0)

## 2019-03-04 DIAGNOSIS — M1612 Unilateral primary osteoarthritis, left hip: Secondary | ICD-10-CM | POA: Insufficient documentation

## 2019-03-04 DIAGNOSIS — M47816 Spondylosis without myelopathy or radiculopathy, lumbar region: Secondary | ICD-10-CM | POA: Insufficient documentation

## 2019-06-24 ENCOUNTER — Other Ambulatory Visit: Payer: Self-pay | Admitting: Family Medicine

## 2019-06-24 DIAGNOSIS — M25552 Pain in left hip: Secondary | ICD-10-CM

## 2019-07-07 ENCOUNTER — Encounter: Payer: Self-pay | Admitting: Family Medicine

## 2019-07-07 ENCOUNTER — Other Ambulatory Visit: Payer: Self-pay

## 2019-07-07 ENCOUNTER — Ambulatory Visit (INDEPENDENT_AMBULATORY_CARE_PROVIDER_SITE_OTHER): Payer: Medicare Other | Admitting: Family Medicine

## 2019-07-07 VITALS — BP 134/80 | HR 64 | Ht 71.0 in | Wt 183.0 lb

## 2019-07-07 DIAGNOSIS — E782 Mixed hyperlipidemia: Secondary | ICD-10-CM

## 2019-07-07 DIAGNOSIS — R69 Illness, unspecified: Secondary | ICD-10-CM

## 2019-07-07 DIAGNOSIS — I10 Essential (primary) hypertension: Secondary | ICD-10-CM | POA: Diagnosis not present

## 2019-07-07 MED ORDER — LISINOPRIL 10 MG PO TABS: 10.0000 mg | ORAL_TABLET | Freq: Every day | ORAL | 1 refills | Status: DC

## 2019-07-07 MED ORDER — FENOFIBRATE 145 MG PO TABS: 72.5000 mg | ORAL_TABLET | Freq: Every day | ORAL | 1 refills | Status: DC

## 2019-07-07 MED ORDER — LOVAZA 1 G PO CAPS: ORAL_CAPSULE | ORAL | 6 refills | Status: DC

## 2019-07-07 MED ORDER — ATENOLOL 50 MG PO TABS: 50.0000 mg | ORAL_TABLET | Freq: Two times a day (BID) | ORAL | 1 refills | Status: DC

## 2019-07-07 NOTE — Patient Instructions (Signed)

## 2019-07-07 NOTE — Progress Notes (Signed)
Date:  07/07/2019   Name:  Nicholas Monroe   DOB:  Aug 01, 1941   MRN:  [id number]   Chief Complaint: Hypertension and Hyperlipidemia  Hypertension This is a chronic problem. The current episode started more than 1 year ago. The problem has been gradually improving since onset. The problem is controlled. Pertinent negatives include no anxiety, blurred vision, chest pain, headaches, malaise/fatigue, neck pain, orthopnea, palpitations, peripheral edema, PND, shortness of breath or sweats. There are no associated agents to hypertension. Risk factors for coronary artery disease include dyslipidemia and male gender. Past treatments include ACE inhibitors and calcium channel blockers. The current treatment provides moderate improvement. There are no compliance problems.  There is no history of angina, kidney disease, CAD/MI, CVA, heart failure, left ventricular hypertrophy, PVD or retinopathy. There is no history of chronic renal disease, a hypertension causing med or renovascular disease.  Hyperlipidemia This is a chronic problem. The current episode started more than 1 year ago. The problem is controlled. Recent lipid tests were reviewed and are normal. He has no history of chronic renal disease, diabetes, hypothyroidism, liver disease, obesity or nephrotic syndrome. There are no known factors aggravating his hyperlipidemia. Pertinent negatives include no chest pain, focal sensory loss, focal weakness, leg pain, myalgias or shortness of breath. Current antihyperlipidemic treatment includes fibric acid derivatives (and Lovasa). The current treatment provides moderate improvement of lipids. There are no compliance problems.  Risk factors for coronary artery disease include dyslipidemia, hypertension, male sex and post-menopausal.    Review of Systems  Constitutional: Negative for chills, fever and malaise/fatigue.  HENT: Negative for drooling, ear discharge, ear pain and sore throat.   Eyes: Negative  for blurred vision.  Respiratory: Negative for cough, shortness of breath and wheezing.   Cardiovascular: Negative for chest pain, palpitations, orthopnea, leg swelling and PND.  Gastrointestinal: Negative for abdominal pain, blood in stool, constipation, diarrhea and nausea.  Endocrine: Negative for polydipsia.  Genitourinary: Negative for dysuria, frequency, hematuria and urgency.  Musculoskeletal: Negative for back pain, myalgias and neck pain.  Skin: Negative for rash.  Allergic/Immunologic: Negative for environmental allergies.  Neurological: Negative for dizziness, focal weakness and headaches.  Hematological: Does not bruise/bleed easily.  Psychiatric/Behavioral: Negative for suicidal ideas. The patient is not nervous/anxious.     Patient Active Problem List   Diagnosis Date Noted  . Encounter for screening colonoscopy 10/28/2018  . Nocturia more than twice per night 12/30/2017  . Hyperlipidemia 05/15/2016  . Essential hypertension 05/15/2016  . Closed right hip fracture ([place]) 07/05/2015    No Known Allergies  Past Surgical History:  Procedure Laterality Date  . APPENDECTOMY    . COLONOSCOPY  01/2009   normal- Dr Bary Castilla  . COLONOSCOPY WITH PROPOFOL N/A 11/03/2018   Procedure: COLONOSCOPY WITH PROPOFOL;  Surgeon: Robert Bellow, MD;  Location: [facility] ENDOSCOPY;  Service: Endoscopy;  Laterality: N/A;  . HERNIA REPAIR    . 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 Smoking status: Never Smoker  . Smokeless tobacco: Never Used  . Tobacco comment: smoking cessation materials not required  Substance Use Topics  . Alcohol use: No  . Drug use: No     Medication list has been reviewed and updated.  Current Meds  Medication Sig  . acetaminophen (TYLENOL) 500 MG tablet Take 500 mg by  mouth every 8 (eight)  hours as needed for mild pain. Hip pain  . aspirin EC 81 MG tablet Take 1 tablet (81 mg total) by mouth daily.  Marland Kitchen atenolol (TENORMIN) 50 MG tablet Take 1 tablet (50 mg total) by mouth 2 (two) times daily.  . fenofibrate (TRICOR) 145 MG tablet Take 0.5 tablets (72.5 mg total) by mouth at bedtime.  . folic acid (FOLVITE) 1 MG tablet Take 1 mg by mouth daily. Pt does not take on Saturday.- Dr Manuella Ghazi  . lisinopril (PRINIVIL,ZESTRIL) 10 MG tablet Take 1 tablet (10 mg total) by mouth daily.  Marland Kitchen LOVAZA 1 g capsule 2 capsules bid  . Melatonin 3 MG TABS Take 1 tablet by mouth at bedtime as needed.  . meloxicam (MOBIC) 15 MG tablet TAKE ONE (1) TABLET BY MOUTH ONCE DAILY  . methotrexate (RHEUMATREX) 2.5 MG tablet Take 15 mg by mouth once a week. Pt takes on Saturday. Dr Manuella Ghazi  . Multiple Vitamins-Minerals (CENTRUM SILVER PO) Take 1 tablet by mouth daily.    PHQ 2/9 Scores 07/07/2019 01/03/2019 12/30/2017 07/15/2017  PHQ - 2 Score 0 0 0 0  PHQ- 9 Score 0 1 - 1    BP Readings from Last 3 Encounters:  07/07/19 134/80  01/06/19 104/70  01/03/19 132/84    Physical Exam Vitals signs and nursing note reviewed.  HENT:     Head: Normocephalic.     Right Ear: Tympanic membrane, ear canal and external ear normal.     Left Ear: Tympanic membrane, ear canal and external ear normal.     Nose: Nose normal.  Eyes:     General: No scleral icterus.       Right eye: No discharge.        Left eye: No discharge.     Conjunctiva/sclera: Conjunctivae normal.     Pupils: Pupils are equal, round, and reactive to light.  Neck:     Musculoskeletal: Normal range of motion and neck supple.     Thyroid: No thyromegaly.     Vascular: No JVD.     Trachea: No tracheal deviation.  Cardiovascular:     Rate and Rhythm: Normal rate and regular rhythm.     Heart sounds: Normal heart sounds, S1 normal and S2 normal. No murmur. No systolic murmur. No diastolic murmur. No friction rub. No gallop. No S3 or S4 sounds.   Pulmonary:      Effort: No respiratory distress.     Breath sounds: Normal breath sounds. No decreased breath sounds, wheezing, rhonchi or rales.  Abdominal:     General: Bowel sounds are normal.     Palpations: Abdomen is soft. There is no hepatomegaly, splenomegaly or mass.     Tenderness: There is no abdominal tenderness. There is no guarding or rebound.  Musculoskeletal: Normal range of motion.        General: No tenderness.     Right lower leg: No edema.     Left lower leg: No edema.  Lymphadenopathy:     Cervical: No cervical adenopathy.  Skin:    General: Skin is warm.     Findings: No rash.  Neurological:     Mental Status: He is alert and oriented to person, place, and time.     Cranial Nerves: No cranial nerve deficit.     Deep Tendon Reflexes: Reflexes are normal and symmetric.     Wt Readings from Last 3 Encounters:  07/07/19 183 lb (83 kg)  01/06/19 184 lb (83.5  kg)  01/03/19 188 lb 12.8 oz (85.6 kg)    BP 134/80   Pulse 64   Ht 5\' 11"  (1.803 m)   Wt 183 lb (83 kg)   BMI 25.52 kg/m   Assessment and Plan:  1. Essential hypertension Chronic.  Controlled.  Continue atenolol 50 mg 1 tablet twice a day.  And lisinopril 10 mg 1 tablet daily.  Will check renal function panel. - atenolol (TENORMIN) 50 MG tablet; Take 1 tablet (50 mg total) by mouth 2 (two) times daily.  Dispense: 180 tablet; Refill: 1 - lisinopril (ZESTRIL) 10 MG tablet; Take 1 tablet (10 mg total) by mouth daily.  Dispense: 90 tablet; Refill: 1 - Renal Function Panel  2. Mixed hyperlipidemia Chronic.  Controlled.  Will continue fenofibrate 145 mg once a day and Lovaza 2 capsules twice a day.  Will check lipid panel for evaluation. - fenofibrate (TRICOR) 145 MG tablet; Take 0.5 tablets (72.5 mg total) by mouth at bedtime.  Dispense: 90 tablet; Refill: 1 - LOVAZA 1 g capsule; 2 capsules bid  Dispense: 120 capsule; Refill: 6 - Lipid Panel With LDL/HDL Ratio  3. Taking medication for chronic disease We will  evaluate weight hepatic function for possible drug interaction. - Hepatic function panel

## 2019-07-08 LAB — RENAL FUNCTION PANEL
Albumin: 4.5 g/dL (ref 3.7–4.7)
BUN/Creatinine Ratio: 21 (ref 10–24)
BUN: 32 mg/dL — ABNORMAL HIGH (ref 8–27)
CO2: 20 mmol/L (ref 20–29)
Calcium: 9.4 mg/dL (ref 8.6–10.2)
Chloride: 104 mmol/L (ref 96–106)
Creatinine, Ser: 1.54 mg/dL — ABNORMAL HIGH (ref 0.76–1.27)
GFR calc Af Amer: 50 mL/min/{1.73_m2} — ABNORMAL LOW (ref >59)
GFR calc non Af Amer: 43 mL/min/{1.73_m2} — ABNORMAL LOW (ref >59)
Glucose: 109 mg/dL — ABNORMAL HIGH (ref 65–99)
Phosphorus: 3.5 mg/dL (ref 2.8–4.1)
Potassium: 4.8 mmol/L (ref 3.5–5.2)
Sodium: 139 mmol/L (ref 134–144)

## 2019-07-08 LAB — HEPATIC FUNCTION PANEL
ALT: 11 IU/L (ref 0–44)
AST: 18 IU/L (ref 0–40)
Alkaline Phosphatase: 40 IU/L (ref 39–117)
Bilirubin Total: 0.6 mg/dL (ref 0.0–1.2)
Bilirubin, Direct: 0.16 mg/dL (ref 0.00–0.40)
Total Protein: 6.3 g/dL (ref 6.0–8.5)

## 2019-07-08 LAB — LIPID PANEL WITH LDL/HDL RATIO
Cholesterol, Total: 126 mg/dL (ref 100–199)
HDL: 33 mg/dL — ABNORMAL LOW (ref >39)
LDL Calculated: 74 mg/dL (ref 0–99)
LDl/HDL Ratio: 2.2 ratio (ref 0.0–3.6)
Triglycerides: 95 mg/dL (ref 0–149)
VLDL Cholesterol Cal: 19 mg/dL (ref 5–40)

## 2019-07-27 ENCOUNTER — Other Ambulatory Visit: Payer: Self-pay | Admitting: Family Medicine

## 2019-07-27 DIAGNOSIS — M25552 Pain in left hip: Secondary | ICD-10-CM

## 2019-09-29 ENCOUNTER — Other Ambulatory Visit: Payer: Self-pay | Admitting: Surgery

## 2019-10-13 ENCOUNTER — Other Ambulatory Visit: Payer: Self-pay

## 2019-10-13 ENCOUNTER — Encounter
Admission: RE | Admit: 2019-10-13 | Discharge: 2019-10-13 | Disposition: A | Discharge status: Home / Self Care | Payer: Medicare Other | Source: Ambulatory Visit | Attending: Surgery | Admitting: Surgery

## 2019-10-13 DIAGNOSIS — I10 Essential (primary) hypertension: Secondary | ICD-10-CM | POA: Diagnosis not present

## 2019-10-13 DIAGNOSIS — R001 Bradycardia, unspecified: Secondary | ICD-10-CM | POA: Diagnosis not present

## 2019-10-13 DIAGNOSIS — Z01818 Encounter for other preprocedural examination: Secondary | ICD-10-CM | POA: Insufficient documentation

## 2019-10-13 HISTORY — DX: Unspecified osteoarthritis, unspecified site: M19.90

## 2019-10-13 LAB — SURGICAL PCR SCREEN
MRSA, PCR: NEGATIVE
Staphylococcus aureus: NEGATIVE

## 2019-10-13 LAB — BASIC METABOLIC PANEL
Anion gap: 10 (ref 5–15)
BUN: 23 mg/dL (ref 8–23)
CO2: 25 mmol/L (ref 22–32)
Calcium: 9.8 mg/dL (ref 8.9–10.3)
Chloride: 102 mmol/L (ref 98–111)
Creatinine, Ser: 1.07 mg/dL (ref 0.61–1.24)
GFR calc Af Amer: >60 mL/min (ref >60)
GFR calc non Af Amer: >60 mL/min (ref >60)
Glucose, Bld: 102 mg/dL — ABNORMAL HIGH (ref 70–99)
Potassium: 4.3 mmol/L (ref 3.5–5.1)
Sodium: 137 mmol/L (ref 135–145)

## 2019-10-13 LAB — URINALYSIS, ROUTINE W REFLEX MICROSCOPIC
Bilirubin Urine: NEGATIVE
Glucose, UA: NEGATIVE mg/dL
Hgb urine dipstick: NEGATIVE
Ketones, ur: NEGATIVE mg/dL
Leukocytes,Ua: NEGATIVE
Nitrite: NEGATIVE
Protein, ur: NEGATIVE mg/dL
Specific Gravity, Urine: 1.006 (ref 1.005–1.030)
pH: 7 (ref 5.0–8.0)

## 2019-10-13 LAB — CBC
HCT: 36.6 % — ABNORMAL LOW (ref 39.0–52.0)
Hemoglobin: 12.4 g/dL — ABNORMAL LOW (ref 13.0–17.0)
MCH: 32.6 pg (ref 26.0–34.0)
MCHC: 33.9 g/dL (ref 30.0–36.0)
MCV: 96.3 fL (ref 80.0–100.0)
Platelets: 163 10*3/uL (ref 150–400)
RBC: 3.8 MIL/uL — ABNORMAL LOW (ref 4.22–5.81)
RDW: 13.1 % (ref 11.5–15.5)
WBC: 5.9 10*3/uL (ref 4.0–10.5)
nRBC: 0 % (ref 0.0–0.2)

## 2019-10-13 LAB — TYPE AND SCREEN
ABO/RH(D): A POS
Antibody Screen: NEGATIVE

## 2019-10-13 NOTE — Patient Instructions (Signed)
Your procedure is scheduled on: 10-20-19 THURSDAY Report to Same Day Surgery 2nd floor medical mall [facility] Entrance-take elevator on left to 2nd floor.  Check in with surgery information desk.) To find out your arrival time please call [phone] between 1PM - 3PM on 10-19-19 [facility]  Remember: Instructions that are not followed completely may result in serious medical risk, up to and including death, or upon the discretion of your surgeon and anesthesiologist your surgery may need to be rescheduled.    _x___ 1. Do not eat food after midnight the night before your procedure. NO GUM OR CANDY. You may drink clear liquids up to 2 hours before you are scheduled to arrive at the hospital for your procedure.  Do not drink clear liquids within 2 hours of your scheduled arrival to the hospital.  Clear liquids include  --Water or Apple juice without pulp  --Clear carbohydrate beverage such as ClearFast or Gatorade  --Black Coffee or Clear Tea (No milk, no creamers, do not add anything to the coffee or Tea   ____Ensure clear carbohydrate drink on the way to the hospital for bariatric patients  ____Ensure clear carbohydrate drink 3 hours before surgery.    __x__ 2. No Alcohol for 24 hours before or after surgery.   __x__3. No Smoking or e-cigarettes for 24 prior to surgery.  Do not use any chewable tobacco products for at least 6 hour prior to surgery   ____  4. Bring all medications with you on the day of surgery if instructed.    __x__ 5. Notify your doctor if there is any change in your medical condition     (cold, fever, infections).    x___6. On the morning of surgery brush your teeth with toothpaste and water.  You may rinse your mouth with mouth wash if you wish.  Do not swallow any toothpaste or mouthwash.   Do not wear jewelry, make-up, hairpins, clips or nail polish.  Do not wear lotions, powders, or perfumes. You may wear deodorant.  Do not shave 48 hours prior to surgery.  Men may shave face and neck.  Do not bring valuables to the hospital.    [facility] is not responsible for any belongings or valuables.               Contacts, dentures or bridgework may not be worn into surgery.  Leave your suitcase in the car. After surgery it may be brought to your room.  For patients admitted to the hospital, discharge time is determined by your treatment team.  _  Patients discharged the day of surgery will not be allowed to drive home.  You will need someone to drive you home and stay with you the night of your procedure.    Please read over the following fact sheets that you were given:   [facility] Preparing for Surgery and or MRSA Information   _x___ TAKE THE FOLLOWING MEDICATION THE MORNING OF SURGERY WITH A SMALL SIP OF WATER. These include:  1. ATENOLOL  2.  3.  4.  5.  6.  ____Fleets enema or Magnesium Citrate as directed.   _x___ Use CHG Soap or sage wipes as directed on instruction sheet   ____ Use inhalers on the day of surgery and bring to hospital day of surgery  ____ Stop Metformin and Janumet 2 days prior to surgery.    ____ Take 1/2 of usual insulin dose the night before surgery and none on the morning surgery.  _x___ Follow recommendations from Cardiologist, Pulmonologist or PCP regarding stopping Aspirin, Coumadin, Plavix ,Eliquis, Effient, or Pradaxa, and Pletal-STOP ASPIRIN NOW  X____Stop Anti-inflammatories such as Advil, Aleve, Ibuprofen, Motrin, Naproxen,MELOXICAM (MOBIC), Naprosyn, Goodies powders or aspirin products NOW- OK to take Tylenol    _x___ Stop supplements until after surgery-STOP MELATONIN NOW-MAY RESUME AFTER SURGERY   ____ Bring C-Pap to the hospital.

## 2019-10-14 LAB — URINE CULTURE: Culture: <10000 — AB

## 2019-10-17 ENCOUNTER — Other Ambulatory Visit: Payer: Self-pay

## 2019-10-17 ENCOUNTER — Other Ambulatory Visit
Admission: RE | Admit: 2019-10-17 | Discharge: 2019-10-17 | Disposition: A | Discharge status: Home / Self Care | Payer: Medicare Other | Source: Ambulatory Visit | Attending: Surgery | Admitting: Surgery

## 2019-10-17 DIAGNOSIS — Z20828 Contact with and (suspected) exposure to other viral communicable diseases: Secondary | ICD-10-CM | POA: Insufficient documentation

## 2019-10-17 DIAGNOSIS — Z01812 Encounter for preprocedural laboratory examination: Secondary | ICD-10-CM | POA: Insufficient documentation

## 2019-10-17 LAB — SARS CORONAVIRUS 2 (TAT 6-24 HRS): SARS Coronavirus 2: NEGATIVE

## 2019-10-20 ENCOUNTER — Inpatient Hospital Stay
Admission: AD | Admit: 2019-10-20 | Discharge: 2019-10-22 | DRG: 470 | Disposition: A | Discharge status: Home Health | Payer: Medicare Other | Source: Ambulatory Visit | Attending: Surgery | Admitting: Surgery

## 2019-10-20 ENCOUNTER — Ambulatory Visit: Payer: Medicare Other | Admitting: Certified Registered Nurse Anesthetist

## 2019-10-20 ENCOUNTER — Other Ambulatory Visit: Payer: Self-pay

## 2019-10-20 ENCOUNTER — Inpatient Hospital Stay: Payer: Medicare Other

## 2019-10-20 ENCOUNTER — Encounter
Admission: AD | Disposition: A | Discharge status: Home / Self Care | Payer: Self-pay | Source: Ambulatory Visit | Attending: Surgery

## 2019-10-20 DIAGNOSIS — Z01812 Encounter for preprocedural laboratory examination: Secondary | ICD-10-CM | POA: Diagnosis not present

## 2019-10-20 DIAGNOSIS — M25562 Pain in left knee: Secondary | ICD-10-CM | POA: Diagnosis present

## 2019-10-20 DIAGNOSIS — Z96641 Presence of right artificial hip joint: Secondary | ICD-10-CM | POA: Diagnosis present

## 2019-10-20 DIAGNOSIS — Z8249 Family history of ischemic heart disease and other diseases of the circulatory system: Secondary | ICD-10-CM

## 2019-10-20 DIAGNOSIS — Z79899 Other long term (current) drug therapy: Secondary | ICD-10-CM | POA: Diagnosis not present

## 2019-10-20 DIAGNOSIS — Z20828 Contact with and (suspected) exposure to other viral communicable diseases: Secondary | ICD-10-CM | POA: Diagnosis present

## 2019-10-20 DIAGNOSIS — Z7982 Long term (current) use of aspirin: Secondary | ICD-10-CM

## 2019-10-20 DIAGNOSIS — G7000 Myasthenia gravis without (acute) exacerbation: Secondary | ICD-10-CM | POA: Diagnosis present

## 2019-10-20 DIAGNOSIS — M25552 Pain in left hip: Secondary | ICD-10-CM | POA: Diagnosis present

## 2019-10-20 DIAGNOSIS — I10 Essential (primary) hypertension: Secondary | ICD-10-CM | POA: Diagnosis present

## 2019-10-20 DIAGNOSIS — E785 Hyperlipidemia, unspecified: Secondary | ICD-10-CM | POA: Diagnosis present

## 2019-10-20 DIAGNOSIS — M1712 Unilateral primary osteoarthritis, left knee: Principal | ICD-10-CM | POA: Diagnosis present

## 2019-10-20 DIAGNOSIS — Z96652 Presence of left artificial knee joint: Secondary | ICD-10-CM

## 2019-10-20 HISTORY — PX: TOTAL KNEE ARTHROPLASTY: SHX125

## 2019-10-20 SURGERY — ARTHROPLASTY, KNEE, TOTAL
Anesthesia: Spinal | Site: Knee | Laterality: Left

## 2019-10-20 MED ORDER — CEFAZOLIN SODIUM-DEXTROSE 2-4 GM/100ML-% IV SOLN
2.0000 g | Freq: Four times a day (QID) | INTRAVENOUS | Status: AC
  Administered 2019-10-20 – 2019-10-21 (×2): 2 g via INTRAVENOUS
  Filled 2019-10-20 (×2): qty 100

## 2019-10-20 MED ORDER — CEFAZOLIN SODIUM-DEXTROSE 2-4 GM/100ML-% IV SOLN
2.0000 g | Freq: Once | INTRAVENOUS | Status: AC
  Administered 2019-10-20: 08:00:00 2 g via INTRAVENOUS

## 2019-10-20 MED ORDER — TRAMADOL HCL 50 MG PO TABS
50.0000 mg | ORAL_TABLET | Freq: Four times a day (QID) | ORAL | Status: DC | PRN
  Administered 2019-10-20 – 2019-10-22 (×3): 50 mg via ORAL
  Filled 2019-10-20 (×2): qty 1

## 2019-10-20 MED ORDER — LIDOCAINE HCL (PF) 2 % IJ SOLN
INTRAMUSCULAR | Status: AC
  Filled 2019-10-20: qty 10

## 2019-10-20 MED ORDER — ACETAMINOPHEN 10 MG/ML IV SOLN
INTRAVENOUS | Status: DC | PRN
  Administered 2019-10-20: 1000 mg via INTRAVENOUS

## 2019-10-20 MED ORDER — ASPIRIN EC 81 MG PO TBEC
81.0000 mg | DELAYED_RELEASE_TABLET | Freq: Every day | ORAL | Status: DC
  Administered 2019-10-21 – 2019-10-22 (×2): 81 mg via ORAL
  Filled 2019-10-20 (×2): qty 1

## 2019-10-20 MED ORDER — KETOROLAC TROMETHAMINE 15 MG/ML IJ SOLN
INTRAMUSCULAR | Status: AC
  Administered 2019-10-20: 10:00:00 15 mg via INTRAVENOUS
  Filled 2019-10-20: qty 1

## 2019-10-20 MED ORDER — MELATONIN 5 MG PO TABS
2.5000 mg | ORAL_TABLET | Freq: Every evening | ORAL | Status: DC | PRN
  Filled 2019-10-20: qty 0.5

## 2019-10-20 MED ORDER — ADULT MULTIVITAMIN W/MINERALS CH
1.0000 | ORAL_TABLET | Freq: Every day | ORAL | Status: DC
  Administered 2019-10-21 – 2019-10-22 (×2): 1 via ORAL
  Filled 2019-10-20 (×2): qty 1

## 2019-10-20 MED ORDER — CEFAZOLIN SODIUM-DEXTROSE 2-4 GM/100ML-% IV SOLN
INTRAVENOUS | Status: AC
  Filled 2019-10-20: qty 100

## 2019-10-20 MED ORDER — TRANEXAMIC ACID 1000 MG/10ML IV SOLN
INTRAVENOUS | Status: AC
  Filled 2019-10-20: qty 10

## 2019-10-20 MED ORDER — ONDANSETRON HCL 4 MG PO TABS
4.0000 mg | ORAL_TABLET | Freq: Four times a day (QID) | ORAL | Status: DC | PRN
  Administered 2019-10-21 (×2): 4 mg via ORAL
  Filled 2019-10-20 (×2): qty 1

## 2019-10-20 MED ORDER — METOCLOPRAMIDE HCL 10 MG PO TABS: 5.0000 mg | ORAL_TABLET | Freq: Three times a day (TID) | ORAL | Status: DC | PRN

## 2019-10-20 MED ORDER — ATENOLOL 50 MG PO TABS
50.0000 mg | ORAL_TABLET | Freq: Two times a day (BID) | ORAL | Status: DC
  Administered 2019-10-21 – 2019-10-22 (×3): 50 mg via ORAL
  Filled 2019-10-20 (×2): qty 1
  Filled 2019-10-20: qty 2
  Filled 2019-10-20: qty 1
  Filled 2019-10-20: qty 2
  Filled 2019-10-20: qty 1
  Filled 2019-10-20: qty 2
  Filled 2019-10-20: qty 1

## 2019-10-20 MED ORDER — OMEGA-3-ACID ETHYL ESTERS 1 G PO CAPS
2.0000 g | ORAL_CAPSULE | Freq: Two times a day (BID) | ORAL | Status: DC
  Administered 2019-10-20 – 2019-10-22 (×4): 2 g via ORAL
  Filled 2019-10-20 (×4): qty 2

## 2019-10-20 MED ORDER — PROPOFOL 10 MG/ML IV BOLUS
INTRAVENOUS | Status: DC | PRN
  Administered 2019-10-20: 40 mg via INTRAVENOUS

## 2019-10-20 MED ORDER — GLYCOPYRROLATE 0.2 MG/ML IJ SOLN
INTRAMUSCULAR | Status: DC | PRN
  Administered 2019-10-20: 0.2 mg via INTRAVENOUS

## 2019-10-20 MED ORDER — PROPOFOL 500 MG/50ML IV EMUL
INTRAVENOUS | Status: DC | PRN
  Administered 2019-10-20: 50 ug/kg/min via INTRAVENOUS

## 2019-10-20 MED ORDER — EPHEDRINE SULFATE 50 MG/ML IJ SOLN
INTRAMUSCULAR | Status: AC
  Filled 2019-10-20: qty 1

## 2019-10-20 MED ORDER — BUPIVACAINE LIPOSOME 1.3 % IJ SUSP
INTRAMUSCULAR | Status: AC
  Filled 2019-10-20: qty 20

## 2019-10-20 MED ORDER — ENOXAPARIN SODIUM 40 MG/0.4ML ~~LOC~~ SOLN
40.0000 mg | SUBCUTANEOUS | Status: DC
  Administered 2019-10-21 – 2019-10-22 (×2): 40 mg via SUBCUTANEOUS
  Filled 2019-10-20 (×2): qty 0.4

## 2019-10-20 MED ORDER — FENOFIBRATE 54 MG PO TABS
54.0000 mg | ORAL_TABLET | Freq: Every day | ORAL | Status: DC
  Administered 2019-10-21: 09:00:00 54 mg via ORAL
  Filled 2019-10-20 (×2): qty 1

## 2019-10-20 MED ORDER — ONDANSETRON HCL 4 MG/2ML IJ SOLN: 4.0000 mg | Freq: Four times a day (QID) | INTRAMUSCULAR | Status: DC | PRN

## 2019-10-20 MED ORDER — ACETAMINOPHEN 325 MG PO TABS: 325.0000 mg | ORAL_TABLET | Freq: Four times a day (QID) | ORAL | Status: DC | PRN

## 2019-10-20 MED ORDER — PROPOFOL 10 MG/ML IV BOLUS
INTRAVENOUS | Status: AC
  Filled 2019-10-20: qty 80

## 2019-10-20 MED ORDER — OXYCODONE HCL 5 MG PO TABS
5.0000 mg | ORAL_TABLET | ORAL | Status: DC | PRN
  Administered 2019-10-20 – 2019-10-21 (×2): 5 mg via ORAL
  Administered 2019-10-21: 10 mg via ORAL
  Filled 2019-10-20: qty 1
  Filled 2019-10-20 (×2): qty 2

## 2019-10-20 MED ORDER — TRANEXAMIC ACID 1000 MG/10ML IV SOLN
INTRAVENOUS | Status: DC | PRN
  Administered 2019-10-20: 1000 mg via TOPICAL

## 2019-10-20 MED ORDER — FLEET ENEMA 7-19 GM/118ML RE ENEM: 1.0000 | ENEMA | Freq: Once | RECTAL | Status: DC | PRN

## 2019-10-20 MED ORDER — FENTANYL CITRATE (PF) 100 MCG/2ML IJ SOLN
INTRAMUSCULAR | Status: DC | PRN
  Administered 2019-10-20 (×2): 25 ug via INTRAVENOUS

## 2019-10-20 MED ORDER — KETOROLAC TROMETHAMINE 15 MG/ML IJ SOLN
15.0000 mg | Freq: Once | INTRAMUSCULAR | Status: AC
  Administered 2019-10-20: 10:00:00 15 mg via INTRAVENOUS

## 2019-10-20 MED ORDER — PHENYLEPHRINE HCL (PRESSORS) 10 MG/ML IV SOLN
INTRAVENOUS | Status: AC
  Filled 2019-10-20: qty 1

## 2019-10-20 MED ORDER — ACETAMINOPHEN 500 MG PO TABS
1000.0000 mg | ORAL_TABLET | Freq: Four times a day (QID) | ORAL | Status: AC
  Administered 2019-10-20 – 2019-10-21 (×2): 1000 mg via ORAL
  Filled 2019-10-20 (×2): qty 2

## 2019-10-20 MED ORDER — OMEGA-3-ACID ETHYL ESTERS 1 G PO CAPS: 1.0000 g | ORAL_CAPSULE | Freq: Every day | ORAL | Status: DC

## 2019-10-20 MED ORDER — FAMOTIDINE 20 MG PO TABS
20.0000 mg | ORAL_TABLET | Freq: Once | ORAL | Status: AC
  Administered 2019-10-20: 07:00:00 20 mg via ORAL

## 2019-10-20 MED ORDER — LISINOPRIL 10 MG PO TABS
10.0000 mg | ORAL_TABLET | Freq: Every day | ORAL | Status: DC
  Administered 2019-10-21 – 2019-10-22 (×2): 10 mg via ORAL
  Filled 2019-10-20 (×2): qty 1

## 2019-10-20 MED ORDER — EPINEPHRINE PF 1 MG/ML IJ SOLN
INTRAMUSCULAR | Status: AC
  Filled 2019-10-20: qty 1

## 2019-10-20 MED ORDER — BISACODYL 10 MG RE SUPP
10.0000 mg | Freq: Every day | RECTAL | Status: DC | PRN
  Administered 2019-10-22: 13:00:00 10 mg via RECTAL
  Filled 2019-10-20: qty 1

## 2019-10-20 MED ORDER — DOCUSATE SODIUM 100 MG PO CAPS
100.0000 mg | ORAL_CAPSULE | Freq: Two times a day (BID) | ORAL | Status: DC
  Administered 2019-10-20 – 2019-10-22 (×4): 100 mg via ORAL
  Filled 2019-10-20 (×5): qty 1

## 2019-10-20 MED ORDER — ACETAMINOPHEN 10 MG/ML IV SOLN
INTRAVENOUS | Status: AC
  Filled 2019-10-20: qty 100

## 2019-10-20 MED ORDER — PHENYLEPHRINE HCL-NACL 10-0.9 MG/250ML-% IV SOLN
INTRAVENOUS | Status: DC | PRN
  Administered 2019-10-20: 25 ug/min via INTRAVENOUS

## 2019-10-20 MED ORDER — SODIUM CHLORIDE 0.9 % IV SOLN
INTRAVENOUS | Status: DC
  Administered 2019-10-20 – 2019-10-21 (×2): via INTRAVENOUS

## 2019-10-20 MED ORDER — SODIUM CHLORIDE FLUSH 0.9 % IV SOLN
INTRAVENOUS | Status: AC
  Filled 2019-10-20: qty 40

## 2019-10-20 MED ORDER — KETOROLAC TROMETHAMINE 30 MG/ML IJ SOLN
7.5000 mg | Freq: Four times a day (QID) | INTRAMUSCULAR | Status: AC
  Administered 2019-10-20 – 2019-10-21 (×2): 7.5 mg via INTRAVENOUS
  Filled 2019-10-20 (×2): qty 1

## 2019-10-20 MED ORDER — PHENYLEPHRINE HCL (PRESSORS) 10 MG/ML IV SOLN: INTRAVENOUS | Status: DC | PRN

## 2019-10-20 MED ORDER — BUPIVACAINE HCL (PF) 0.5 % IJ SOLN
INTRAMUSCULAR | Status: DC | PRN
  Administered 2019-10-20: 3 mL

## 2019-10-20 MED ORDER — METOCLOPRAMIDE HCL 5 MG/ML IJ SOLN: 5.0000 mg | Freq: Three times a day (TID) | INTRAMUSCULAR | Status: DC | PRN

## 2019-10-20 MED ORDER — DIPHENHYDRAMINE HCL 12.5 MG/5ML PO ELIX: 12.5000 mg | ORAL_SOLUTION | ORAL | Status: DC | PRN

## 2019-10-20 MED ORDER — LACTATED RINGERS IV SOLN
INTRAVENOUS | Status: DC
  Administered 2019-10-20 (×3): via INTRAVENOUS

## 2019-10-20 MED ORDER — FOLIC ACID 1 MG PO TABS
1.0000 mg | ORAL_TABLET | Freq: Every day | ORAL | Status: DC
  Administered 2019-10-21 – 2019-10-22 (×2): 1 mg via ORAL
  Filled 2019-10-20 (×2): qty 1

## 2019-10-20 MED ORDER — EPHEDRINE SULFATE 50 MG/ML IJ SOLN
INTRAMUSCULAR | Status: DC | PRN
  Administered 2019-10-20: 10 mg via INTRAVENOUS

## 2019-10-20 MED ORDER — MAGNESIUM HYDROXIDE 400 MG/5ML PO SUSP
30.0000 mL | Freq: Every day | ORAL | Status: DC | PRN
  Administered 2019-10-22: 13:00:00 30 mL via ORAL
  Filled 2019-10-20: qty 30

## 2019-10-20 MED ORDER — HYDROMORPHONE HCL 1 MG/ML IJ SOLN: 0.2500 mg | INTRAMUSCULAR | Status: DC | PRN

## 2019-10-20 MED ORDER — FENTANYL CITRATE (PF) 100 MCG/2ML IJ SOLN
INTRAMUSCULAR | Status: AC
  Filled 2019-10-20: qty 2

## 2019-10-20 MED ORDER — SODIUM CHLORIDE 0.9 % IV SOLN
INTRAVENOUS | Status: DC | PRN
  Administered 2019-10-20: 09:00:00 60 mL

## 2019-10-20 MED ORDER — FAMOTIDINE 20 MG PO TABS
ORAL_TABLET | ORAL | Status: AC
  Administered 2019-10-20: 20 mg via ORAL
  Filled 2019-10-20: qty 1

## 2019-10-20 MED ORDER — ONDANSETRON HCL 4 MG/2ML IJ SOLN: 4.0000 mg | Freq: Once | INTRAMUSCULAR | Status: DC | PRN

## 2019-10-20 MED ORDER — TRAMADOL HCL 50 MG PO TABS
ORAL_TABLET | ORAL | Status: AC
  Administered 2019-10-20: 50 mg
  Filled 2019-10-20: qty 1

## 2019-10-20 MED ORDER — PANTOPRAZOLE SODIUM 40 MG PO TBEC
40.0000 mg | DELAYED_RELEASE_TABLET | Freq: Every day | ORAL | Status: DC
  Administered 2019-10-20 – 2019-10-22 (×3): 40 mg via ORAL
  Filled 2019-10-20 (×3): qty 1

## 2019-10-20 MED ORDER — LIDOCAINE HCL (CARDIAC) PF 100 MG/5ML IV SOSY
PREFILLED_SYRINGE | INTRAVENOUS | Status: DC | PRN
  Administered 2019-10-20: 40 mg via INTRAVENOUS

## 2019-10-20 MED ORDER — FENTANYL CITRATE (PF) 100 MCG/2ML IJ SOLN: 25.0000 ug | INTRAMUSCULAR | Status: DC | PRN

## 2019-10-20 MED ORDER — BUPIVACAINE-EPINEPHRINE (PF) 0.5% -1:200000 IJ SOLN
INTRAMUSCULAR | Status: DC | PRN
  Administered 2019-10-20: 30 mL via PERINEURAL

## 2019-10-20 MED ORDER — ONDANSETRON HCL 4 MG/2ML IJ SOLN
INTRAMUSCULAR | Status: DC | PRN
  Administered 2019-10-20: 4 mg via INTRAVENOUS

## 2019-10-20 MED ORDER — BUPIVACAINE HCL (PF) 0.5 % IJ SOLN
INTRAMUSCULAR | Status: AC
  Filled 2019-10-20: qty 30

## 2019-10-20 SURGICAL SUPPLY — 62 items
BLADE SAW SAG 25X90X1.19 (BLADE) ×3 IMPLANT
BLADE SURG SZ20 CARB STEEL (BLADE) ×3 IMPLANT
BNDG ELASTIC 6X5.8 VLCR NS LF (GAUZE/BANDAGES/DRESSINGS) ×3 IMPLANT
CANISTER SUCT 1200ML W/VALVE (MISCELLANEOUS) ×3 IMPLANT
CANISTER SUCT 3000ML PPV (MISCELLANEOUS) ×3 IMPLANT
CEMENT BONE R 1X40 (Cement) ×6 IMPLANT
CEMENT VACUUM MIXING SYSTEM (MISCELLANEOUS) ×3 IMPLANT
CHLORAPREP W/TINT 26 (MISCELLANEOUS) ×3 IMPLANT
COOLER POLAR GLACIER W/PUMP (MISCELLANEOUS) ×3 IMPLANT
COVER MAYO STAND REUSABLE (DRAPES) ×3 IMPLANT
COVER WAND RF STERILE (DRAPES) ×3 IMPLANT
CUFF TOURN SGL QUICK 24 (TOURNIQUET CUFF) ×2
CUFF TOURN SGL QUICK 30 (TOURNIQUET CUFF)
CUFF TRNQT CYL 24X4X16.5-23 (TOURNIQUET CUFF) IMPLANT
CUFF TRNQT CYL 30X4X21-28X (TOURNIQUET CUFF) IMPLANT
DRAPE 3/4 80X56 (DRAPES) ×3 IMPLANT
DRAPE SPLIT 6X30 W/TAPE (DRAPES) ×3 IMPLANT
DRSG OPSITE POSTOP 4X10 (GAUZE/BANDAGES/DRESSINGS) ×3 IMPLANT
DRSG OPSITE POSTOP 4X8 (GAUZE/BANDAGES/DRESSINGS) ×3 IMPLANT
ELECT CAUTERY BLADE 6.4 (BLADE) ×3 IMPLANT
ELECT REM PT RETURN 9FT ADLT (ELECTROSURGICAL) ×3
ELECTRODE REM PT RTRN 9FT ADLT (ELECTROSURGICAL) ×1 IMPLANT
FEMORAL CR LEFT 72.5 (Joint) ×2 IMPLANT
GLOVE BIO SURGEON STRL SZ7.5 (GLOVE) ×12 IMPLANT
GLOVE BIO SURGEON STRL SZ8 (GLOVE) ×12 IMPLANT
GLOVE BIOGEL PI IND STRL 8 (GLOVE) ×1 IMPLANT
GLOVE BIOGEL PI INDICATOR 8 (GLOVE) ×2
GLOVE INDICATOR 8.0 STRL GRN (GLOVE) ×3 IMPLANT
GOWN STRL REUS W/ TWL LRG LVL3 (GOWN DISPOSABLE) ×1 IMPLANT
GOWN STRL REUS W/ TWL XL LVL3 (GOWN DISPOSABLE) ×1 IMPLANT
GOWN STRL REUS W/TWL LRG LVL3 (GOWN DISPOSABLE) ×2
GOWN STRL REUS W/TWL XL LVL3 (GOWN DISPOSABLE) ×2
HOLDER FOLEY CATH W/STRAP (MISCELLANEOUS) ×3 IMPLANT
HOOD PEEL AWAY FLYTE STAYCOOL (MISCELLANEOUS) ×11 IMPLANT
INSERT TIB BEARING 78X12 (Insert) ×2 IMPLANT
KIT TURNOVER KIT A (KITS) ×3 IMPLANT
NDL SAFETY ECLIPSE 18X1.5 (NEEDLE) ×2 IMPLANT
NDL SPNL 20GX3.5 QUINCKE YW (NEEDLE) ×1 IMPLANT
NEEDLE HYPO 18GX1.5 SHARP (NEEDLE) ×4
NEEDLE SPNL 20GX3.5 QUINCKE YW (NEEDLE) ×3 IMPLANT
NS IRRIG 1000ML POUR BTL (IV SOLUTION) ×3 IMPLANT
PACK TOTAL KNEE (MISCELLANEOUS) ×3 IMPLANT
PAD WRAPON POLAR KNEE (MISCELLANEOUS) ×1 IMPLANT
PEG PATELLA SERIES A 37MMX10MM (Orthopedic Implant) ×2 IMPLANT
PENCIL SMOKE EVACUATOR (MISCELLANEOUS) ×2 IMPLANT
PLATE KNEE TIBIAL 79MM FIXED (Plate) ×2 IMPLANT
PULSAVAC PLUS IRRIG FAN TIP (DISPOSABLE) ×3
SOL .9 NS 3000ML IRR  AL (IV SOLUTION) ×2
SOL .9 NS 3000ML IRR UROMATIC (IV SOLUTION) ×1 IMPLANT
STAPLER SKIN PROX 35W (STAPLE) ×3 IMPLANT
SUCTION FRAZIER HANDLE 10FR (MISCELLANEOUS) ×2
SUCTION TUBE FRAZIER 10FR DISP (MISCELLANEOUS) ×1 IMPLANT
SUT VIC AB 0 CT1 36 (SUTURE) ×9 IMPLANT
SUT VIC AB 2-0 CT1 27 (SUTURE) ×4
SUT VIC AB 2-0 CT1 TAPERPNT 27 (SUTURE) ×3 IMPLANT
SYR 10ML LL (SYRINGE) ×3 IMPLANT
SYR 20ML LL LF (SYRINGE) ×3 IMPLANT
SYR 30ML LL (SYRINGE) ×9 IMPLANT
TIP FAN IRRIG PULSAVAC PLUS (DISPOSABLE) ×1 IMPLANT
TRAP FLUID SMOKE EVACUATOR (MISCELLANEOUS) ×2 IMPLANT
TRAY FOLEY MTR SLVR 16FR STAT (SET/KITS/TRAYS/PACK) ×3 IMPLANT
WRAPON POLAR PAD KNEE (MISCELLANEOUS) ×3

## 2019-10-20 NOTE — H&P (Signed)
Paper H&P to be scanned into permanent record. H&P reviewed and patient re-examined. No changes. 

## 2019-10-20 NOTE — Anesthesia Procedure Notes (Signed)
Spinal  Patient location during procedure: OR Start time: 10/20/2019 7:30 AM End time: 10/20/2019 7:36 AM Staffing Anesthesiologist: Molli Barrows, MD Resident/CRNA: Lowry Bowl, CRNA Performed: resident/CRNA  Preanesthetic Checklist Completed: patient identified, site marked, surgical consent, pre-op evaluation, timeout performed, IV checked, risks and benefits discussed and monitors and equipment checked Spinal Block Patient position: sitting Prep: DuraPrep Patient monitoring: heart rate, cardiac monitor, continuous pulse ox and blood pressure Approach: midline Location: L3-4 Injection technique: single-shot Needle Needle type: Sprotte  Needle gauge: 24 G Needle length: 9 cm Assessment Sensory level: T4

## 2019-10-20 NOTE — Op Note (Signed)
10/20/2019  9:55 AM  Patient:   Nicholas Monroe  Pre-Op Diagnosis:   Degenerative joint disease, left knee.  Post-Op Diagnosis:   Same  Procedure:   Left TKA using all-cemented Bi[employer] Vanguard system with a 72.5 mm PCR femur, a 79 mm tibial tray with a 12 mm anterior stabilized E-poly insert, and a 37 x 10 mm all-poly 3-pegged domed patella.  Surgeon:   Pascal Lux, MD  Assistant:   Cameron Proud, PA-C; Talitha Givens, PA-S  Anesthesia:   Spinal  Findings:   As above  Complications:   None  EBL:   10 cc  Fluids:   1300 cc crystalloid  UOP:   500 cc  TT:   95 minutes at 300 mmHg  Drains:   None  Closure:   Staples  Implants:   As above  Brief Clinical Note:   The patient is a 78 year old male with a long history of progressively worsening left knee pain. The patient's symptoms have progressed despite medications, activity modification, injections, etc. 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 The patient presents at this time for a left total knee arthroplasty.  Procedure:   The patient was brought into the operating room. After adequate spinal anesthesia was obtained, the patient was lain in the supine position. A Foley catheter was placed by the nurse before the right lower extremity was prepped with ChloraPrep solution and draped sterilely. Preoperative antibiotics were administered. After verifying the proper laterality with a surgical timeout, the limb was exsanguinated with an Esmarch and the tourniquet inflated to 300 mmHg. A standard anterior approach to the knee was made through an approximately 7 inch incision. The incision was carried down through the subcutaneous tissues to expose superficial retinaculum. This was split the length of the incision and the medial flap elevated sufficiently to expose the medial retinaculum. The medial retinaculum was incised, leaving  a 3-4 mm cuff of tissue on the patella. This was extended distally along the medial border of the patellar tendon and proximally through the medial third of the quadriceps tendon. A subtotal fat pad excision was performed before the soft tissues were elevated off the anteromedial and anterolateral aspects of the proximal tibia to the level of the collateral ligaments. The anterior portions of the medial and lateral menisci were removed, as was the anterior cruciate ligament. With the knee flexed to 90, the external tibial guide was positioned and the appropriate proximal tibial cut made. This piece was taken to the back table where it was measured and found to be optimally replicated by a 79 mm component.  Attention was directed to the distal femur. The intramedullary canal was accessed through a 3/8" drill hole. The intramedullary guide was inserted and positioned in order to obtain a neutral flexion gap. The intercondylar block was positioned with care taken to avoid notching the anterior cortex of the femur. The appropriate cut was made. Next, the distal cutting block was placed at 6 of valgus alignment. Using the 9 mm slot, the distal cut was made. The distal femur was measured and found to be optimally replicated by the [id number] mm component. The 72.5 mm 4-in-1 cutting block was positioned and first the posterior, then the posterior chamfer, the anterior chamfer, and finally the anterior cuts were made. At this point, the posterior portions medial and lateral menisci were removed. A trial reduction was performed using the appropriate femoral and tibial  components with first the 10 mm and then the 12 mm insert. The 12 mm insert demonstrated excellent stability to varus and valgus stressing both in flexion and extension while permitting full extension. Patella tracking was assessed and found to be excellent. Therefore, the tibial guide position was marked on the proximal tibia. The patella thickness was measured  and found to be 20 mm. Therefore, the appropriate cut was made. The patellar surface was measured and found to be optimally replicated by the 37 mm component. The three peg holes were drilled in place before the trial button was inserted. Patella tracking was assessed and found to be excellent, passing the "no thumb test". The lug holes were drilled into the distal femur before the trial component was removed, leaving only the tibial tray. The keel was then created using the appropriate tower, reamer, and punch.  The bony surfaces were prepared for cementing by irrigating them thoroughly with bacitracin saline solution via the jet lavage system. A bone plug was fashioned from some of the bone that had been removed previously and used to plug the distal femoral canal. In addition, 20 cc of Exparel diluted out to 60 cc with normal saline and 30 cc of 0.5% Sensorcaine were injected into the postero-medial and postero-lateral aspects of the knee, the medial and lateral gutter regions, and the peri-incisional tissues to help with postoperative analgesia. Meanwhile, the cement was being mixed on the back table. When it was ready, the tibial tray was cemented in first. The excess cement was removed using [employer] fast streamer. Next, the femoral component was impacted into place. Again, the excess cement was removed using [employer] fast streamer. The 12 mm trial insert was positioned and the knee brought into extension while the cement hardened. Finally, the patella was cemented into place and secured using the patellar clamp. Again, the excess cement was removed using [employer] fast streamer. Once the cement had hardened, the knee was placed through a range of motion with the findings as described above. Therefore, the trial insert was removed and, after verifying that no cement had been retained posteriorly, the permanent 12 mm anterior stabilized E-polyethylene insert was positioned and secured using the appropriate key locking mechanism.  Again the knee was placed through a range of motion with the findings as described above.  The wound was copiously irrigated with sterile saline solution using the jet lavage system before the quadriceps tendon and retinacular layer were reapproximated using #0 Vicryl interrupted sutures. The superficial retinacular layer also was closed using a running #0 Vicryl suture. A total of 10 cc of transexemic acid (TXA) was injected intra-articularly before the subcutaneous tissues were closed in several layers using 2-0 Vicryl interrupted sutures. The skin was closed using staples. A sterile honeycomb dressing was applied to the skin before the leg was wrapped with an Ace wrap to accommodate the Polar Care device. The patient was then awakened and returned to the recovery room in satisfactory condition after tolerating the procedure well.

## 2019-10-20 NOTE — Anesthesia Post-op Follow-up Note (Signed)
Anesthesia QCDR form completed.        

## 2019-10-20 NOTE — Transfer of Care (Signed)
Immediate Anesthesia Transfer of Care Note  Patient: Nicholas Monroe  Procedure(s) Performed: TOTAL KNEE ARTHROPLASTY (Left Knee)  Patient Location: PACU  Anesthesia Type:Spinal  Level of Consciousness: awake, oriented, drowsy and patient cooperative  Airway & Oxygen Therapy: Patient Spontanous Breathing  Post-op Assessment: Report given to RN and Post -op Vital signs reviewed and stable  Post vital signs: Reviewed and stable  Last Vitals:  Vitals Value Taken Time  BP 106/64 10/20/19 1003  Temp    Pulse 55 10/20/19 1008  Resp 16 10/20/19 1008  SpO2 100 % 10/20/19 1008  Vitals shown include unvalidated device data.  Last Pain:  Vitals:   10/20/19 0628  TempSrc: Temporal  PainSc: 5          Complications: No apparent anesthesia complications

## 2019-10-20 NOTE — Evaluation (Signed)
Physical Therapy Evaluation Patient Details Name: Nicholas Monroe MRN: [id number] DOB: December 23, 1940 Today's Date: 10/20/2019   History of Present Illness  78 y/o male s/p R TKA 10/20/19.  Clinical Impression  Pt did well with first PT session POD0, he was able to do most exercises with some resistance, though fatigued very quickly with SLRs.  He was able to get to EOB and rise to standing w/o direct physical assist and managed to walk ~35 ft with heavy reliance on the walker but no safety concerns or excessive fatigue.  During some exercises and during L WBing pt had some significant audible arthritic rubbing-like sounds and did endorse some hip pain (along with need to have it replaced as well).  Overall pt did well with day of surgery PT session.      Follow Up Recommendations Home health PT    Equipment Recommendations  3in1 (PT)    Recommendations for Other Services       Precautions / Restrictions Precautions Precautions: Fall;Knee Restrictions Weight Bearing Restrictions: Yes LLE Weight Bearing: Weight bearing as tolerated      Mobility  Bed Mobility Overal bed mobility: Modified Independent             General bed mobility comments: Pt able to get to sitting EOB using UEs to assist L LE toward EOB  Transfers Overall transfer level: Needs assistance Equipment used: Rolling walker (2 wheeled) Transfers: Sit to/from Stand Sit to Stand: Min guard         General transfer comment: cuing for sequencing and set up, able to rise  w/o phyiscal assist  Ambulation/Gait Ambulation/Gait assistance: Min guard Gait Distance (Feet): 35 Feet Assistive device: Rolling walker (2 wheeled)       General Gait Details: Pt with slow, guarded, but ultimately safe ambulation.  Did repeatedly hear L hip crepitus/rubbing during [facility] Mobility    Modified Rankin (Stroke Patients Only)       Balance Overall balance assessment: Needs  assistance Sitting-balance support: No upper extremity supported Sitting balance-Leahy Scale: Good Sitting balance - Comments: no issues with static sitting balance   Standing balance support: Bilateral upper extremity supported Standing balance-Leahy Scale: Fair Standing balance comment: leaning on walker, no overt LOBs but guarded and lacking confidence                             Pertinent Vitals/Pain Pain Assessment: 0-10 Pain Score: 6  Pain Location: L knee    Home Living Family/patient expects to be discharged to:: Private residence Living Arrangements: Spouse/significant other Available Help at Discharge: Available 24 hours/day   Home Access: Stairs to enter Entrance Stairs-Rails: Left Entrance Stairs-Number of Steps: 3   Home Equipment: Walker - 2 wheels      Prior Function Level of Independence: Independent         Comments: Pt relatively active and independent     Hand Dominance        Extremity/Trunk Assessment   Upper Extremity Assessment Upper Extremity Assessment: Generalized weakness;Overall [facility] for tasks assessed    Lower Extremity Assessment Lower Extremity Assessment: Generalized weakness;Overall WFL for tasks assessed(expexcted post-op weakness, but able to SLR, etc)       Communication   Communication: No difficulties  Cognition Arousal/Alertness: Awake/alert Behavior During Therapy: WFL for tasks assessed/performed Overall Cognitive Status: Within Functional Limits for tasks assessed  General Comments: minimal low grade confusion (sx/meds?)      General Comments      Exercises Total Joint Exercises Ankle Circles/Pumps: AROM;10 reps Quad Sets: Strengthening;10 reps Short Arc Quad: AROM;10 reps Heel Slides: AROM;10 reps Hip ABduction/ADduction: 10 reps;AROM;Strengthening Straight Leg Raises: AROM;10 reps Knee Flexion: PROM;5 reps Goniometric ROM: 0-92    Assessment/Plan    PT Assessment Patient needs continued PT services  PT Problem List Decreased strength;Decreased range of motion;Decreased activity tolerance;Decreased balance;Decreased mobility;Decreased coordination;Decreased cognition;Decreased knowledge of use of DME;Decreased safety awareness;Pain       PT Treatment Interventions DME instruction;Gait training;Stair training;Functional mobility training;Therapeutic activities;Therapeutic exercise;Balance training;Neuromuscular re-education;Patient/family education    PT Goals (Current goals can be found in the Care Plan section)  Acute Rehab PT Goals Patient Stated Goal: go home PT Goal Formulation: With patient Time For Goal Achievement: 11/03/19 Potential to Achieve Goals: Good    Frequency BID   Barriers to discharge        Co-evaluation               AM-PAC PT "6 Clicks" Mobility  Outcome Measure Help needed turning from your back to your side while in a flat bed without using bedrails?: None Help needed moving from lying on your back to sitting on the side of a flat bed without using bedrails?: A Little Help needed moving to and from a bed to a chair (including a wheelchair)?: A Little Help needed standing up from a chair using your arms (e.g., wheelchair or bedside chair)?: A Little Help needed to walk in hospital room?: A Little   6 Click Score: 16    End of Session Equipment Utilized During Treatment: Gait belt Activity Tolerance: Patient tolerated treatment well Patient left: with call bell/phone within reach;with chair alarm set Nurse Communication: Mobility status PT Visit Diagnosis: Muscle weakness (generalized) (M62.81);Difficulty in walking, not elsewhere classified (R26.2);Pain Pain - Right/Left: Left Pain - part of body: Knee    Time: [id number] PT Time Calculation (min) (ACUTE ONLY): 32 min   Charges:   PT Evaluation $PT Eval Low Complexity: 1 Low PT Treatments $Gait Training: 8-22  mins $Therapeutic Exercise: 8-22 mins        Kreg Shropshire, DPT 10/20/2019, 6:14 PM

## 2019-10-20 NOTE — TOC Progression Note (Signed)
Transition of Care [facility]) - Progression Note    Patient Details  Name: Nicholas Monroe MRN: [id number] Date of Birth: May 29, 1941  Transition of Care [facility]) CM/SW Contact  Su Hilt, RN Phone Number: 10/20/2019, 3:58 PM  Clinical Narrative:     Requested Lovenox price       Expected Discharge Plan and Services                                                 Social Determinants of Health (SDOH) Interventions    Readmission Risk Interventions No flowsheet data found.

## 2019-10-20 NOTE — Progress Notes (Signed)
Pharmacy Antibiotic Note  Nicholas Monroe is a 78 y.o. male admitted on 10/20/2019 with surgical prophylaxis.  Pharmacy has been consulted for cefazolin dosing.  Plan: Will give cefazolin 2g IV x 1 for surgical prophylaxis     No data recorded.  Recent Labs  Lab 10/13/19 1014  WBC 5.9  CREATININE 1.07    Estimated Creatinine Clearance: 60.6 mL/min (by C-G formula based on SCr of 1.07 mg/dL).    No Known Allergies  Thank you for allowing pharmacy to be a part of this patient's care.  Tobie Lords, PharmD, BCPS Clinical Pharmacist 10/20/2019 6:19 AM

## 2019-10-20 NOTE — Anesthesia Preprocedure Evaluation (Signed)
Anesthesia Evaluation  Patient identified by MRN, date of birth, ID band Patient awake    Reviewed: Allergy & Precautions, H&P , NPO status , Patient's Chart, lab work & pertinent test results, reviewed documented beta blocker date and time   Airway Mallampati: II   Neck ROM: full    Dental  (+) Poor Dentition   Pulmonary neg pulmonary ROS,    Pulmonary exam normal        Cardiovascular Exercise Tolerance: Good hypertension, On Medications negative cardio ROS Normal cardiovascular exam Rhythm:regular Rate:Normal     Neuro/Psych  Neuromuscular disease negative psych ROS   GI/Hepatic negative GI ROS, Neg liver ROS,   Endo/Other  negative endocrine ROS  Renal/GU negative Renal ROS  negative genitourinary   Musculoskeletal   Abdominal   Peds  Hematology negative hematology ROS (+)   Anesthesia Other Findings Past Medical History: No date: Arthritis No date: Hyperlipidemia No date: Hypertension 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 Past Surgical History: No date: APPENDECTOMY 01/2009: COLONOSCOPY     Comment:  normal- Dr Bary Castilla 11/03/2018: COLONOSCOPY WITH PROPOFOL; N/A     Comment:  Procedure: COLONOSCOPY WITH PROPOFOL;  Surgeon: Robert Bellow, MD;  Location: [facility] ENDOSCOPY;  Service:               Endoscopy;  Laterality: N/A; No date: EYE SURGERY; Bilateral     Comment:  cataracts No date: HERNIA REPAIR 07/06/2015: TOTAL HIP ARTHROPLASTY; Right     Comment:  Procedure: TOTAL HIP ARTHROPLASTY;  Surgeon: Corky Mull, MD;  Location: [facility] ORS;  Service: Orthopedics;                Laterality: Right;   Reproductive/Obstetrics negative OB ROS                             Anesthesia Physical Anesthesia Plan  ASA: II  Anesthesia Plan: Spinal   Post-op Pain Management:    Induction:   PONV Risk Score and Plan:    Airway Management Planned:   Additional Equipment:   Intra-op Plan:   Post-operative Plan:   Informed Consent: I have reviewed the patients History and Physical, chart, labs and discussed the procedure including the risks, benefits and alternatives for the proposed anesthesia with the patient or authorized representative who has indicated his/her understanding and acceptance.     Dental Advisory Given  Plan Discussed with: CRNA  Anesthesia Plan Comments:         Anesthesia Quick Evaluation

## 2019-10-21 ENCOUNTER — Encounter: Payer: Self-pay | Admitting: Surgery

## 2019-10-21 LAB — CBC WITH DIFFERENTIAL/PLATELET
Abs Immature Granulocytes: 0.09 10*3/uL — ABNORMAL HIGH (ref 0.00–0.07)
Basophils Absolute: 0 10*3/uL (ref 0.0–0.1)
Basophils Relative: 0 %
Eosinophils Absolute: 0.3 10*3/uL (ref 0.0–0.5)
Eosinophils Relative: 3 %
HCT: 28 % — ABNORMAL LOW (ref 39.0–52.0)
Hemoglobin: 9.6 g/dL — ABNORMAL LOW (ref 13.0–17.0)
Immature Granulocytes: 1 %
Lymphocytes Relative: 6 %
Lymphs Abs: 0.5 10*3/uL — ABNORMAL LOW (ref 0.7–4.0)
MCH: 32.5 pg (ref 26.0–34.0)
MCHC: 34.3 g/dL (ref 30.0–36.0)
MCV: 94.9 fL (ref 80.0–100.0)
Monocytes Absolute: 0.6 10*3/uL (ref 0.1–1.0)
Monocytes Relative: 8 %
Neutro Abs: 6.8 10*3/uL (ref 1.7–7.7)
Neutrophils Relative %: 82 %
Platelets: 115 10*3/uL — ABNORMAL LOW (ref 150–400)
RBC: 2.95 MIL/uL — ABNORMAL LOW (ref 4.22–5.81)
RDW: 12.8 % (ref 11.5–15.5)
WBC: 8.3 10*3/uL (ref 4.0–10.5)
nRBC: 0 % (ref 0.0–0.2)

## 2019-10-21 LAB — BASIC METABOLIC PANEL
Anion gap: 5 (ref 5–15)
BUN: 17 mg/dL (ref 8–23)
CO2: 21 mmol/L — ABNORMAL LOW (ref 22–32)
Calcium: 8.1 mg/dL — ABNORMAL LOW (ref 8.9–10.3)
Chloride: 106 mmol/L (ref 98–111)
Creatinine, Ser: 1.09 mg/dL (ref 0.61–1.24)
GFR calc Af Amer: >60 mL/min (ref >60)
GFR calc non Af Amer: >60 mL/min (ref >60)
Glucose, Bld: 111 mg/dL — ABNORMAL HIGH (ref 70–99)
Potassium: 3.7 mmol/L (ref 3.5–5.1)
Sodium: 132 mmol/L — ABNORMAL LOW (ref 135–145)

## 2019-10-21 MED ORDER — OXYCODONE HCL 5 MG PO TABS: 5.0000 mg | ORAL_TABLET | ORAL | 0 refills | Status: DC | PRN

## 2019-10-21 MED ORDER — ENOXAPARIN SODIUM 40 MG/0.4ML ~~LOC~~ SOLN: 40.0000 mg | SUBCUTANEOUS | 0 refills | Status: DC

## 2019-10-21 MED ORDER — TRAMADOL HCL 50 MG PO TABS: 50.0000 mg | ORAL_TABLET | Freq: Four times a day (QID) | ORAL | 0 refills | Status: DC | PRN

## 2019-10-21 NOTE — Discharge Instructions (Signed)

## 2019-10-21 NOTE — Progress Notes (Signed)
Physical Therapy Treatment Patient Details Name: Nicholas Monroe MRN: [id number] DOB: 10-03-1941 Today's Date: 10/21/2019    History of Present Illness 78 y/o male s/p R TKA 10/20/19.    PT Comments    Pt did well with POD1 AM session, he was able to circumambulate the nurses station with slow but consistent cadence, 120 degrees of flexion, good strength with SLRs and other bed exercises and relatively low pain levels t/o the session.  He is eager to do all he can and is doing well.     Follow Up Recommendations  Home health PT     Equipment Recommendations  3in1 (PT)    Recommendations for Other Services       Precautions / Restrictions Precautions Precautions: Fall;Knee Restrictions LLE Weight Bearing: Weight bearing as tolerated    Mobility  Bed Mobility Overal bed mobility: Modified Independent             General bed mobility comments: less use of UEs to get LE toward EOB  Transfers Overall transfer level: Needs assistance Equipment used: Rolling walker (2 wheeled) Transfers: Sit to/from Stand Sit to Stand: Min guard         General transfer comment: cuing for sequencing and set up, able to rise  w/o phyiscal assist  Ambulation/Gait Ambulation/Gait assistance: Min guard Gait Distance (Feet): 200 Feet Assistive device: Rolling walker (2 wheeled)       General Gait Details: Pt more confident and consistent with cadence this session. Initially unable to maintain consistent forward momentum/cadence, but ultimately was able to increase speed and cadence and attain consistent walker Public house manager    Modified Rankin (Stroke Patients Only)       Balance Overall balance assessment: Needs assistance Sitting-balance support: No upper extremity supported Sitting balance-Leahy Scale: Good     Standing balance support: Bilateral upper extremity supported Standing balance-Leahy Scale: Good Standing balance  comment: Pt less reliant on the walker this session, but still needing UEs to maintain balance safely                            Cognition Arousal/Alertness: Awake/alert Behavior During Therapy: WFL for tasks assessed/performed Overall Cognitive Status: Within Functional Limits for tasks assessed                                        Exercises Total Joint Exercises Ankle Circles/Pumps: AROM;10 reps Quad Sets: Strengthening;10 reps Short Arc Quad: AROM;10 reps Heel Slides: AROM;10 reps Hip ABduction/ADduction: 10 reps;AROM;Strengthening Straight Leg Raises: AROM;10 reps Knee Flexion: PROM;5 reps Goniometric ROM: 0-121    General Comments        Pertinent Vitals/Pain Pain Assessment: 0-10 Pain Score: 2 (increases with activity, but never limiting)    Home Living                      Prior Function            PT Goals (current goals can now be found in the care plan section) Progress towards PT goals: Progressing toward goals    Frequency    BID      PT Plan Current plan remains appropriate    Co-evaluation  AM-PAC PT "6 Clicks" Mobility   Outcome Measure  Help needed turning from your back to your side while in a flat bed without using bedrails?: None Help needed moving from lying on your back to sitting on the side of a flat bed without using bedrails?: None Help needed moving to and from a bed to a chair (including a wheelchair)?: A Little Help needed standing up from a chair using your arms (e.g., wheelchair or bedside chair)?: A Little Help needed to walk in hospital room?: A Little Help needed climbing 3-5 steps with a railing? : A Little 6 Click Score: 20    End of Session Equipment Utilized During Treatment: Gait belt Activity Tolerance: Patient tolerated treatment well Patient left: with call bell/phone within reach;with chair alarm set Nurse Communication: Mobility status PT Visit  Diagnosis: Muscle weakness (generalized) (M62.81);Difficulty in walking, not elsewhere classified (R26.2);Pain Pain - Right/Left: Left Pain - part of body: Knee     Time: [id number] PT Time Calculation (min) (ACUTE ONLY): 41 min  Charges:  $Gait Training: 8-22 mins $Therapeutic Exercise: 8-22 mins                     Kreg Shropshire, DPT 10/21/2019, 1:10 PM

## 2019-10-21 NOTE — Progress Notes (Signed)
  Subjective: 1 Day Post-Op Procedure(s) (LRB): TOTAL KNEE ARTHROPLASTY (Left) Patient reports pain as mild.   Patient is well, and has had no acute complaints or problems PT and Care management to assist with d/c planning. Negative for chest pain and shortness of breath.  Working on Chiropodist when entering the room. Fever: no Gastrointestinal:Negative for nausea and vomiting  Objective: Vital signs in last 24 hours: Temp:  [96.9 F (36.1 C)-98.7 F (37.1 C)] 98.7 F (37.1 C) (11/06 0344) Pulse Rate:  [41-64] 51 (11/06 0344) Resp:  [13-18] 17 (11/06 0344) BP: (101-126)/(55-80) 110/57 (11/06 0344) SpO2:  [93 %-100 %] 93 % (11/06 0344) FiO2 (%):  [21 %] 21 % (11/05 1547)  Intake/Output from previous day:  Intake/Output Summary (Last 24 hours) at 10/21/2019 0749 Last data filed at 10/21/2019 0700 Gross per 24 hour  Intake 2516.67 ml  Output 1410 ml  Net 1106.67 ml    Intake/Output this shift: No intake/output data recorded.  Labs: Recent Labs    10/21/19 0525  HGB 9.6*   Recent Labs    10/21/19 0525  WBC 8.3  RBC 2.95*  HCT 28.0*  PLT 115*   Recent Labs    10/21/19 0525  NA 132*  K 3.7  CL 106  CO2 21*  BUN 17  CREATININE 1.09  GLUCOSE 111*  CALCIUM 8.1*   No results for input(s): LABPT, INR in the last 72 hours.   EXAM General - Patient is Alert, Appropriate and Oriented Extremity - Neurovascular intact Sensation intact distally Intact pulses distally Dorsiflexion/Plantar flexion intact Incision: dressing C/D/I No cellulitis present Dressing/Incision - clean, dry, no drainage Motor Function - intact, moving foot and toes well on exam.  Normal bowel sounds, mild distention to the abdomen.  Past Medical History:  Diagnosis Date  . Arthritis   . Hyperlipidemia   . Hypertension   . Ocular myasthenia gravis ([place])    double vision    Assessment/Plan: 1 Day Post-Op Procedure(s) (LRB): TOTAL KNEE ARTHROPLASTY (Left) Active  Problems:   Status post total knee replacement using cement, left  Estimated body mass index is 25.4 kg/m as calculated from the following:   Height as of 10/13/19: 5\' 11"  (1.803 m).   Weight as of 10/13/19: 82.6 kg. Advance diet Up with therapy D/C IV fluids when tolerating po intake.  Labs reviewed this AM.  Hg 9.6, Platelets 115. CBC and BMP ordered for tomorrow morning. Continue with PT today. Begin working on BM. Plan for possible d/c tomorrow.  DVT Prophylaxis - Lovenox, Foot Pumps and TED hose Weight-Bearing as tolerated to left leg  J. Cameron Proud, PA-C [facility] Orthopaedic Surgery 10/21/2019, 7:49 AM

## 2019-10-21 NOTE — Anesthesia Postprocedure Evaluation (Signed)
Anesthesia Post Note  Patient: Taiquan Fronda  Procedure(s) Performed: TOTAL KNEE ARTHROPLASTY (Left Knee)  Patient location during evaluation: Nursing Unit Anesthesia Type: Spinal Level of consciousness: oriented and awake and alert Pain management: pain level controlled Vital Signs Assessment: post-procedure vital signs reviewed and stable Respiratory status: spontaneous breathing, respiratory function stable and patient connected to nasal cannula oxygen Cardiovascular status: blood pressure returned to baseline and stable Postop Assessment: no headache, no backache and no apparent nausea or vomiting Anesthetic complications: no     Last Vitals:  Vitals:   10/21/19 0344 10/21/19 0803  BP: (!) 110/57 (!) 109/56  Pulse: (!) 51 62  Resp: 17 16  Temp: 37.1 C 36.8 C  SpO2: 93% 92%    Last Pain:  Vitals:   10/21/19 0344  TempSrc: Oral  PainSc:                  Estill Batten

## 2019-10-21 NOTE — TOC Benefit Eligibility Note (Signed)
Transition of Care [facility]) Benefit Eligibility Note    Patient Details  Name: Keiyon Plack MRN: [id number] Date of Birth: 03/01/1941   Medication/Dose: Enoxaparin 61m once daily for 14 days  Covered?: Yes  Tier: Other(Tier 1)  Prescription Coverage Preferred Pharmacy: [facility]  Spoke with Person/Company/Phone Number:: YFara Boroswith [employer] at [phone] Co-Pay: $10.00 estimated copay  Prior Approval: No  Deductible: [address]Phone Number: [phone] [phone] 462911/05/2019, 8:55 AM

## 2019-10-21 NOTE — Progress Notes (Signed)
Physical Therapy Treatment Patient Details Name: Nicholas Monroe MRN: [id number] DOB: Sep 09, 1941 Today's Date: 10/21/2019    History of Present Illness 78 y/o male s/p R TKA 10/20/19.    PT Comments    Pt continues to be very motivated to work with PT and get back home.  He is less energetic this afternoon (sites not sleeping much the last few nights) and reports that he had 1 small bout of vomiting.  He continues to do well with all aspects of post-op progress (250 ft amb, steps, good ROM, etc) but was weaker with quad sets and strength during SLRs.    Follow Up Recommendations  Home health PT     Equipment Recommendations  3in1 (PT)    Recommendations for Other Services       Precautions / Restrictions Precautions Precautions: Fall;Knee Restrictions Weight Bearing Restrictions: Yes LLE Weight Bearing: Weight bearing as tolerated    Mobility  Bed Mobility Overal bed mobility: Modified Independent                Transfers Overall transfer level: Modified independent Equipment used: Rolling walker (2 wheeled) Transfers: Sit to/from Stand Sit to Stand: Min guard         General transfer comment: pt continues to need reminders for set up and hand placement  Ambulation/Gait Ambulation/Gait assistance: Min guard Gait Distance (Feet): 250 Feet Assistive device: Rolling walker (2 wheeled)       General Gait Details: Pt continues to have slow initial cadence and mobility and L hip creeking during ambulation but was ultimately able to attain consistent and appropriate cadence.  Pt with O2 generally staying in the low 90s (at times high 80s at times >95%) with some fatigue but no SOB.     Stairs Stairs: Yes Stairs assistance: Supervision Stair Management: One rail Left;Sideways Number of Stairs: 4 General stair comments: Pt was able to negotiate up/down steps without phyiscal assist and only minimal cuing.     Wheelchair Mobility    Modified Rankin  (Stroke Patients Only)       Balance Overall balance assessment: Modified Independent   Sitting balance-Leahy Scale: Good       Standing balance-Leahy Scale: Good                              Cognition Arousal/Alertness: Awake/alert Behavior During Therapy: WFL for tasks assessed/performed Overall Cognitive Status: Within Functional Limits for tasks assessed                                 General Comments: pt continues to have minimal low grade confusion at times      Exercises Total Joint Exercises Ankle Circles/Pumps: Strengthening;15 reps Quad Sets: Strengthening;15 reps Short Arc Quad: Strengthening;15 reps Heel Slides: Strengthening;10 reps Hip ABduction/ADduction: Strengthening;15 reps Straight Leg Raises: AROM;10 reps(pt with less confident quad set/lift vs AM session) Knee Flexion: AROM;10 reps Goniometric ROM: continues to easily achieve  ~120 deg    General Comments        Pertinent Vitals/Pain Pain Assessment: 0-10 Pain Score: 4  Pain Location: L knee(reports that his pain was much higher earlier this afternoon)    Home Living                      Prior Function  PT Goals (current goals can now be found in the care plan section) Progress towards PT goals: Progressing toward goals    Frequency    BID      PT Plan Current plan remains appropriate    Co-evaluation              AM-PAC PT "6 Clicks" Mobility   Outcome Measure  Help needed turning from your back to your side while in a flat bed without using bedrails?: None Help needed moving from lying on your back to sitting on the side of a flat bed without using bedrails?: None Help needed moving to and from a bed to a chair (including a wheelchair)?: A Little Help needed standing up from a chair using your arms (e.g., wheelchair or bedside chair)?: A Little Help needed to walk in hospital room?: A Little Help needed climbing 3-5 steps  with a railing? : A Little 6 Click Score: 20    End of Session Equipment Utilized During Treatment: Gait belt Activity Tolerance: Patient tolerated treatment well Patient left: with call bell/phone within reach;with bed alarm set;with family/visitor present Nurse Communication: Mobility status PT Visit Diagnosis: Muscle weakness (generalized) (M62.81);Difficulty in walking, not elsewhere classified (R26.2);Pain Pain - Right/Left: Left Pain - part of body: Knee     Time: [id number] PT Time Calculation (min) (ACUTE ONLY): 47 min  Charges:  $Gait Training: 8-22 mins $Therapeutic Exercise: 23-37 mins                     Kreg Shropshire, DPT 10/21/2019, 4:02 PM

## 2019-10-21 NOTE — TOC Initial Note (Signed)
Transition of Care [facility]) - Initial/Assessment Note    Patient Details  Name: Nicholas Monroe MRN: [id number] Date of Birth: [birth year]  Transition of Care [facility]) CM/SW Contact:    Su Hilt, RN Phone Number: 10/21/2019, 4:14 PM  Clinical Narrative:                 Met with the patient to discuss DC planning and needs He lives at home with his wife and has a RW at home, does not feel that he needs a BSC His wife will provide Transportation He is familiar with Lovenox and I provided the price of $10 He is [facility] with [facility] and I [facility] At the patient's request I did call his wife and left a VM letting her know of [facility] being set up No further needs at this time Expected Discharge Plan: [place] Barriers to Discharge: Continued Medical Work up   Patient Goals and CMS Choice Patient states their goals for this hospitalization and ongoing recovery are:: go home CMS Medicare.gov Compare Post Acute Care list provided to:: Patient Choice offered to / list presented to : Patient  Expected Discharge Plan and Services Expected Discharge Plan: [place]   Discharge Planning Services: CM Consult Post Acute Care Choice: [place] arrangements for the past 2 months: Single Family Home                 DME Arranged: N/A         HH Arranged: PT HH Agency: [facility] [employer]) Date [place]: 10/21/19 Time [place]: [phone] Representative spoke with at [place]: Joen Laura  Prior Living Arrangements/Services Living arrangements for the past 2 months: [place] Lives with:: Spouse Patient language and need for interpreter reviewed:: Yes Do you feel safe going back to the place where you live?: Yes      Need for Family Participation in Patient Care: No (Comment) Care giver support system in place?: Yes (comment) Current home  services: DME(RW) Criminal Activity/Legal Involvement Pertinent to Current Situation/Hospitalization: No - Comment as needed  Activities of Daily Living Home Assistive Devices/Equipment: Cane (specify quad or straight), Walker (specify type) ADL Screening (condition at time of admission) Patient's cognitive ability adequate to safely complete daily activities?: Yes Is the patient deaf or have difficulty hearing?: No Does the patient have difficulty seeing, even when wearing glasses/contacts?: No Does the patient have difficulty concentrating, remembering, or making decisions?: No Patient able to express need for assistance with ADLs?: Yes Does the patient have difficulty dressing or bathing?: No Independently performs ADLs?: Yes (appropriate for developmental age) Does the patient have difficulty walking or climbing stairs?: Yes Weakness of Legs: Left Weakness of Arms/Hands: None  Permission Sought/Granted   Permission granted to share information with : Yes, Verbal Permission Granted              Emotional Assessment Appearance:: Appears stated age Attitude/Demeanor/Rapport: Engaged Affect (typically observed): Appropriate Orientation: : Oriented to Self, Oriented to Place, Oriented to  Time, Oriented to Situation Alcohol / Substance Use: Not Applicable Psych Involvement: No (comment)  Admission diagnosis:  PRIMARY OSTEOARTHRITIS OF LEFT KNEE Patient Active Problem List   Diagnosis Date Noted  . Status post total knee replacement using cement, left 10/20/2019  . Encounter for screening colonoscopy 10/28/2018  . Nocturia more than twice per night 12/30/2017  . Hyperlipidemia  05/15/2016  . Essential hypertension 05/15/2016  . Closed right hip fracture ([place]) 07/05/2015   PCP:  Juline Patch, MD Pharmacy:   [facility], [address] [place] [address] Phone: [phone] Fax: [phone]     Social Determinants of Health (SDOH)  Interventions    Readmission Risk Interventions No flowsheet data found.

## 2019-10-21 NOTE — Discharge Summary (Signed)
Physician Discharge Summary  Patient ID: Nicholas Monroe MRN: [id number] DOB/AGE: 1941-08-31 78 y.o.  Admit date: 10/20/2019 Discharge date: 10/22/19  Admission Diagnoses:  PRIMARY OSTEOARTHRITIS OF LEFT KNEE  Discharge Diagnoses: Patient Active Problem List   Diagnosis Date Noted  . Status post total knee replacement using cement, left 10/20/2019  . Encounter for screening colonoscopy 10/28/2018  . Nocturia more than twice per night 12/30/2017  . Hyperlipidemia 05/15/2016  . Essential hypertension 05/15/2016  . Closed right hip fracture ([place]) 07/05/2015    Past Medical History:  Diagnosis Date  . Arthritis   . Hyperlipidemia   . Hypertension   . 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   Consultants (if any):   Discharged Condition: Improved  Hospital Course: Verlyn Saurman is an 78 y.o. male who was admitted 10/20/2019 with a diagnosis of primary osteoarthritis of the left knee and went to the operating room on 10/20/2019 and underwent the above named procedures.    Surgeries: Procedure(s): TOTAL KNEE ARTHROPLASTY on 10/20/2019 Patient tolerated the surgery well. Taken to PACU where she was stabilized and then transferred to the orthopedic floor.  Started on Lovenox 40mg  q 24 hrs. Foot pumps applied bilaterally at 80 mm. Heels elevated on bed with rolled towels. No evidence of DVT. Negative [place]. Physical therapy started on day #1 for gait training and transfer. OT started day #1 for ADL and assisted devices.  Patient's IV was removed on POD2.  Foley removed on POD1.  Implants: Left TKA using all-cemented [employer] Vanguard system with a 72.5 mm PCR femur, a 79 mm tibial tray with a 12 mm anterior stabilized E-poly insert, and a 37 x 10 mm all-poly 3-pegged domed patella.  He was given perioperative antibiotics:  Anti-infectives (From admission, onward)   Start     Dose/Rate Route Frequency Ordered Stop   10/20/19 1400  ceFAZolin  (ANCEF) IVPB 2g/100 mL premix     2 g 200 mL/hr over 30 Minutes Intravenous Every 6 hours 10/20/19 1229 10/21/19 0759   10/20/19 0630  ceFAZolin (ANCEF) IVPB 2g/100 mL premix     2 g 200 mL/hr over 30 Minutes Intravenous  Once 10/20/19 0619 10/20/19 0755   10/20/19 0608  ceFAZolin (ANCEF) 2-4 GM/100ML-% IVPB    Note to Pharmacy: Trudie Reed   : cabinet override      10/20/19 [id number] 10/20/19 0755    .  He was given sequential compression devices, early ambulation, and Lovenox for DVT prophylaxis.  He benefited maximally from the hospital stay and there were no complications.    Recent vital signs:  Vitals:   10/21/19 0028 10/21/19 0344  BP: (!) 101/55 (!) 110/57  Pulse: 61 (!) 51  Resp: 17 17  Temp: 98.4 F (36.9 C) 98.7 F (37.1 C)  SpO2: 95% 93%    Recent laboratory studies:  Lab Results  Component Value Date   HGB 9.6 (L) 10/21/2019   HGB 12.4 (L) 10/13/2019   HGB 13.1 10/05/2018   Lab Results  Component Value Date   WBC 8.3 10/21/2019   PLT 115 (L) 10/21/2019   No results found for: INR Lab Results  Component Value Date   NA 132 (L) 10/21/2019   K 3.7 10/21/2019   CL 106 10/21/2019   CO2 21 (L) 10/21/2019   BUN 17 10/21/2019   CREATININE 1.09 10/21/2019   GLUCOSE 111 (H) 10/21/2019    Discharge Medications:   Allergies as of  10/21/2019   No Known Allergies     Medication List    STOP taking these medications   aspirin EC 81 MG tablet   meloxicam 15 MG tablet Commonly known as: MOBIC     TAKE these medications   acetaminophen 500 MG tablet Commonly known as: TYLENOL Take 500 mg by mouth every 8 (eight) hours as needed for mild pain. Hip pain   atenolol 50 MG tablet Commonly known as: TENORMIN Take 1 tablet (50 mg total) by mouth 2 (two) times daily.   CENTRUM SILVER PO Take 1 tablet by mouth daily.   enoxaparin 40 MG/0.4ML injection Commonly known as: LOVENOX Inject 0.4 mLs (40 mg total) into the skin daily.   fenofibrate 145 MG  tablet Commonly known as: TRICOR Take 0.5 tablets (72.5 mg total) by mouth at bedtime.   Fish Oil 1000 MG Caps Take by mouth.   folic acid 1 MG tablet Commonly known as: FOLVITE Take 1 mg by mouth daily. Takes Sunday-Friday-Pt does not take on Saturday.- Dr Shah   lisinopril 10 MG tablet Commonly known as: ZESTRIL Take 1 tablet (10 mg total) by mouth daily. What changed: when to take this   Lovaza 1 g capsule Generic drug: omega-3 acid ethyl esters 2 capsules bid What changed:   how much to take  how to take this  when to take this   Melatonin 3 MG Tabs Take 1 tablet by mouth at bedtime as needed (sleep).   methotrexate 2.5 MG tablet Commonly known as: RHEUMATREX Take 15 mg by mouth once a week. Pt takes on Saturday. Dr Shah   oxyCODONE 5 MG immediate release tablet Commonly known as: Oxy IR/ROXICODONE Take 1-2 tablets (5-10 mg total) by mouth every 4 (four) hours as needed for moderate pain.   traMADol 50 MG tablet Commonly known as: ULTRAM Take 1-2 tablets (50-100 mg total) by mouth every 6 (six) hours as needed for moderate pain.            Durable Medical Equipment  (From admission, onward)         Start     Ordered   10/20/19 1230  DME Bedside commode  Once    Question:  Patient needs a bedside commode to treat with the following condition  Answer:  Status post total knee replacement using cement, left   10/20/19 1229   10/20/19 1230  DME 3 n 1  Once     11 /05/20 1229   10/20/19 1230  DME Walker rolling  Once    Question:  Patient needs a walker to treat with the following condition  Answer:  Status post total knee replacement using cement, left   10/20/19 1229         Diagnostic Studies: Dg Knee Left Port  Result Date: 10/20/2019 CLINICAL DATA:  78 year old male with a history of knee replacement EXAM: PORTABLE LEFT KNEE - 1-2 VIEW COMPARISON:  None. FINDINGS: Surgical changes of left knee arthroplasty with surgical clips in the overlying soft  tissues, gas and swelling at the surgical bed. No fracture or complicating features. IMPRESSION: Early surgical changes of left knee arthroplasty with no complicating features Electronically Signed   By: Corrie Mckusick D.O.   On: 10/20/2019 11:05   Disposition: Plan for possible d/c home on 10/22/19 pending progress with therapy today.  Follow-up Information    Lattie Corns, PA-C Follow up in 14 day(s).   Specialty: Physician Assistant Why: Staple removal. Contact information: [place]  [place] [address] [phone]          Signed: Judson Roch PA-C 10/21/2019, 7:53 AM

## 2019-10-22 LAB — CBC
HCT: 30 % — ABNORMAL LOW (ref 39.0–52.0)
Hemoglobin: 10.2 g/dL — ABNORMAL LOW (ref 13.0–17.0)
MCH: 32.5 pg (ref 26.0–34.0)
MCHC: 34 g/dL (ref 30.0–36.0)
MCV: 95.5 fL (ref 80.0–100.0)
Platelets: 119 10*3/uL — ABNORMAL LOW (ref 150–400)
RBC: 3.14 MIL/uL — ABNORMAL LOW (ref 4.22–5.81)
RDW: 12.9 % (ref 11.5–15.5)
WBC: 6.9 10*3/uL (ref 4.0–10.5)
nRBC: 0 % (ref 0.0–0.2)

## 2019-10-22 LAB — BASIC METABOLIC PANEL
Anion gap: 9 (ref 5–15)
BUN: 12 mg/dL (ref 8–23)
CO2: 22 mmol/L (ref 22–32)
Calcium: 8.8 mg/dL — ABNORMAL LOW (ref 8.9–10.3)
Chloride: 107 mmol/L (ref 98–111)
Creatinine, Ser: 0.98 mg/dL (ref 0.61–1.24)
GFR calc Af Amer: >60 mL/min (ref >60)
GFR calc non Af Amer: >60 mL/min (ref >60)
Glucose, Bld: 107 mg/dL — ABNORMAL HIGH (ref 70–99)
Potassium: 3.8 mmol/L (ref 3.5–5.1)
Sodium: 138 mmol/L (ref 135–145)

## 2019-10-22 NOTE — Progress Notes (Signed)
Physical Therapy Treatment Patient Details Name: Nicholas Monroe MRN: [id number] DOB: 06/23/1941 Today's Date: 10/22/2019    History of Present Illness 78 y/o male s/p R TKA 10/20/19.    PT Comments    Pt in bed upon arrival agreeable to PT. Pt reported mild pain throughout session and feeling better than yesterday. Pt reported being tired due to lack of quality sleep last couple days. Pt reviewed HEP requiring min cuing for correct performance. Pt reported increased stiffness this morning. Upon arrival pt knees bent in bed which could be contributing to increased stiffness. L knee ROM -5-100 degrees. Pt ambulated with improved gait pattern with no unsteadiness, buckling or LOB. Pt reported feeling comfortable and safe with stair navigation and did not need to practice again. Pt and wife educated on car transfer, wifes role with assisting with stair navigation, polar care application and resting with towel roll under heel.  Pt presents with decreased strength, ROM, balance and activity tolerance consistent with recent TKA. Pt will benefit from continued skilled PT to further improve deficits in order to improve independence and allow for return to PLOF. Home health PT appropriate following hospital discharge.   Follow Up Recommendations  Home health PT     Equipment Recommendations  3in1 (PT)    Recommendations for Other Services       Precautions / Restrictions Precautions Precautions: Fall;Knee Restrictions Weight Bearing Restrictions: Yes LLE Weight Bearing: Weight bearing as tolerated    Mobility  Bed Mobility Overal bed mobility: Needs Assistance Bed Mobility: Supine to Sit     Supine to sit: Supervision     General bed mobility comments: pt safely able to get EOB, mild use of UEs and RLE to advance LLE  Transfers Overall transfer level: Needs assistance Equipment used: Rolling walker (2 wheeled) Transfers: Sit to/from Stand Sit to Stand: Supervision          General transfer comment: good carryover of hand/foot placement, safe and steady with STS, increased trunk flexion during transfer but safe  Ambulation/Gait Ambulation/Gait assistance: Min guard Gait Distance (Feet): 200 Feet Assistive device: Rolling walker (2 wheeled) Gait Pattern/deviations: Step-to pattern;Decreased stride length;Decreased weight shift to left;Decreased stance time - left;Trunk flexed;Antalgic Gait velocity: decreased   General Gait Details: pt ambulated around nurses station with mildly antaglic gait pattern, pt ambulated with min/mod reliance of UE from RW, pt had no unsteadiness, buckling or LOB, pt required min cuing for keeping RW closer   Stairs             Wheelchair Mobility    Modified Rankin (Stroke Patients Only)       Balance Overall balance assessment: Mild deficits observed, not formally tested   Sitting balance-Leahy Scale: Good Sitting balance - Comments: no issues with static sitting balance   Standing balance support: Bilateral upper extremity supported;During functional activity Standing balance-Leahy Scale: Good Standing balance comment: Pt less reliant on the walker this session, but still needing UEs to maintain balance safely                            Cognition Arousal/Alertness: Awake/alert Behavior During Therapy: WFL for tasks assessed/performed Overall Cognitive Status: Within Functional Limits for tasks assessed                                 General Comments: pt continues to have minimal low grade confusion  at times, dec short term memory      Exercises Total Joint Exercises Ankle Circles/Pumps: Strengthening;15 reps Quad Sets: Strengthening;15 reps Hip ABduction/ADduction: Strengthening;15 reps;AAROM Straight Leg Raises: 10 reps;AAROM;Strengthening Long Arc Quad: AROM;10 reps;Left Knee Flexion: AROM;Left;10 reps Goniometric ROM: L knee ROM -5-100, pt reporting increased stiffness  this morning    General Comments General comments (skin integrity, edema, etc.): ice applied start/end of session      Pertinent Vitals/Pain Pain Assessment: 0-10 Pain Score: 2  Pain Location: L knee during ambulation Pain Descriptors / Indicators: Sore Pain Intervention(s): Limited activity within patient's tolerance;Monitored during session;Repositioned;Ice applied    Home Living                      Prior Function            PT Goals (current goals can now be found in the care plan section) Progress towards PT goals: Progressing toward goals    Frequency    BID      PT Plan Current plan remains appropriate    Co-evaluation              AM-PAC PT "6 Clicks" Mobility   Outcome Measure  Help needed turning from your back to your side while in a flat bed without using bedrails?: None Help needed moving from lying on your back to sitting on the side of a flat bed without using bedrails?: None Help needed moving to and from a bed to a chair (including a wheelchair)?: A Little Help needed standing up from a chair using your arms (e.g., wheelchair or bedside chair)?: A Little Help needed to walk in hospital room?: A Little Help needed climbing 3-5 steps with a railing? : A Little 6 Click Score: 20    End of Session Equipment Utilized During Treatment: Gait belt Activity Tolerance: Patient tolerated treatment well Patient left: in chair;with call bell/phone within reach;with family/visitor present Nurse Communication: Mobility status PT Visit Diagnosis: Muscle weakness (generalized) (M62.81);Difficulty in walking, not elsewhere classified (R26.2);Pain Pain - Right/Left: Left Pain - part of body: Knee     Time: [id number] PT Time Calculation (min) (ACUTE ONLY): 39 min  Charges:  $Therapeutic Exercise: 23-37 mins                    Kaius Daino PT, DPT 10:34 AM,10/22/19 [id number]

## 2019-10-22 NOTE — TOC Progression Note (Signed)
Transition of Care [facility]) - Progression Note    Patient Details  Name: Nicholas Monroe MRN: [id number] Date of Birth: 11/21/1941  Transition of Care [facility]) CM/SW Contact  Deion Swift, [place], [place] Phone Number: 10/22/2019, 9:42 AM  Clinical Narrative:   Patient to discharge home today. Patient's wife will provide transport. [place] PT set up through [place]. Helene Kelp from [facility] informed of patient's discharge.  No further needs at this time.   Nyasiah Moffet, LCSW Clinical Social Work [phone]    Expected Discharge Plan: [place] Barriers to Discharge: Continued Medical Work up  Expected Discharge Plan and Services Expected Discharge Plan: [place]   Discharge Planning Services: CM Consult Post Acute Care Choice: [place] arrangements for the past 2 months: Single Family Home Expected Discharge Date: 10/22/19               DME Arranged: N/A         HH Arranged: PT [place]: [facility] [employer] Date [place]: 10/21/19 Time [place]: [phone] Representative spoke with at [place]: [place] ([place]) Interventions    Readmission Risk Interventions No flowsheet data found.

## 2019-10-22 NOTE — Progress Notes (Signed)
  Subjective: 2 Days Post-Op Procedure(s) (LRB): TOTAL KNEE ARTHROPLASTY (Left) Patient reports pain as mild.   Patient is well, and has had no acute complaints or problems PT and Care management to assist with d/c planning. Negative for chest pain and shortness of breath.  Working on Chiropodist when entering the room. Fever: no Gastrointestinal:Negative for nausea and vomiting  Objective: Vital signs in last 24 hours: Temp:  [97.7 F (36.5 C)-98.2 F (36.8 C)] 98.1 F (36.7 C) (11/06 2318) Pulse Rate:  [56-66] 58 (11/06 2318) Resp:  [16-17] 17 (11/06 2318) BP: (103-120)/(55-75) 111/55 (11/06 2318) SpO2:  [92 %-96 %] 93 % (11/06 2318) Weight:  [75.6 kg] 75.6 kg (11/06 2143)  Intake/Output from previous day:  Intake/Output Summary (Last 24 hours) at 10/22/2019 0633 Last data filed at 10/22/2019 0600 Gross per 24 hour  Intake 1435 ml  Output 3475 ml  Net -2040 ml    Intake/Output this shift: Total I/O In: 1195 [I.V.:1195] Out: 3100 [Urine:3100]  Labs: Recent Labs    10/21/19 0525  HGB 9.6*   Recent Labs    10/21/19 0525  WBC 8.3  RBC 2.95*  HCT 28.0*  PLT 115*   Recent Labs    10/21/19 0525  NA 132*  K 3.7  CL 106  CO2 21*  BUN 17  CREATININE 1.09  GLUCOSE 111*  CALCIUM 8.1*   No results for input(s): LABPT, INR in the last 72 hours.   EXAM General - Patient is Alert, Appropriate and Oriented Extremity - Neurovascular intact Sensation intact distally Intact pulses distally Dorsiflexion/Plantar flexion intact Incision: dressing C/D/I No cellulitis present Dressing/Incision - clean, dry, no drainage Motor Function - intact, moving foot and toes well on exam.  Normal bowel sounds, mild distention to the abdomen.  Past Medical History:  Diagnosis Date  . Arthritis   . Hyperlipidemia   . Hypertension   . 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   Weight as of this encounter: 75.6 kg. Advance diet Up with therapy D/C IV fluids when tolerating po intake.  Labs reviewed this AM.  Hg 9.6, Platelets 115. Continue with PT today. Continue working on BM. Plan for possible d/c today.  DVT Prophylaxis - Lovenox, Foot Pumps and TED hose Weight-Bearing as tolerated to left leg  Reche Dixon PA-C [facility] Orthopaedic Surgery 10/22/2019, 6:33 AM

## 2019-12-13 ENCOUNTER — Other Ambulatory Visit: Payer: Self-pay | Admitting: Family Medicine

## 2019-12-13 DIAGNOSIS — E782 Mixed hyperlipidemia: Secondary | ICD-10-CM

## 2019-12-15 ENCOUNTER — Other Ambulatory Visit: Payer: Self-pay

## 2019-12-15 DIAGNOSIS — E782 Mixed hyperlipidemia: Secondary | ICD-10-CM

## 2019-12-15 MED ORDER — OMEGA-3-ACID ETHYL ESTERS 1 G PO CAPS: ORAL_CAPSULE | ORAL | 0 refills | Status: DC

## 2019-12-19 DIAGNOSIS — M25662 Stiffness of left knee, not elsewhere classified: Secondary | ICD-10-CM | POA: Diagnosis not present

## 2019-12-19 DIAGNOSIS — M25562 Pain in left knee: Secondary | ICD-10-CM | POA: Diagnosis not present

## 2019-12-19 DIAGNOSIS — Z96652 Presence of left artificial knee joint: Secondary | ICD-10-CM | POA: Diagnosis not present

## 2019-12-19 DIAGNOSIS — M6281 Muscle weakness (generalized): Secondary | ICD-10-CM | POA: Diagnosis not present

## 2019-12-21 DIAGNOSIS — M6281 Muscle weakness (generalized): Secondary | ICD-10-CM | POA: Diagnosis not present

## 2019-12-21 DIAGNOSIS — M25662 Stiffness of left knee, not elsewhere classified: Secondary | ICD-10-CM | POA: Diagnosis not present

## 2019-12-21 DIAGNOSIS — Z96652 Presence of left artificial knee joint: Secondary | ICD-10-CM | POA: Diagnosis not present

## 2019-12-21 DIAGNOSIS — M25562 Pain in left knee: Secondary | ICD-10-CM | POA: Diagnosis not present

## 2019-12-26 DIAGNOSIS — M25562 Pain in left knee: Secondary | ICD-10-CM | POA: Diagnosis not present

## 2019-12-26 DIAGNOSIS — M6281 Muscle weakness (generalized): Secondary | ICD-10-CM | POA: Diagnosis not present

## 2019-12-26 DIAGNOSIS — Z96652 Presence of left artificial knee joint: Secondary | ICD-10-CM | POA: Diagnosis not present

## 2019-12-26 DIAGNOSIS — M25662 Stiffness of left knee, not elsewhere classified: Secondary | ICD-10-CM | POA: Diagnosis not present

## 2019-12-28 DIAGNOSIS — M25662 Stiffness of left knee, not elsewhere classified: Secondary | ICD-10-CM | POA: Diagnosis not present

## 2019-12-28 DIAGNOSIS — Z96652 Presence of left artificial knee joint: Secondary | ICD-10-CM | POA: Diagnosis not present

## 2019-12-28 DIAGNOSIS — M6281 Muscle weakness (generalized): Secondary | ICD-10-CM | POA: Diagnosis not present

## 2019-12-28 DIAGNOSIS — M25562 Pain in left knee: Secondary | ICD-10-CM | POA: Diagnosis not present

## 2019-12-29 ENCOUNTER — Encounter: Payer: Self-pay | Admitting: Family Medicine

## 2019-12-29 ENCOUNTER — Other Ambulatory Visit: Payer: Self-pay

## 2019-12-29 ENCOUNTER — Ambulatory Visit (INDEPENDENT_AMBULATORY_CARE_PROVIDER_SITE_OTHER): Payer: Medicare PPO | Admitting: Family Medicine

## 2019-12-29 VITALS — BP 120/68 | HR 72 | Ht 70.0 in | Wt 181.0 lb

## 2019-12-29 DIAGNOSIS — Z23 Encounter for immunization: Secondary | ICD-10-CM | POA: Diagnosis not present

## 2019-12-29 DIAGNOSIS — E782 Mixed hyperlipidemia: Secondary | ICD-10-CM | POA: Diagnosis not present

## 2019-12-29 DIAGNOSIS — R351 Nocturia: Secondary | ICD-10-CM | POA: Diagnosis not present

## 2019-12-29 DIAGNOSIS — N401 Enlarged prostate with lower urinary tract symptoms: Secondary | ICD-10-CM

## 2019-12-29 DIAGNOSIS — I10 Essential (primary) hypertension: Secondary | ICD-10-CM

## 2019-12-29 MED ORDER — TAMSULOSIN HCL 0.4 MG PO CAPS: 0.4000 mg | ORAL_CAPSULE | Freq: Every day | ORAL | 3 refills | Status: DC

## 2019-12-29 MED ORDER — FENOFIBRATE 145 MG PO TABS: 72.5000 mg | ORAL_TABLET | Freq: Every day | ORAL | 1 refills | Status: DC

## 2019-12-29 MED ORDER — LISINOPRIL 10 MG PO TABS: 10.0000 mg | ORAL_TABLET | Freq: Every day | ORAL | 1 refills | Status: DC

## 2019-12-29 MED ORDER — ATENOLOL 50 MG PO TABS: 50.0000 mg | ORAL_TABLET | Freq: Two times a day (BID) | ORAL | 1 refills | Status: DC

## 2019-12-29 NOTE — Progress Notes (Signed)
Date:  12/29/2019   Name:  Nicholas Monroe   DOB:  1941-03-23   MRN:  [id number]   Chief Complaint: Hypertension, Hyperlipidemia, and Flu Vaccine  Hypertension This is a chronic problem. The current episode started more than 1 year ago. The problem has been gradually improving since onset. The problem is controlled. Pertinent negatives include no anxiety, blurred vision, chest pain, headaches, malaise/fatigue, neck pain, orthopnea, palpitations, peripheral edema, PND, shortness of breath or sweats. There are no associated agents to hypertension. Risk factors for coronary artery disease include dyslipidemia and male gender. Past treatments include ACE inhibitors and beta blockers. The current treatment provides moderate improvement. There are no compliance problems.  There is no history of angina, kidney disease, CAD/MI, CVA, heart failure, left ventricular hypertrophy, PVD or retinopathy. There is no history of chronic renal disease, a hypertension causing med or renovascular disease.  Hyperlipidemia This is a chronic problem. The current episode started more than 1 year ago. The problem is controlled. Recent lipid tests were reviewed and are normal. He has no history of chronic renal disease, diabetes, hypothyroidism, liver disease, obesity or nephrotic syndrome. Pertinent negatives include no chest pain, focal sensory loss, focal weakness, leg pain, myalgias or shortness of breath. Current antihyperlipidemic treatment includes fibric acid derivatives (omega 3). The current treatment provides moderate improvement of lipids. There are no compliance problems.  Risk factors for coronary artery disease include hypertension, dyslipidemia and male sex.  Benign Prostatic Hypertrophy This is a chronic problem. The current episode started more than 1 year ago. The problem has been gradually worsening since onset. Irritative symptoms include frequency and nocturia. Irritative symptoms do not include urgency.  Obstructive symptoms include dribbling and a weak stream. Obstructive symptoms do not include incomplete emptying or an intermittent stream. Pertinent negatives include no chills, dysuria, hematuria, hesitancy, nausea or vomiting.    Lab Results  Component Value Date   CREATININE 0.98 10/22/2019   BUN 12 10/22/2019   NA 138 10/22/2019   K 3.8 10/22/2019   CL 107 10/22/2019   CO2 22 10/22/2019   Lab Results  Component Value Date   CHOL 126 07/07/2019   HDL 33 (L) 07/07/2019   LDLCALC 74 07/07/2019   TRIG 95 07/07/2019   CHOLHDL 4.3 01/06/2019   No results found for: TSH No results found for: HGBA1C   Review of Systems  Constitutional: Negative for chills, fever and malaise/fatigue.  HENT: Negative for drooling, ear discharge, ear pain and sore throat.   Eyes: Negative for blurred vision.  Respiratory: Negative for cough, shortness of breath and wheezing.   Cardiovascular: Negative for chest pain, palpitations, orthopnea, leg swelling and PND.  Gastrointestinal: Negative for abdominal pain, blood in stool, constipation, diarrhea, nausea and vomiting.  Endocrine: Negative for polydipsia.  Genitourinary: Positive for frequency and nocturia. Negative for dysuria, hematuria, hesitancy, incomplete emptying and urgency.  Musculoskeletal: Negative for back pain, myalgias and neck pain.  Skin: Negative for rash.  Allergic/Immunologic: Negative for environmental allergies.  Neurological: Negative for dizziness, focal weakness, weakness and headaches.  Hematological: Does not bruise/bleed easily.  Psychiatric/Behavioral: Negative for suicidal ideas. The patient is not nervous/anxious.     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   Encounter for screening colonoscopy 10/28/2018   Nocturia more than twice per night 12/30/2017   Hyperlipidemia 05/15/2016   Essential hypertension 05/15/2016   Closed  right hip fracture ([place]) 07/05/2015  No Known Allergies  Past Surgical History:  Procedure Laterality Date   APPENDECTOMY     COLONOSCOPY  01/2009   normal- Dr Bary Castilla   COLONOSCOPY WITH PROPOFOL N/A 11/03/2018   Procedure: COLONOSCOPY WITH PROPOFOL;  Surgeon: Robert Bellow, MD;  Location: [facility] ENDOSCOPY;  Service: Endoscopy;  Laterality: N/A;   EYE SURGERY Bilateral    cataracts   HERNIA REPAIR     TOTAL HIP ARTHROPLASTY Right 07/06/2015   Procedure: TOTAL HIP ARTHROPLASTY;  Surgeon: Corky Mull, MD;  Location: [facility] ORS;  Service: Orthopedics;  Laterality: Right;   TOTAL KNEE ARTHROPLASTY Left 10/20/2019   Procedure: TOTAL KNEE ARTHROPLASTY;  Surgeon: Corky Mull, MD;  Location: [facility] ORS;  Service: Orthopedics;  Laterality: Left;    Social History   Tobacco Use   Smoking status: Never Smoker   Smokeless tobacco: Never Used   Tobacco comment: smoking cessation materials not required  Substance Use Topics   Alcohol use: Yes    Comment: wine occ   Drug use: No     Medication list has been reviewed and updated.  Current Meds  Medication Sig   acetaminophen (TYLENOL) 500 MG tablet Take 500 mg by mouth every 8 (eight) hours as needed for mild pain. Hip pain   atenolol (TENORMIN) 50 MG tablet Take 1 tablet (50 mg total) by mouth 2 (two) times daily.   fenofibrate (TRICOR) 145 MG tablet Take 0.5 tablets (72.5 mg total) by mouth at bedtime.   folic acid (FOLVITE) 1 MG tablet Take 1 mg by mouth daily. Takes Sunday-Friday-Pt does not take on Saturday.- Dr Manuella Ghazi   lisinopril (ZESTRIL) 10 MG tablet Take 1 tablet (10 mg total) by mouth daily. (Patient taking differently: Take 10 mg by mouth every morning. )   Melatonin 3 MG TABS Take 1 tablet by mouth at bedtime as needed (sleep).    methotrexate (RHEUMATREX) 2.5 MG tablet Take 15 mg by mouth once a week. Pt takes on Saturday. Dr Manuella Ghazi   Multiple Vitamins-Minerals (CENTRUM SILVER PO) Take 1 tablet by mouth  daily.   Omega-3 Fatty Acids (FISH OIL) 1000 MG CAPS Take by mouth.   traMADol (ULTRAM) 50 MG tablet Take 1-2 tablets (50-100 mg total) by mouth every 6 (six) hours as needed for moderate pain.   [DISCONTINUED] omega-3 acid ethyl esters (LOVAZA) 1 g capsule TAKE (2) CAPSULES BY MOUTH TWICE DAILY    PHQ 2/9 Scores 12/29/2019 07/07/2019 01/03/2019 12/30/2017  PHQ - 2 Score 0 0 0 0  PHQ- 9 Score 0 0 1 -    BP Readings from Last 3 Encounters:  12/29/19 120/68  10/22/19 117/60  10/13/19 123/80    Physical Exam Vitals and nursing note reviewed.  Constitutional:      Appearance: He is normal weight.  HENT:     Head: Normocephalic.     Right Ear: Tympanic membrane, ear canal and external ear normal.     Left Ear: Tympanic membrane, ear canal and external ear normal.     Nose: Nose normal. No congestion.  Eyes:     General: No scleral icterus.       Right eye: No discharge.        Left eye: No discharge.     Conjunctiva/sclera: Conjunctivae normal.     Pupils: Pupils are equal, round, and reactive to light.  Neck:     Thyroid: No thyromegaly.     Vascular: No JVD.     Trachea: No tracheal deviation.  Cardiovascular:  Rate and Rhythm: Normal rate and regular rhythm.     Heart sounds: Normal heart sounds. No murmur. No friction rub. No gallop.   Pulmonary:     Effort: No respiratory distress.     Breath sounds: Normal breath sounds. No wheezing or rales.  Abdominal:     General: Bowel sounds are normal.     Palpations: Abdomen is soft. There is no mass.     Tenderness: There is no abdominal tenderness. There is no guarding or rebound.  Genitourinary:    Prostate: Normal. Not enlarged, not tender and no nodules present.     Rectum: Normal. Guaiac result negative. No mass.  Musculoskeletal:        General: No tenderness. Normal range of motion.     Cervical back: Normal range of motion and neck supple.  Lymphadenopathy:     Cervical: No cervical adenopathy.  Skin:     General: Skin is warm.     Findings: No rash.  Neurological:     Mental Status: He is alert and oriented to person, place, and time.     Cranial Nerves: No cranial nerve deficit.     Deep Tendon Reflexes: Reflexes are normal and symmetric.     Wt Readings from Last 3 Encounters:  12/29/19 181 lb (82.1 kg)  10/21/19 166 lb 11.2 oz (75.6 kg)  10/13/19 182 lb 1.6 oz (82.6 kg)    BP 120/68    Pulse 72    Ht 5\' 10"  (1.778 m)    Wt 181 lb (82.1 kg)    BMI 25.97 kg/m   Assessment and Plan: 1. Essential hypertension Chronic.  Controlled.  Stable.  Continue lisinopril 10 mg once a day and atenolol 50 mg one tablet day twice a day.  Will check renal function panel for GFR.  Will recheck in 6 months. - lisinopril (ZESTRIL) 10 MG tablet; Take 1 tablet (10 mg total) by mouth daily.  Dispense: 90 tablet; Refill: 1 - atenolol (TENORMIN) 50 MG tablet; Take 1 tablet (50 mg total) by mouth 2 (two) times daily.  Dispense: 180 tablet; Refill: 1 - Renal Function Panel  2. Mixed hyperlipidemia Chronic.  Controlled.  Stable.  Continue TriCor 1/2 tablet nightly.  Will check lipid panel to record levels of control and will recheck in 6 months - fenofibrate (TRICOR) 145 MG tablet; Take 0.5 tablets (72.5 mg total) by mouth at bedtime.  Dispense: 90 tablet; Refill: 1 - Lipid Panel With LDL/HDL Ratio  3. Benign prostatic hyperplasia with nocturia New problem.  Uncontrolled.  Patient having episodes of nocturia which is consistent with incomplete emptying of the bladder perhaps secondary to BPH.  Will trial tamsulosin 0.4 mg daily and will continue if symptoms improved on medication. - PSA - tamsulosin (FLOMAX) 0.4 MG CAPS capsule; Take 1 capsule (0.4 mg total) by mouth daily.  Dispense: 30 capsule; Refill: 3  4. Influenza vaccine needed Discussed and administered - Flu Vaccine QUAD High Dose(Fluad)

## 2019-12-30 LAB — LIPID PANEL WITH LDL/HDL RATIO
Cholesterol, Total: 135 mg/dL (ref 100–199)
HDL: 35 mg/dL — ABNORMAL LOW (ref >39)
LDL Chol Calc (NIH): 83 mg/dL (ref 0–99)
LDL/HDL Ratio: 2.4 ratio (ref 0.0–3.6)
Triglycerides: 85 mg/dL (ref 0–149)
VLDL Cholesterol Cal: 17 mg/dL (ref 5–40)

## 2019-12-30 LAB — RENAL FUNCTION PANEL
Albumin: 4.3 g/dL (ref 3.7–4.7)
BUN/Creatinine Ratio: 15 (ref 10–24)
BUN: 15 mg/dL (ref 8–27)
CO2: 22 mmol/L (ref 20–29)
Calcium: 10 mg/dL (ref 8.6–10.2)
Chloride: 104 mmol/L (ref 96–106)
Creatinine, Ser: 1.01 mg/dL (ref 0.76–1.27)
GFR calc Af Amer: 82 mL/min/{1.73_m2} (ref >59)
GFR calc non Af Amer: 71 mL/min/{1.73_m2} (ref >59)
Glucose: 114 mg/dL — ABNORMAL HIGH (ref 65–99)
Phosphorus: 3.4 mg/dL (ref 2.8–4.1)
Potassium: 4.6 mmol/L (ref 3.5–5.2)
Sodium: 139 mmol/L (ref 134–144)

## 2019-12-30 LAB — PSA: Prostate Specific Ag, Serum: 1.5 ng/mL (ref 0.0–4.0)

## 2020-01-02 DIAGNOSIS — M25662 Stiffness of left knee, not elsewhere classified: Secondary | ICD-10-CM | POA: Diagnosis not present

## 2020-01-02 DIAGNOSIS — M25562 Pain in left knee: Secondary | ICD-10-CM | POA: Diagnosis not present

## 2020-01-02 DIAGNOSIS — Z96652 Presence of left artificial knee joint: Secondary | ICD-10-CM | POA: Diagnosis not present

## 2020-01-02 DIAGNOSIS — M6281 Muscle weakness (generalized): Secondary | ICD-10-CM | POA: Diagnosis not present

## 2020-01-04 DIAGNOSIS — M25562 Pain in left knee: Secondary | ICD-10-CM | POA: Diagnosis not present

## 2020-01-04 DIAGNOSIS — M25662 Stiffness of left knee, not elsewhere classified: Secondary | ICD-10-CM | POA: Diagnosis not present

## 2020-01-04 DIAGNOSIS — Z96652 Presence of left artificial knee joint: Secondary | ICD-10-CM | POA: Diagnosis not present

## 2020-01-04 DIAGNOSIS — M6281 Muscle weakness (generalized): Secondary | ICD-10-CM | POA: Diagnosis not present

## 2020-01-09 ENCOUNTER — Ambulatory Visit: Payer: Medicare Other

## 2020-01-09 DIAGNOSIS — M6281 Muscle weakness (generalized): Secondary | ICD-10-CM | POA: Diagnosis not present

## 2020-01-09 DIAGNOSIS — M25562 Pain in left knee: Secondary | ICD-10-CM | POA: Diagnosis not present

## 2020-01-09 DIAGNOSIS — M25662 Stiffness of left knee, not elsewhere classified: Secondary | ICD-10-CM | POA: Diagnosis not present

## 2020-01-09 DIAGNOSIS — Z96652 Presence of left artificial knee joint: Secondary | ICD-10-CM | POA: Diagnosis not present

## 2020-01-11 DIAGNOSIS — M25562 Pain in left knee: Secondary | ICD-10-CM | POA: Diagnosis not present

## 2020-01-11 DIAGNOSIS — Z96652 Presence of left artificial knee joint: Secondary | ICD-10-CM | POA: Diagnosis not present

## 2020-01-11 DIAGNOSIS — M25662 Stiffness of left knee, not elsewhere classified: Secondary | ICD-10-CM | POA: Diagnosis not present

## 2020-01-11 DIAGNOSIS — M6281 Muscle weakness (generalized): Secondary | ICD-10-CM | POA: Diagnosis not present

## 2020-01-16 DIAGNOSIS — M1612 Unilateral primary osteoarthritis, left hip: Secondary | ICD-10-CM | POA: Diagnosis not present

## 2020-01-18 DIAGNOSIS — M25662 Stiffness of left knee, not elsewhere classified: Secondary | ICD-10-CM | POA: Diagnosis not present

## 2020-01-18 DIAGNOSIS — Z96652 Presence of left artificial knee joint: Secondary | ICD-10-CM | POA: Diagnosis not present

## 2020-01-18 DIAGNOSIS — M6281 Muscle weakness (generalized): Secondary | ICD-10-CM | POA: Diagnosis not present

## 2020-01-18 DIAGNOSIS — M25562 Pain in left knee: Secondary | ICD-10-CM | POA: Diagnosis not present

## 2020-01-25 DIAGNOSIS — M6281 Muscle weakness (generalized): Secondary | ICD-10-CM | POA: Diagnosis not present

## 2020-01-25 DIAGNOSIS — M25562 Pain in left knee: Secondary | ICD-10-CM | POA: Diagnosis not present

## 2020-01-25 DIAGNOSIS — M25662 Stiffness of left knee, not elsewhere classified: Secondary | ICD-10-CM | POA: Diagnosis not present

## 2020-01-25 DIAGNOSIS — Z96652 Presence of left artificial knee joint: Secondary | ICD-10-CM | POA: Diagnosis not present

## 2020-02-01 DIAGNOSIS — Z96652 Presence of left artificial knee joint: Secondary | ICD-10-CM | POA: Diagnosis not present

## 2020-02-01 DIAGNOSIS — M6281 Muscle weakness (generalized): Secondary | ICD-10-CM | POA: Diagnosis not present

## 2020-02-01 DIAGNOSIS — M25662 Stiffness of left knee, not elsewhere classified: Secondary | ICD-10-CM | POA: Diagnosis not present

## 2020-02-01 DIAGNOSIS — M25562 Pain in left knee: Secondary | ICD-10-CM | POA: Diagnosis not present

## 2020-02-06 DIAGNOSIS — M6281 Muscle weakness (generalized): Secondary | ICD-10-CM | POA: Diagnosis not present

## 2020-02-06 DIAGNOSIS — M25662 Stiffness of left knee, not elsewhere classified: Secondary | ICD-10-CM | POA: Diagnosis not present

## 2020-02-06 DIAGNOSIS — M25562 Pain in left knee: Secondary | ICD-10-CM | POA: Diagnosis not present

## 2020-02-06 DIAGNOSIS — Z96652 Presence of left artificial knee joint: Secondary | ICD-10-CM | POA: Diagnosis not present

## 2020-02-08 ENCOUNTER — Ambulatory Visit (INDEPENDENT_AMBULATORY_CARE_PROVIDER_SITE_OTHER): Payer: Medicare PPO

## 2020-02-08 DIAGNOSIS — Z0000 Encounter for general adult medical examination without abnormal findings: Secondary | ICD-10-CM

## 2020-02-08 NOTE — Patient Instructions (Signed)
Mr. Nicholas Monroe , Thank you for taking time to come for your Medicare Wellness Visit. I appreciate your ongoing commitment to your health goals. Please review the following plan we discussed and let me know if I can assist you in the future.   Screening recommendations/referrals: Colonoscopy: done 11/03/18 Recommended yearly ophthalmology/optometry visit for glaucoma screening and checkup Recommended yearly dental visit for hygiene and checkup  Vaccinations: Influenza vaccine: done 12/29/19 Pneumococcal vaccine: done 07/06/18 Tdap vaccine: done 2016 Shingles vaccine: Shingrix discussed. Please contact your pharmacy for coverage information.   Advanced directives: Please bring a copy of your health care power of attorney and living will to the office at your convenience.  Conditions/risks identified: Recommend increasing physical activity as tolerated and directed  Next appointment: Please follow up in one year for your Medicare Annual Wellness visit.    Preventive Care 30 Years and Older, Male Preventive care refers to lifestyle choices and visits with your health care provider that can promote health and wellness. What does preventive care include?  A yearly physical exam. This is also called an annual well check.  Dental exams once or twice a year.  Routine eye exams. Ask your health care provider how often you should have your eyes checked.  Personal lifestyle choices, including:  Daily care of your teeth and gums.  Regular physical activity.  Eating a healthy diet.  Avoiding tobacco and drug use.  Limiting alcohol use.  Practicing safe sex.  Taking low doses of aspirin every day.  Taking vitamin and mineral supplements as recommended by your health care provider. What happens during an annual well check? The services and screenings done by your health care provider during your annual well check will depend on your age, overall health, lifestyle risk factors, and family  history of disease. Counseling  Your health care provider may ask you questions about your:  Alcohol use.  Tobacco use.  Drug use.  Emotional well-being.  Home and relationship well-being.  Sexual activity.  Eating habits.  History of falls.  Memory and ability to understand (cognition).  Work and work Statistician. Screening  You may have the following tests or measurements:  Height, weight, and BMI.  Blood pressure.  Lipid and cholesterol levels. These may be checked every 5 years, or more frequently if you are over 13 years old.  Skin check.  Lung cancer screening. You may have this screening every year starting at age 2 if you have a 30-pack-year history of smoking and currently smoke or have quit within the past 15 years.  Fecal occult blood test (FOBT) of the stool. You may have this test every year starting at age 42.  Flexible sigmoidoscopy or colonoscopy. You may have a sigmoidoscopy every 5 years or a colonoscopy every 10 years starting at age 74.  Prostate cancer screening. Recommendations will vary depending on your family history and other risks.  Hepatitis C blood test.  Hepatitis B blood test.  Sexually transmitted disease (STD) testing.  Diabetes screening. This is done by checking your blood sugar (glucose) after you have not eaten for a while (fasting). You may have this done every 1-3 years.  Abdominal aortic aneurysm (AAA) screening. You may need this if you are a current or former smoker.  Osteoporosis. You may be screened starting at age 51 if you are at high risk. Talk with your health care provider about your test results, treatment options, and if necessary, the need for more tests. Vaccines  Your health care provider  may recommend certain vaccines, such as:  Influenza vaccine. This is recommended every year.  Tetanus, diphtheria, and acellular pertussis (Tdap, Td) vaccine. You may need a Td booster every 10 years.  Zoster vaccine.  You may need this after age 18.  Pneumococcal 13-valent conjugate (PCV13) vaccine. One dose is recommended after age 23.  Pneumococcal polysaccharide (PPSV23) vaccine. One dose is recommended after age 73. Talk to your health care provider about which screenings and vaccines you need and how often you need them. This information is not intended to replace advice given to you by your health care provider. Make sure you discuss any questions you have with your health care provider. Document Released: 12/28/2015 Document Revised: 08/20/2016 Document Reviewed: 10/02/2015 Elsevier Interactive Patient Education  2017 [place] Prevention in the Home Falls can cause injuries. They can happen to people of all ages. There are many things you can do to make your home safe and to help prevent falls. What can I do on the outside of my home?  Regularly fix the edges of walkways and driveways and fix any cracks.  Remove anything that might make you trip as you walk through a door, such as a raised step or threshold.  Trim any bushes or trees on the path to your home.  Use bright outdoor lighting.  Clear any walking paths of anything that might make someone trip, such as rocks or tools.  Regularly check to see if handrails are loose or broken. Make sure that both sides of any steps have handrails.  Any raised decks and porches should have guardrails on the edges.  Have any leaves, snow, or ice cleared regularly.  Use sand or salt on walking paths during winter.  Clean up any spills in your garage right away. This includes oil or grease spills. What can I do in the bathroom?  Use night lights.  Install grab bars by the toilet and in the tub and shower. Do not use towel bars as grab bars.  Use non-skid mats or decals in the tub or shower.  If you need to sit down in the shower, use a plastic, non-slip stool.  Keep the floor dry. Clean up any water that spills on the floor as soon  as it happens.  Remove soap buildup in the tub or shower regularly.  Attach bath mats securely with double-sided non-slip rug tape.  Do not have throw rugs and other things on the floor that can make you trip. What can I do in the bedroom?  Use night lights.  Make sure that you have a light by your bed that is easy to reach.  Do not use any sheets or blankets that are too big for your bed. They should not hang down onto the floor.  Have a firm chair that has side arms. You can use this for support while you get dressed.  Do not have throw rugs and other things on the floor that can make you trip. What can I do in the kitchen?  Clean up any spills right away.  Avoid walking on wet floors.  Keep items that you use a lot in easy-to-reach places.  If you need to reach something above you, use a strong step stool that has a grab bar.  Keep electrical cords out of the way.  Do not use floor polish or wax that makes floors slippery. If you must use wax, use non-skid floor wax.  Do not have throw rugs  and other things on the floor that can make you trip. What can I do with my stairs?  Do not leave any items on the stairs.  Make sure that there are handrails on both sides of the stairs and use them. Fix handrails that are broken or loose. Make sure that handrails are as long as the stairways.  Check any carpeting to make sure that it is firmly attached to the stairs. Fix any carpet that is loose or worn.  Avoid having throw rugs at the top or bottom of the stairs. If you do have throw rugs, attach them to the floor with carpet tape.  Make sure that you have a light switch at the top of the stairs and the bottom of the stairs. If you do not have them, ask someone to add them for you. What else can I do to help prevent falls?  Wear shoes that:  Do not have high heels.  Have rubber bottoms.  Are comfortable and fit you well.  Are closed at the toe. Do not wear sandals.  If  you use a stepladder:  Make sure that it is fully opened. Do not climb a closed stepladder.  Make sure that both sides of the stepladder are locked into place.  Ask someone to hold it for you, if possible.  Clearly mark and make sure that you can see:  Any grab bars or handrails.  First and last steps.  Where the edge of each step is.  Use tools that help you move around (mobility aids) if they are needed. These include:  Canes.  Walkers.  Scooters.  Crutches.  Turn on the lights when you go into a dark area. Replace any light bulbs as soon as they burn out.  Set up your furniture so you have a clear path. Avoid moving your furniture around.  If any of your floors are uneven, fix them.  If there are any pets around you, be aware of where they are.  Review your medicines with your doctor. Some medicines can make you feel dizzy. This can increase your chance of falling. Ask your doctor what other things that you can do to help prevent falls. This information is not intended to replace advice given to you by your health care provider. Make sure you discuss any questions you have with your health care provider. Document Released: 09/27/2009 Document Revised: 05/08/2016 Document Reviewed: 01/05/2015 Elsevier Interactive Patient Education  2017 [employer].

## 2020-02-08 NOTE — Progress Notes (Signed)
Subjective:   Usiel Zuege is a 79 y.o. male who presents for Medicare Annual/Subsequent preventive examination.  Virtual Visit via Telephone Note  I connected with [name] on 02/08/20 at  1:20 PM EST by telephone and verified that I am speaking with the correct person using two identifiers.  Medicare Annual Wellness visit completed telephonically due to Covid-19 pandemic.   Location: Patient: home Provider: office   I discussed the limitations, risks, security and privacy concerns of performing an evaluation and management service by telephone and the availability of in person appointments. The patient expressed understanding and agreed to proceed.  Some vital signs may be absent or patient reported.   Clemetine Marker, LPN    Review of Systems:   Cardiac Risk Factors include: advanced age (>65men, >67 women);hypertension;male gender;dyslipidemia     Objective:    Vitals: There were no vitals taken for this visit.  There is no height or weight on file to calculate BMI.  Advanced Directives 02/08/2020 10/20/2019 10/20/2019 10/20/2019 01/03/2019 11/03/2018 07/30/2018  Does Patient Have a Medical Advance Directive? Yes Yes Yes No Yes Yes No  Type of Paramedic of [place];Living will Healthcare Power of [place] -  Does patient want to make changes to medical advance directive? - No - Patient declined - - - - -  Copy of [place] in Chart? No - copy requested No - copy requested - - No - copy requested - -  Would patient like information on creating a medical advance directive? - No - Patient declined No - Patient declined No - Patient declined - - -    Tobacco Social History   Tobacco Use  Smoking Status Never Smoker  Smokeless Tobacco Never Used  Tobacco Comment   smoking cessation materials not required     Counseling given: Not  Answered Comment: smoking cessation materials not required   Clinical Intake:  Pre-visit preparation completed: Yes  Pain : 0-10 Pain Score: 7 (when walking) Pain Type: Acute pain Pain Location: Knee Pain Orientation: Left Pain Descriptors / Indicators: Aching, Discomfort, Sore Pain Onset: More than a month ago Pain Frequency: Intermittent     Nutritional Risks: None Diabetes: No  How often do you need to have someone help you when you read instructions, pamphlets, or other written materials from your doctor or pharmacy?: 1 - Never  Interpreter Needed?: No  Information entered by :: Clemetine Marker LPN  Past Medical History:  Diagnosis Date  . Arthritis   . Hyperlipidemia   . Hypertension   . 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 APPENDECTOMY    . COLONOSCOPY  01/2009   normal- Dr Bary Castilla  . COLONOSCOPY WITH PROPOFOL N/A 11/03/2018   Procedure: COLONOSCOPY WITH PROPOFOL;  Surgeon: Robert Bellow, MD;  Location: [facility] ENDOSCOPY;  Service: Endoscopy;  Laterality: N/A;  . EYE SURGERY Bilateral    cataracts  . HERNIA REPAIR    . TOTAL HIP ARTHROPLASTY Right 07/06/2015   Procedure: TOTAL HIP ARTHROPLASTY;  Surgeon: Corky Mull, MD;  Location: [facility] ORS;  Service: Orthopedics;  Laterality: Right;  . TOTAL KNEE ARTHROPLASTY Left 10/20/2019   Procedure: TOTAL KNEE ARTHROPLASTY;  Surgeon: Corky Mull, MD;  Location: [facility] ORS;  Service: Orthopedics;  Laterality: Left;   Family History  Problem Relation Age of Onset  .  Healthy Father   . Diabetes Brother   . Hypertension Mother   . Colon cancer Mother 72  . Diabetes Daughter        type 1  . Diabetes Brother   . Stroke Brother    Social History   Socioeconomic History  . Marital status: Married    Spouse name: Not on file  . Number of children: 2  . Years of education: Not on file  . Highest education level: Master's degree (e.g., MA, MS, MEng,  MEd, MSW, MBA)  Occupational History  . Occupation: Retired  Tobacco Use  . Smoking status: Never Smoker  . Smokeless tobacco: Never Used  . Tobacco comment: smoking cessation materials not required  Substance and Sexual Activity  . Alcohol use: Not Currently  . Drug use: No  . Sexual activity: Not Currently  Other Topics Concern  . Not on file  Social History Narrative  . Not on file   Social Determinants of Health   Financial Resource Strain: Low Risk   . Difficulty of Paying Living Expenses: Not hard at all  Food Insecurity: No Food Insecurity  . Worried About Charity fundraiser in the Last Year: Never true  . Ran Out of Food in the Last Year: Never true  Transportation Needs: No Transportation Needs  . Lack of Transportation (Medical): No  . Lack of Transportation (Non-Medical): No  Physical Activity: Inactive  . Days of Exercise per Week: 0 days  . Minutes of Exercise per Session: 0 min  Stress: No Stress Concern Present  . Feeling of Stress : Only a little  Social Connections: Slightly Isolated  . Frequency of Communication with Friends and Family: More than three times a week  . Frequency of Social Gatherings with Friends and Family: More than three times a week  . Attends Religious Services: More than 4 times per year  . Active Member of Clubs or Organizations: No  . Attends Archivist Meetings: Never  . Marital Status: Married    Outpatient Encounter Medications as of 02/08/2020  Medication Sig  . acetaminophen (TYLENOL) 500 MG tablet Take 500 mg by mouth every 8 (eight) hours as needed for mild pain. Hip pain  . atenolol (TENORMIN) 50 MG tablet Take 1 tablet (50 mg total) by mouth 2 (two) times daily.  . fenofibrate (TRICOR) 145 MG tablet Take 0.5 tablets (72.5 mg total) by mouth at bedtime.  . folic acid (FOLVITE) 1 MG tablet Take 1 mg by mouth daily. Takes Sunday-Friday-Pt does not take on Saturday.- Dr Manuella Ghazi  . lisinopril (ZESTRIL) 10 MG tablet Take  1 tablet (10 mg total) by mouth daily.  . Melatonin 3 MG TABS Take 1 tablet by mouth at bedtime as needed (sleep).   . methotrexate (RHEUMATREX) 2.5 MG tablet Take 15 mg by mouth once a week. Pt takes on Saturday. Dr Manuella Ghazi  . Multiple Vitamins-Minerals (CENTRUM SILVER PO) Take 1 tablet by mouth daily.  . Omega-3 Fatty Acids (FISH OIL) 1000 MG CAPS Take 2 capsules by mouth in the morning and at bedtime.   . tamsulosin (FLOMAX) 0.4 MG CAPS capsule Take 1 capsule (0.4 mg total) by mouth daily.  . traMADol (ULTRAM) 50 MG tablet Take 1-2 tablets (50-100 mg total) by mouth every 6 (six) hours as needed for moderate pain.   No facility-administered encounter medications on file as of 02/08/2020.    Activities of Daily Living In your present state of health, do you have any difficulty  performing the following activities: 02/08/2020 10/20/2019  Hearing? N N  Comment declines hearing aids -  Vision? N N  Difficulty concentrating or making decisions? N N  Walking or climbing stairs? N Y  Comment - -  Dressing or bathing? N N  Doing errands, shopping? N N  Preparing Food and eating ? N -  Using the Toilet? N -  In the past six months, have you accidently leaked urine? Y -  Do you have problems with loss of bowel control? N -  Managing your Medications? N -  Managing your Finances? N -  Housekeeping or managing your Housekeeping? N -  Some recent data might be hidden    Patient Care Team: Juline Patch, MD as PCP - General (Family Medicine) Vladimir Crofts, MD as Consulting Physician (Neurology)   Assessment:   This is a routine wellness examination for [place].  Exercise Activities and Dietary recommendations Current Exercise Habits: The patient does not participate in regular exercise at present, Exercise limited by: orthopedic condition(s)  Goals    . Exercise 150 min/wk Moderate Activity     Recommend to exercise for at least 150 minutes per week.        Fall Risk Fall Risk   02/08/2020 12/29/2019 01/03/2019 10/28/2018 12/30/2017  Falls in the past year? 0 0 0 0 No  Number falls in past yr: 0 - 0 - -  Injury with Fall? 0 - - - -  Risk for fall due to : Impaired balance/gait Impaired balance/gait - - History of fall(s)  Risk for fall due to: Comment - Had knee replacement - - painting and fell off ladder  Follow up Falls prevention discussed Falls evaluation completed Falls prevention discussed - -   FALL RISK PREVENTION PERTAINING TO THE HOME:  Any stairs in or around the home? Yes  If so, do they handrails? Yes   Home free of loose throw rugs in walkways, pet beds, electrical cords, etc? Yes  Adequate lighting in your home to reduce risk of falls? Yes   ASSISTIVE DEVICES UTILIZED TO PREVENT FALLS:  Life alert? No  Use of a cane, walker or w/c? Yes  Grab bars in the bathroom? Yes  Shower chair or bench in shower? No  Elevated toilet seat or a handicapped toilet? No   DME ORDERS:  DME order needed?  No   TIMED UP AND GO:  Was the test performed? No . Telephonic visit.   Education: Fall risk prevention has been discussed.  Intervention(s) required? No    Depression Screen PHQ 2/9 Scores 02/08/2020 12/29/2019 07/07/2019 01/03/2019  PHQ - 2 Score 0 0 0 0  PHQ- 9 Score - 0 0 1    Cognitive Function Pt declines 6CIT for 2021 AWV     6CIT Screen 01/03/2019 12/30/2017  What Year? 0 points 0 points  What month? 0 points 0 points  What time? 0 points 0 points  Count back from 20 0 points 0 points  Months in reverse 0 points 0 points  Repeat phrase 0 points 0 points  Total Score 0 0    Immunization History  Administered Date(s) Administered  . Fluad Quad(high Dose 65+) 12/29/2019  . Influenza, High Dose Seasonal PF 10/23/2017, 10/05/2018  . Influenza,inj,Quad PF,6+ Mos 09/27/2015  . PFIZER SARS-COV-2 Vaccination 01/20/2020  . Pneumococcal Conjugate-13 01/14/2017  . Pneumococcal Polysaccharide-23 07/06/2018    Qualifies for Shingles Vaccine? Yes  . Due for Shingrix. Education has been provided regarding the  importance of this vaccine. Pt has been advised to call insurance company to determine out of pocket expense. Advised may also receive vaccine at local pharmacy or Health Dept. Verbalized acceptance and understanding.  Tdap: Up to date  Flu Vaccine: Up to date  Pneumococcal Vaccine: Up to date    Screening Tests Health Maintenance  Topic Date Due  . TETANUS/TDAP  01/14/2027  . INFLUENZA VACCINE  Completed  . PNA vac Low Risk Adult  Completed   Cancer Screenings:  Colorectal Screening: Completed 11/03/18. Repeat every 5 years;   Lung Cancer Screening: (Low Dose CT Chest recommended if Age 77-80 years, 30 pack-year currently smoking OR have quit w/in 15years.) does not qualify.   Additional Screening:  Hepatitis C Screening: no longer required  Vision Screening: Recommended annual ophthalmology exams for early detection of glaucoma and other disorders of the eye. Is the patient up to date with their annual eye exam?  No  Who is the provider or what is the name of the office in which the pt attends annual eye exams? [place] Screening: Recommended annual dental exams for proper oral hygiene  Community Resource Referral:  CRR required this visit?  No       Plan:    I have personally reviewed and addressed the Medicare Annual Wellness questionnaire and have noted the following in the patient's chart:  A. Medical and social history B. Use of alcohol, tobacco or illicit drugs  C. Current medications and supplements D. Functional ability and status E.  Nutritional status F.  Physical activity G. Advance directives H. List of other physicians I.  Hospitalizations, surgeries, and ER visits in previous 12 months J.  [place] such as hearing and vision if needed, cognitive and depression L. Referrals and appointments   In addition, I have reviewed and discussed with patient certain  preventive protocols, quality metrics, and best practice recommendations. A written personalized care plan for preventive services as well as general preventive health recommendations were provided to patient.   Signed,  Clemetine Marker, LPN Nurse Health Advisor   Nurse Notes: pt scheduled for left hip replacement on 03/06/20

## 2020-02-13 DIAGNOSIS — M1612 Unilateral primary osteoarthritis, left hip: Secondary | ICD-10-CM | POA: Diagnosis not present

## 2020-02-20 ENCOUNTER — Other Ambulatory Visit: Payer: Self-pay | Admitting: Surgery

## 2020-02-22 ENCOUNTER — Other Ambulatory Visit: Payer: Self-pay

## 2020-02-22 ENCOUNTER — Other Ambulatory Visit: Payer: Self-pay | Admitting: Family Medicine

## 2020-02-22 DIAGNOSIS — E782 Mixed hyperlipidemia: Secondary | ICD-10-CM

## 2020-02-22 NOTE — Progress Notes (Unsigned)
Pt will pick up otc omega 3 and recheck in 6-8 weeks

## 2020-02-23 ENCOUNTER — Encounter
Admission: RE | Admit: 2020-02-23 | Discharge: 2020-02-23 | Disposition: A | Discharge status: Home / Self Care | Payer: Medicare PPO | Source: Ambulatory Visit | Attending: Surgery | Admitting: Surgery

## 2020-02-23 ENCOUNTER — Other Ambulatory Visit: Payer: Self-pay

## 2020-02-23 NOTE — Patient Instructions (Signed)
Your procedure is scheduled on: Tues 3/23 Report to Day [facility]. [facility] To find out your arrival time please call [phone] between 1PM - 3PM on Mon 3/22.  Remember: Instructions that are not followed completely may result in serious medical risk,  up to and including death, or upon the discretion of your surgeon and anesthesiologist your  surgery may need to be rescheduled.     _X__ 1. Do not eat food after midnight the night before your procedure.                 No gum chewing or hard candies. You may drink clear liquids up to 2 hours                 before you are scheduled to arrive for your surgery- DO not drink clear                 liquids within 2 hours of the start of your surgery.                 Clear Liquids include:  water, apple juice without pulp, clear Gatorade, G2 or                  Gatorade Zero (avoid Red/Purple/Blue), Black Coffee or Tea (Do not add                 anything to coffee or tea). _____2.   Complete the carbohydrate drink provided to you, 2 hours before arrival.  __X__2.  On the morning of surgery brush your teeth with toothpaste and water, you                may rinse your mouth with mouthwash if you wish.  Do not swallow any toothpaste of mouthwash.     ___ 3.  No Alcohol for 24 hours before or after surgery.   ___ 4.  Do Not Smoke or use e-cigarettes For 24 Hours Prior to Your Surgery.                 Do not use any chewable tobacco products for at least 6 hours prior to                 surgery.  ____  5.  Bring all medications with you on the day of surgery if instructed.   _x___  6.  Notify your doctor if there is any change in your medical condition      (cold, fever, infections).     Do not wear jewelry, Do not wear lotions, powders, or perfumes. You may wear deodorant. Do not shave 48 hours prior to surgery. Men may shave face and neck. Do not bring valuables to the hospital.    [facility] is not  responsible for any belongings or valuables.  Contacts, dentures or bridgework may not be worn into surgery. Leave your suitcase in the car. After surgery it may be brought to your room. For patients admitted to the hospital, discharge time is determined by your treatment team.   Patients discharged the day of surgery will not be allowed to drive home.   Make arrangements for someone to be with you for the first 24 hours of your Same Day Discharge.    Please read over the following fact sheets that you were given:    _x___ Take these medicines the morning of surgery with A SIP OF WATER:    1. acetaminophen (TYLENOL) 500  MG tablet or Tramadol if needed  2. atenolol (TENORMIN) 50 MG tablet  3. fenofibrate (TRICOR) 145 MG tablet  4.  5.  6.  ____ Fleet Enema (as directed)   _x___ Use CHG Soap (or wipes) as directed  ____ Use Benzoyl Peroxide Gel as instructed  ____ Use inhalers on the day of surgery  ____ Stop metformin 2 days prior to surgery    ____ Take 1/2 of usual insulin dose the night before surgery. No insulin the morning          of surgery.   __x__ Stop aspirin on Monday 3/15  __x__ Stop Anti-inflammatories  ibuprofen aleve or aspirin after Mon 3/15   __x__ Stop supplements until after surgery. Fish Oil on Mon 3/15        Melatonin 3 mg on 3/22  ____ Bring C-Pap to the hospital.

## 2020-02-24 ENCOUNTER — Other Ambulatory Visit: Payer: Self-pay | Admitting: Family Medicine

## 2020-02-24 DIAGNOSIS — E782 Mixed hyperlipidemia: Secondary | ICD-10-CM

## 2020-02-27 ENCOUNTER — Encounter
Admission: RE | Admit: 2020-02-27 | Discharge: 2020-02-27 | Disposition: A | Discharge status: Home / Self Care | Payer: Medicare PPO | Source: Ambulatory Visit | Attending: Surgery | Admitting: Surgery

## 2020-02-27 ENCOUNTER — Other Ambulatory Visit: Payer: Self-pay

## 2020-02-27 DIAGNOSIS — Z01810 Encounter for preprocedural cardiovascular examination: Secondary | ICD-10-CM | POA: Diagnosis not present

## 2020-02-27 DIAGNOSIS — R001 Bradycardia, unspecified: Secondary | ICD-10-CM | POA: Insufficient documentation

## 2020-02-27 DIAGNOSIS — Z01812 Encounter for preprocedural laboratory examination: Secondary | ICD-10-CM | POA: Insufficient documentation

## 2020-02-27 LAB — SURGICAL PCR SCREEN
MRSA, PCR: NEGATIVE
Staphylococcus aureus: NEGATIVE

## 2020-03-02 ENCOUNTER — Other Ambulatory Visit
Admission: RE | Admit: 2020-03-02 | Discharge: 2020-03-02 | Disposition: A | Discharge status: Home / Self Care | Payer: Medicare PPO | Source: Ambulatory Visit | Attending: Surgery | Admitting: Surgery

## 2020-03-02 ENCOUNTER — Other Ambulatory Visit: Payer: Self-pay

## 2020-03-02 DIAGNOSIS — Z20822 Contact with and (suspected) exposure to covid-19: Secondary | ICD-10-CM | POA: Diagnosis not present

## 2020-03-02 DIAGNOSIS — Z01812 Encounter for preprocedural laboratory examination: Secondary | ICD-10-CM | POA: Insufficient documentation

## 2020-03-03 LAB — SARS CORONAVIRUS 2 (TAT 6-24 HRS): SARS Coronavirus 2: NEGATIVE

## 2020-03-06 ENCOUNTER — Inpatient Hospital Stay: Payer: Medicare PPO | Admitting: Registered Nurse

## 2020-03-06 ENCOUNTER — Encounter: Payer: Self-pay | Admitting: Surgery

## 2020-03-06 ENCOUNTER — Inpatient Hospital Stay: Payer: Medicare PPO

## 2020-03-06 ENCOUNTER — Other Ambulatory Visit: Payer: Self-pay

## 2020-03-06 ENCOUNTER — Encounter
Admission: RE | Disposition: A | Discharge status: Home Health | Payer: Self-pay | Source: Home / Self Care | Attending: Surgery

## 2020-03-06 ENCOUNTER — Inpatient Hospital Stay
Admission: RE | Admit: 2020-03-06 | Discharge: 2020-03-09 | DRG: 470 | Disposition: A | Discharge status: Home Health | Payer: Medicare PPO | Attending: Surgery | Admitting: Surgery

## 2020-03-06 DIAGNOSIS — D62 Acute posthemorrhagic anemia: Secondary | ICD-10-CM | POA: Diagnosis not present

## 2020-03-06 DIAGNOSIS — M1612 Unilateral primary osteoarthritis, left hip: Secondary | ICD-10-CM | POA: Diagnosis not present

## 2020-03-06 DIAGNOSIS — Z96642 Presence of left artificial hip joint: Secondary | ICD-10-CM

## 2020-03-06 DIAGNOSIS — I10 Essential (primary) hypertension: Secondary | ICD-10-CM | POA: Diagnosis present

## 2020-03-06 DIAGNOSIS — E785 Hyperlipidemia, unspecified: Secondary | ICD-10-CM | POA: Diagnosis present

## 2020-03-06 DIAGNOSIS — Z471 Aftercare following joint replacement surgery: Secondary | ICD-10-CM | POA: Diagnosis not present

## 2020-03-06 HISTORY — PX: TOTAL HIP ARTHROPLASTY: SHX124

## 2020-03-06 SURGERY — ARTHROPLASTY, HIP, TOTAL,POSTERIOR APPROACH
Anesthesia: Spinal | Site: Hip | Laterality: Left

## 2020-03-06 MED ORDER — HYDROMORPHONE HCL 1 MG/ML IJ SOLN: 0.2500 mg | INTRAMUSCULAR | Status: DC | PRN

## 2020-03-06 MED ORDER — LACTATED RINGERS IV BOLUS
500.0000 mL | Freq: Once | INTRAVENOUS | Status: AC
  Administered 2020-03-06: 500 mL via INTRAVENOUS

## 2020-03-06 MED ORDER — ACETAMINOPHEN 10 MG/ML IV SOLN
INTRAVENOUS | Status: AC
  Filled 2020-03-06: qty 100

## 2020-03-06 MED ORDER — KETOROLAC TROMETHAMINE 15 MG/ML IJ SOLN
INTRAMUSCULAR | Status: AC
  Administered 2020-03-06: 15 mg via INTRAVENOUS
  Filled 2020-03-06: qty 1

## 2020-03-06 MED ORDER — CEFAZOLIN SODIUM-DEXTROSE 2-4 GM/100ML-% IV SOLN
2.0000 g | Freq: Four times a day (QID) | INTRAVENOUS | Status: AC
  Administered 2020-03-06 – 2020-03-07 (×3): 2 g via INTRAVENOUS
  Filled 2020-03-06 (×3): qty 100

## 2020-03-06 MED ORDER — METOCLOPRAMIDE HCL 10 MG PO TABS: 5.0000 mg | ORAL_TABLET | Freq: Three times a day (TID) | ORAL | Status: DC | PRN

## 2020-03-06 MED ORDER — PROPOFOL 10 MG/ML IV BOLUS
INTRAVENOUS | Status: AC
  Filled 2020-03-06: qty 20

## 2020-03-06 MED ORDER — BUPIVACAINE HCL (PF) 0.5 % IJ SOLN
INTRAMUSCULAR | Status: AC
  Filled 2020-03-06: qty 10

## 2020-03-06 MED ORDER — EPHEDRINE SULFATE 50 MG/ML IJ SOLN
INTRAMUSCULAR | Status: DC | PRN
  Administered 2020-03-06 (×10): 10 mg via INTRAVENOUS

## 2020-03-06 MED ORDER — PROPOFOL 500 MG/50ML IV EMUL
INTRAVENOUS | Status: AC
  Filled 2020-03-06: qty 50

## 2020-03-06 MED ORDER — CEFAZOLIN SODIUM-DEXTROSE 2-4 GM/100ML-% IV SOLN
INTRAVENOUS | Status: AC
  Filled 2020-03-06: qty 100

## 2020-03-06 MED ORDER — LISINOPRIL 10 MG PO TABS
10.0000 mg | ORAL_TABLET | Freq: Every day | ORAL | Status: DC
  Administered 2020-03-07 – 2020-03-09 (×2): 10 mg via ORAL
  Filled 2020-03-06 (×2): qty 1

## 2020-03-06 MED ORDER — ONDANSETRON HCL 4 MG PO TABS
4.0000 mg | ORAL_TABLET | Freq: Four times a day (QID) | ORAL | Status: DC | PRN
  Administered 2020-03-08: 4 mg via ORAL
  Filled 2020-03-06: qty 1

## 2020-03-06 MED ORDER — TRANEXAMIC ACID 1000 MG/10ML IV SOLN
INTRAVENOUS | Status: AC
  Filled 2020-03-06: qty 10

## 2020-03-06 MED ORDER — BISACODYL 10 MG RE SUPP
10.0000 mg | Freq: Every day | RECTAL | Status: DC | PRN
  Administered 2020-03-09: 10 mg via RECTAL
  Filled 2020-03-06: qty 1

## 2020-03-06 MED ORDER — SODIUM CHLORIDE 0.9 % IV SOLN
INTRAVENOUS | Status: DC | PRN
  Administered 2020-03-06: 60 mL

## 2020-03-06 MED ORDER — MELATONIN 5 MG PO TABS
5.0000 mg | ORAL_TABLET | Freq: Every day | ORAL | Status: DC
  Administered 2020-03-06 – 2020-03-08 (×3): 5 mg via ORAL
  Filled 2020-03-06 (×3): qty 1

## 2020-03-06 MED ORDER — SODIUM CHLORIDE (PF) 0.9 % IJ SOLN
INTRAMUSCULAR | Status: AC
  Filled 2020-03-06: qty 50

## 2020-03-06 MED ORDER — BUPIVACAINE HCL (PF) 0.5 % IJ SOLN
INTRAMUSCULAR | Status: DC | PRN
  Administered 2020-03-06: 3 mL

## 2020-03-06 MED ORDER — ACETAMINOPHEN 500 MG PO TABS
1000.0000 mg | ORAL_TABLET | Freq: Four times a day (QID) | ORAL | Status: AC
  Administered 2020-03-06 (×2): 1000 mg via ORAL
  Filled 2020-03-06 (×3): qty 2

## 2020-03-06 MED ORDER — DIPHENHYDRAMINE HCL 12.5 MG/5ML PO ELIX: 12.5000 mg | ORAL_SOLUTION | ORAL | Status: DC | PRN

## 2020-03-06 MED ORDER — TRANEXAMIC ACID 1000 MG/10ML IV SOLN
INTRAVENOUS | Status: DC | PRN
  Administered 2020-03-06: 1000 mg via TOPICAL

## 2020-03-06 MED ORDER — FAMOTIDINE 20 MG PO TABS
ORAL_TABLET | ORAL | Status: AC
  Administered 2020-03-06: 20 mg via ORAL
  Filled 2020-03-06: qty 1

## 2020-03-06 MED ORDER — ATENOLOL 25 MG PO TABS
50.0000 mg | ORAL_TABLET | Freq: Two times a day (BID) | ORAL | Status: DC
  Administered 2020-03-07 – 2020-03-09 (×4): 50 mg via ORAL
  Filled 2020-03-06 (×4): qty 2

## 2020-03-06 MED ORDER — BUPIVACAINE LIPOSOME 1.3 % IJ SUSP
INTRAMUSCULAR | Status: AC
  Filled 2020-03-06: qty 20

## 2020-03-06 MED ORDER — BUPIVACAINE-EPINEPHRINE (PF) 0.5% -1:200000 IJ SOLN
INTRAMUSCULAR | Status: DC | PRN
  Administered 2020-03-06: 30 mL via PERINEURAL

## 2020-03-06 MED ORDER — FAMOTIDINE 20 MG PO TABS: 20.0000 mg | ORAL_TABLET | Freq: Once | ORAL | Status: AC

## 2020-03-06 MED ORDER — PANTOPRAZOLE SODIUM 40 MG PO TBEC
40.0000 mg | DELAYED_RELEASE_TABLET | Freq: Every day | ORAL | Status: DC
  Administered 2020-03-07 – 2020-03-09 (×3): 40 mg via ORAL
  Filled 2020-03-06 (×2): qty 1

## 2020-03-06 MED ORDER — EPHEDRINE SULFATE 50 MG/ML IJ SOLN: 20.0000 mg | Freq: Once | INTRAMUSCULAR | Status: AC

## 2020-03-06 MED ORDER — FISH OIL 1000 MG PO CAPS: 2000.0000 mg | ORAL_CAPSULE | Freq: Every day | ORAL | Status: DC

## 2020-03-06 MED ORDER — TRAMADOL HCL 50 MG PO TABS
50.0000 mg | ORAL_TABLET | Freq: Four times a day (QID) | ORAL | Status: DC | PRN
  Administered 2020-03-07 – 2020-03-09 (×3): 50 mg via ORAL
  Filled 2020-03-06 (×3): qty 1

## 2020-03-06 MED ORDER — PROPOFOL 500 MG/50ML IV EMUL
INTRAVENOUS | Status: DC | PRN
  Administered 2020-03-06: 80 ug/kg/min via INTRAVENOUS

## 2020-03-06 MED ORDER — FLEET ENEMA 7-19 GM/118ML RE ENEM: 1.0000 | ENEMA | Freq: Once | RECTAL | Status: DC | PRN

## 2020-03-06 MED ORDER — LACTATED RINGERS IV SOLN: INTRAVENOUS | Status: DC

## 2020-03-06 MED ORDER — PROPOFOL 10 MG/ML IV BOLUS
INTRAVENOUS | Status: DC | PRN
  Administered 2020-03-06: 50 mg via INTRAVENOUS

## 2020-03-06 MED ORDER — PHENYLEPHRINE HCL (PRESSORS) 10 MG/ML IV SOLN
INTRAVENOUS | Status: DC | PRN
  Administered 2020-03-06 (×5): 100 ug via INTRAVENOUS

## 2020-03-06 MED ORDER — CEFAZOLIN SODIUM-DEXTROSE 2-4 GM/100ML-% IV SOLN
2.0000 g | INTRAVENOUS | Status: AC
  Administered 2020-03-06: 2 g via INTRAVENOUS

## 2020-03-06 MED ORDER — EPHEDRINE SULFATE 50 MG/ML IJ SOLN
INTRAMUSCULAR | Status: AC
  Administered 2020-03-06: 20 mg via INTRAVENOUS
  Filled 2020-03-06: qty 1

## 2020-03-06 MED ORDER — METOCLOPRAMIDE HCL 5 MG/ML IJ SOLN: 5.0000 mg | Freq: Three times a day (TID) | INTRAMUSCULAR | Status: DC | PRN

## 2020-03-06 MED ORDER — TETRACAINE HCL 1 % IJ SOLN
INTRAMUSCULAR | Status: DC | PRN
  Administered 2020-03-06: 1.5 mg via INTRASPINAL

## 2020-03-06 MED ORDER — KETAMINE HCL 10 MG/ML IJ SOLN
INTRAMUSCULAR | Status: DC | PRN
  Administered 2020-03-06: 10 mg via INTRAVENOUS
  Administered 2020-03-06: 30 mg via INTRAVENOUS

## 2020-03-06 MED ORDER — OMEGA-3-ACID ETHYL ESTERS 1 G PO CAPS
2.0000 g | ORAL_CAPSULE | Freq: Every day | ORAL | Status: DC
  Administered 2020-03-07 – 2020-03-09 (×3): 2 g via ORAL
  Filled 2020-03-06 (×4): qty 2

## 2020-03-06 MED ORDER — EPINEPHRINE PF 1 MG/ML IJ SOLN
INTRAMUSCULAR | Status: AC
  Filled 2020-03-06: qty 1

## 2020-03-06 MED ORDER — SODIUM CHLORIDE (PF) 0.9 % IJ SOLN
INTRAMUSCULAR | Status: AC
  Filled 2020-03-06: qty 10

## 2020-03-06 MED ORDER — CHLORHEXIDINE GLUCONATE 4 % EX LIQD
60.0000 mL | Freq: Once | CUTANEOUS | Status: AC
  Administered 2020-03-06: 4 via TOPICAL

## 2020-03-06 MED ORDER — FENOFIBRATE 54 MG PO TABS
54.0000 mg | ORAL_TABLET | Freq: Every day | ORAL | Status: DC
  Administered 2020-03-07 – 2020-03-08 (×2): 54 mg via ORAL
  Filled 2020-03-06 (×3): qty 1

## 2020-03-06 MED ORDER — ACETAMINOPHEN 10 MG/ML IV SOLN
INTRAVENOUS | Status: DC | PRN
  Administered 2020-03-06: 1000 mg via INTRAVENOUS

## 2020-03-06 MED ORDER — SODIUM CHLORIDE 0.9 % IV SOLN
INTRAVENOUS | Status: DC | PRN
  Administered 2020-03-06: 20 ug/min via INTRAVENOUS

## 2020-03-06 MED ORDER — FOLIC ACID 1 MG PO TABS
1.0000 mg | ORAL_TABLET | Freq: Every day | ORAL | Status: DC
  Administered 2020-03-07 – 2020-03-09 (×3): 1 mg via ORAL
  Filled 2020-03-06 (×3): qty 1

## 2020-03-06 MED ORDER — ENOXAPARIN SODIUM 40 MG/0.4ML ~~LOC~~ SOLN
40.0000 mg | SUBCUTANEOUS | Status: DC
  Administered 2020-03-07 – 2020-03-09 (×3): 40 mg via SUBCUTANEOUS
  Filled 2020-03-06 (×3): qty 0.4

## 2020-03-06 MED ORDER — MAGNESIUM HYDROXIDE 400 MG/5ML PO SUSP
30.0000 mL | Freq: Every day | ORAL | Status: DC | PRN
  Administered 2020-03-08: 30 mL via ORAL
  Filled 2020-03-06: qty 30

## 2020-03-06 MED ORDER — EPHEDRINE 5 MG/ML INJ
INTRAVENOUS | Status: AC
  Filled 2020-03-06: qty 10

## 2020-03-06 MED ORDER — ONDANSETRON HCL 4 MG/2ML IJ SOLN: 4.0000 mg | Freq: Four times a day (QID) | INTRAMUSCULAR | Status: DC | PRN

## 2020-03-06 MED ORDER — FENTANYL CITRATE (PF) 100 MCG/2ML IJ SOLN: 25.0000 ug | INTRAMUSCULAR | Status: DC | PRN

## 2020-03-06 MED ORDER — ONDANSETRON HCL 4 MG/2ML IJ SOLN: 4.0000 mg | Freq: Once | INTRAMUSCULAR | Status: DC | PRN

## 2020-03-06 MED ORDER — KETOROLAC TROMETHAMINE 15 MG/ML IJ SOLN
7.5000 mg | Freq: Four times a day (QID) | INTRAMUSCULAR | Status: AC
  Administered 2020-03-06 – 2020-03-07 (×4): 7.5 mg via INTRAVENOUS
  Filled 2020-03-06 (×4): qty 1

## 2020-03-06 MED ORDER — DOCUSATE SODIUM 100 MG PO CAPS
100.0000 mg | ORAL_CAPSULE | Freq: Two times a day (BID) | ORAL | Status: DC
  Administered 2020-03-06 – 2020-03-09 (×5): 100 mg via ORAL
  Filled 2020-03-06 (×5): qty 1

## 2020-03-06 MED ORDER — GLYCOPYRROLATE 0.2 MG/ML IJ SOLN
INTRAMUSCULAR | Status: AC
  Filled 2020-03-06: qty 1

## 2020-03-06 MED ORDER — ASPIRIN EC 81 MG PO TBEC
81.0000 mg | DELAYED_RELEASE_TABLET | Freq: Every day | ORAL | Status: DC
  Administered 2020-03-07 – 2020-03-09 (×3): 81 mg via ORAL
  Filled 2020-03-06 (×3): qty 1

## 2020-03-06 MED ORDER — OXYCODONE HCL 5 MG PO TABS
5.0000 mg | ORAL_TABLET | ORAL | Status: DC | PRN
  Administered 2020-03-07 – 2020-03-08 (×2): 5 mg via ORAL
  Filled 2020-03-06 (×2): qty 1

## 2020-03-06 MED ORDER — ACETAMINOPHEN 325 MG PO TABS
325.0000 mg | ORAL_TABLET | Freq: Four times a day (QID) | ORAL | Status: DC | PRN
  Administered 2020-03-07 – 2020-03-09 (×3): 325 mg via ORAL
  Filled 2020-03-06 (×3): qty 1

## 2020-03-06 MED ORDER — SODIUM CHLORIDE FLUSH 0.9 % IV SOLN
INTRAVENOUS | Status: AC
  Filled 2020-03-06: qty 10

## 2020-03-06 MED ORDER — GLYCOPYRROLATE 0.2 MG/ML IJ SOLN
INTRAMUSCULAR | Status: DC | PRN
  Administered 2020-03-06: .2 mg via INTRAVENOUS

## 2020-03-06 MED ORDER — BUPIVACAINE HCL (PF) 0.5 % IJ SOLN
INTRAMUSCULAR | Status: AC
  Filled 2020-03-06: qty 30

## 2020-03-06 MED ORDER — KETOROLAC TROMETHAMINE 15 MG/ML IJ SOLN: 15.0000 mg | Freq: Once | INTRAMUSCULAR | Status: AC

## 2020-03-06 MED ORDER — ADULT MULTIVITAMIN W/MINERALS CH
ORAL_TABLET | Freq: Every day | ORAL | Status: DC
  Administered 2020-03-07 – 2020-03-09 (×3): 1 via ORAL
  Filled 2020-03-06 (×3): qty 1

## 2020-03-06 MED ORDER — SODIUM CHLORIDE 0.9 % IV SOLN: INTRAVENOUS | Status: DC

## 2020-03-06 SURGICAL SUPPLY — 53 items
BLADE SAGITTAL WIDE XTHICK NO (BLADE) ×3 IMPLANT
BLADE SURG SZ20 CARB STEEL (BLADE) ×3 IMPLANT
CANISTER SUCT 1200ML W/VALVE (MISCELLANEOUS) ×3 IMPLANT
CANISTER SUCT 3000ML PPV (MISCELLANEOUS) ×6 IMPLANT
CHLORAPREP W/TINT 26 (MISCELLANEOUS) ×3 IMPLANT
COVER WAND RF STERILE (DRAPES) ×3 IMPLANT
DRAPE 3/4 80X56 (DRAPES) ×3 IMPLANT
DRAPE INCISE IOBAN 66X60 STRL (DRAPES) ×3 IMPLANT
DRAPE SURG 17X11 SM STRL (DRAPES) ×6 IMPLANT
DRSG OPSITE POSTOP 4X10 (GAUZE/BANDAGES/DRESSINGS) ×3 IMPLANT
ELECT BLADE 6.5 EXT (BLADE) ×3 IMPLANT
ELECT CAUTERY BLADE 6.4 (BLADE) ×3 IMPLANT
GLOVE BIO SURGEON STRL SZ7.5 (GLOVE) ×12 IMPLANT
GLOVE BIO SURGEON STRL SZ8 (GLOVE) ×12 IMPLANT
GLOVE BIOGEL PI IND STRL 8 (GLOVE) ×1 IMPLANT
GLOVE BIOGEL PI INDICATOR 8 (GLOVE) ×2
GLOVE INDICATOR 8.0 STRL GRN (GLOVE) ×3 IMPLANT
GOWN STRL REUS W/ TWL LRG LVL3 (GOWN DISPOSABLE) ×1 IMPLANT
GOWN STRL REUS W/ TWL XL LVL3 (GOWN DISPOSABLE) ×1 IMPLANT
GOWN STRL REUS W/TWL LRG LVL3 (GOWN DISPOSABLE) ×2
GOWN STRL REUS W/TWL XL LVL3 (GOWN DISPOSABLE) ×2
HEAD CERAMIC BIOLOX 36 T1 +3 (Head) ×2 IMPLANT
HOOD PEEL AWAY FLYTE STAYCOOL (MISCELLANEOUS) ×9 IMPLANT
KIT TURNOVER KIT A (KITS) ×3 IMPLANT
LINER ACE G7 36 SZF HIGH WALL (Liner) ×2 IMPLANT
NDL FILTER BLUNT 18X1 1/2 (NEEDLE) ×1 IMPLANT
NDL SAFETY ECLIPSE 18X1.5 (NEEDLE) ×2 IMPLANT
NDL SPNL 20GX3.5 QUINCKE YW (NEEDLE) ×1 IMPLANT
NEEDLE FILTER BLUNT 18X 1/2SAF (NEEDLE) ×2
NEEDLE FILTER BLUNT 18X1 1/2 (NEEDLE) ×1 IMPLANT
NEEDLE HYPO 18GX1.5 SHARP (NEEDLE) ×4
NEEDLE SPNL 20GX3.5 QUINCKE YW (NEEDLE) ×3 IMPLANT
PACK HIP PROSTHESIS (MISCELLANEOUS) ×3 IMPLANT
PILLOW ABDUCTION FOAM SM (MISCELLANEOUS) ×3 IMPLANT
PIN STEINMAN 3/16 (PIN) ×3 IMPLANT
PULSAVAC PLUS IRRIG FAN TIP (DISPOSABLE) ×3
SHELL ACETABULAR 3H 54MM F (Shell) ×2 IMPLANT
SOL .9 NS 3000ML IRR  AL (IV SOLUTION) ×2
SOL .9 NS 3000ML IRR UROMATIC (IV SOLUTION) ×1 IMPLANT
SPONGE LAP 18X18 RF (DISPOSABLE) ×3 IMPLANT
STAPLER SKIN PROX 35W (STAPLE) ×3 IMPLANT
STEM FEMORAL FULL 15X155 ECHO (Stem) ×2 IMPLANT
SUT TICRON 2-0 30IN 311381 (SUTURE) ×9 IMPLANT
SUT VIC AB 0 CT1 36 (SUTURE) ×3 IMPLANT
SUT VIC AB 1 CT1 36 (SUTURE) ×3 IMPLANT
SUT VIC AB 2-0 CT1 (SUTURE) ×9 IMPLANT
SYR 10ML LL (SYRINGE) ×3 IMPLANT
SYR 20ML LL LF (SYRINGE) ×3 IMPLANT
SYR 30ML LL (SYRINGE) ×6 IMPLANT
TAPE TRANSPORE STRL 2 31045 (GAUZE/BANDAGES/DRESSINGS) ×3 IMPLANT
TIP FAN IRRIG PULSAVAC PLUS (DISPOSABLE) ×1 IMPLANT
TRAY FOLEY MTR SLVR 16FR STAT (SET/KITS/TRAYS/PACK) ×3 IMPLANT
WATER STERILE IRR 1000ML POUR (IV SOLUTION) ×3 IMPLANT

## 2020-03-06 NOTE — Op Note (Addendum)
03/06/2020  1:26 PM  Patient:   Nicholas Monroe  Pre-Op Diagnosis:   Severe degenerative joint disease, left hip.  Post-Op Diagnosis:   Same.  Procedure:   Left total hip arthroplasty.  Surgeon:   Pascal Lux, MD  Assistant:   Cameron Proud, PA-C  Anesthesia:   Spinal  Findings:   As above.  Complications:   None  EBL:   300 cc  Fluids:   1100 cc crystalloid  UOP:   None  TT:   None  Drains:   None  Closure:   Staples  Implants:   Biomet press-fit system with a #15 laterally offset Echo femoral stem, a 54 mm acetabular shell with an E-poly hi-wall liner, and a 36 mm ceramic head with a +3 mm neck.  Brief Clinical Note:   The patient is a 79 year old male with a history of progressively worsening anterior left hip/groin pain with radiation down his thigh to his knee. His symptoms have progressed despite medications, activity modification, etc. His history and examination are consistent with advanced degenerative joint disease of the left hip, confirmed by plain radiographs. The patient presents at this time for a left total hip arthroplasty.   Procedure:   The patient was brought into the operating room. After adequate spinal anesthesia was obtained, the patient was repositioned in the right lateral decubitus position and secured using a lateral hip positioner. The left hip and lower extremity were prepped with ChloroPrep solution before being draped sterilely. Preoperative antibiotics were administered. A timeout was performed to verify the appropriate surgical site before a standard posterior approach to the hip was made through an approximately 5-6 inch incision. The incision was carried down through the subcutaneous tissues to expose the gluteal fascia and proximal end of the iliotibial band. These structures were split the length of the incision and the Charnley self-retaining hip retractor placed. The bursal tissues were swept posteriorly to expose the short external  rotators. The anterior border of the piriformis tendon was identified and this plane developed down through the capsule to enter the joint. A flap of tissue was elevated off the posterior aspect of the femoral neck and greater trochanter and retracted posteriorly. This flap included the piriformis tendon, the short external rotators, and the posterior capsule. The soft tissues were elevated off the lateral aspect of the ilium and a large Steinmann pin placed bicortically. With the left leg aligned over the right, a drill bit was placed into the greater trochanter parallel to the Steinmann pin and the distance between these two pins measured in order to optimize leg lengths postoperatively. The drill bit was removed and the hip dislocated. The piriformis fossa was debrided of soft tissues before the intramedullary canal was accessed through this point using a triple step reamer. The canal was reamed sequentially beginning with a #7 tapered reamer and progressing to a #15 tapered reamer. This provided excellent circumferential chatter. Using the appropriate guide, a femoral neck cut was made 10-12 mm above the lesser trochanter. The femoral head was removed.  Attention was directed to the acetabular side. The labrum was debrided circumferentially before the ligamentum teres was removed using a large curette. A line was drawn on the drapes corresponding to the native version of the acetabulum. This line was used as a guide while the acetabulum was reamed sequentially beginning with a 45 mm reamer and progressing to a 53 mm reamer. This provided excellent circumferential chatter. The 53 mm trial acetabulum was positioned and  found to fit quite well. Therefore, the 54 mm acetabular shell was selected and impacted into place with care taken to maintain the appropriate version. The trial high wall liner was inserted.  Attention was redirected to the femoral side. A box osteotome was used to establish version before the  canal was broached sequentially beginning with a #10 broach and progressing to a #15 broach. This was left in place and several trial reductions performed using both a standard and laterally offset neck options, as well as the +0 mm, +3 mm, and +6 mm neck lengths. After removing the trial components, the "manhole cover" was placed into the apex of the acetabular shell and tightened securely. The permanent E-polyethylene hi-wall liner was impacted into the acetabular shell and its locking mechanism verified using a quarter-inch osteotome. Next, the #15 laterally offset femoral stem was impacted into place with care taken to maintain the appropriate version. A repeat trial reduction was performed using the +3 mm and +6 mm neck lengths. The +3 mm neck length demonstrated excellent stability both in extension and external rotation as well as with flexion to 90 and internal rotation beyond 70. It also was stable in the position of sleep. In addition, leg lengths appeared to be restored appropriately, both by reassessing the position of the right leg over the left, as well as by measuring the distance between the Steinmann pin and the drill bit. The 36 mm ceramic head with the +3 mm neck was impacted onto the stem of the femoral component. The Morse taper locking mechanism was verified using manual distraction before the head was relocated and placed through a range of motion with the findings as described above.  The wound was copiously irrigated with sterile saline solution via the jet lavage system before the peri-incisional and pericapsular tissues were injected with 30 cc of 0.5% Sensorcaine with epinephrine and 20 cc of Exparel diluted out to 60 cc with normal saline to help with postoperative analgesia. The posterior flap was reapproximated to the posterior aspect of the greater trochanter using #2 Tycron interrupted sutures placed through drill holes. Several additional #2 Tycron interrupted sutures were used  to reinforce this layer of closure. The iliotibial band was reapproximated using #1 Vicryl interrupted sutures before the gluteal fascia was closed using a running #1 Vicryl suture. At this point, 1 g of transexemic acid in 10 cc of normal saline was injected into the joint to help reduce postoperative bleeding. The subcutaneous tissues were closed in several layers using 2-0 Vicryl interrupted sutures before the skin was closed using staples. A sterile occlusive dressing was applied to the wound. The patient was then rolled back into the supine position on his/her hospital bed before being awakened and returned to the recovery room in satisfactory condition after tolerating the procedure well.

## 2020-03-06 NOTE — Progress Notes (Signed)
PT Cancellation Note  Patient Details Name: Marion Sollecito MRN: [id number] DOB: 01-Sep-1941   Cancelled Treatment:    Reason Eval/Treat Not Completed: Other (comment).  PT consult received.  Chart reviewed.  Upon PT entering pt's room, nursing present who reported pt still "numb" and pt reporting he was not able to move L LE much at all yet (pt s/p L THA with spinal anesthesia).  D/t pt not with adequate sensation and strength back for therapy participation, will hold PT at this time and re-attempt PT evaluation at a later date/time as medically appropriate.  Leitha Bleak, PT 03/06/20, 4:09 PM

## 2020-03-06 NOTE — H&P (Signed)
Paper H&P to be scanned into permanent record. H&P reviewed and patient re-examined. No changes. 

## 2020-03-06 NOTE — Anesthesia Preprocedure Evaluation (Signed)
Anesthesia Evaluation  Patient identified by MRN, date of birth, ID band Patient awake    Reviewed: Allergy & Precautions, H&P , NPO status , Patient's Chart, lab work & pertinent test results, reviewed documented beta blocker date and time   Airway Mallampati: II   Neck ROM: full    Dental  (+) Poor Dentition   Pulmonary neg pulmonary ROS,    Pulmonary exam normal        Cardiovascular Exercise Tolerance: Good hypertension, On Medications negative cardio ROS Normal cardiovascular exam Rhythm:regular Rate:Normal     Neuro/Psych  Neuromuscular disease negative psych ROS   GI/Hepatic negative GI ROS, Neg liver ROS,   Endo/Other  negative endocrine ROS  Renal/GU negative Renal ROS  negative genitourinary   Musculoskeletal  (+) Arthritis , Osteoarthritis,    Abdominal   Peds  Hematology negative hematology ROS (+)   Anesthesia Other Findings Past Medical History: No date: Arthritis No date: Hyperlipidemia No date: Hypertension 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 Past Surgical History: No date: APPENDECTOMY 01/2009: COLONOSCOPY     Comment:  normal- Dr Bary Castilla 11/03/2018: COLONOSCOPY WITH PROPOFOL; N/A     Comment:  Procedure: COLONOSCOPY WITH PROPOFOL;  Surgeon: Robert Bellow, MD;  Location: [facility] ENDOSCOPY;  Service:               Endoscopy;  Laterality: N/A; No date: EYE SURGERY; Bilateral     Comment:  cataracts No date: HERNIA REPAIR 07/06/2015: TOTAL HIP ARTHROPLASTY; Right     Comment:  Procedure: TOTAL HIP ARTHROPLASTY;  Surgeon: Corky Mull, MD;  Location: [facility] ORS;  Service: Orthopedics;                Laterality: Right;   Reproductive/Obstetrics negative OB ROS                             Anesthesia Physical  Anesthesia Plan  ASA: II  Anesthesia Plan: Spinal   Post-op Pain Management:    Induction:    PONV Risk Score and Plan:   Airway Management Planned: Nasal Cannula  Additional Equipment:   Intra-op Plan:   Post-operative Plan:   Informed Consent: I have reviewed the patients History and Physical, chart, labs and discussed the procedure including the risks, benefits and alternatives for the proposed anesthesia with the patient or authorized representative who has indicated his/her understanding and acceptance.     Dental Advisory Given  Plan Discussed with: CRNA  Anesthesia Plan Comments:         Anesthesia Quick Evaluation

## 2020-03-06 NOTE — Transfer of Care (Signed)
Immediate Anesthesia Transfer of Care Note  Patient: Nicholas Monroe  Procedure(s) Performed: TOTAL HIP ARTHROPLASTY (Left Hip)  Patient Location: PACU  Anesthesia Type:General  Level of Consciousness: drowsy  Airway & Oxygen Therapy: Patient Spontanous Breathing and Patient connected to face mask oxygen  Post-op Assessment: Report given to RN and Post -op Vital signs reviewed and stable  Post vital signs: Reviewed and stable  Last Vitals:  Vitals Value Taken Time  BP 129/117 03/06/20 1327  Temp 36.1 C 03/06/20 1324  Pulse 86 03/06/20 1327  Resp 22 03/06/20 1328  SpO2 100 % 03/06/20 1327  Vitals shown include unvalidated device data.  Last Pain:  Vitals:   03/06/20 0911  TempSrc: Tympanic  PainSc: 6       Patients Stated Pain Goal: 0 ([id number])  Complications: No apparent anesthesia complications

## 2020-03-07 LAB — HEMOGLOBIN AND HEMATOCRIT, BLOOD
HCT: 27.1 % — ABNORMAL LOW (ref 39.0–52.0)
Hemoglobin: 9.2 g/dL — ABNORMAL LOW (ref 13.0–17.0)

## 2020-03-07 LAB — BASIC METABOLIC PANEL
Anion gap: 6 (ref 5–15)
BUN: 19 mg/dL (ref 8–23)
CO2: 23 mmol/L (ref 22–32)
Calcium: 8.3 mg/dL — ABNORMAL LOW (ref 8.9–10.3)
Chloride: 103 mmol/L (ref 98–111)
Creatinine, Ser: 1.24 mg/dL (ref 0.61–1.24)
GFR calc Af Amer: >60 mL/min (ref >60)
GFR calc non Af Amer: 55 mL/min — ABNORMAL LOW (ref >60)
Glucose, Bld: 150 mg/dL — ABNORMAL HIGH (ref 70–99)
Potassium: 4.1 mmol/L (ref 3.5–5.1)
Sodium: 132 mmol/L — ABNORMAL LOW (ref 135–145)

## 2020-03-07 LAB — PREPARE RBC (CROSSMATCH)

## 2020-03-07 LAB — CBC
HCT: 22.8 % — ABNORMAL LOW (ref 39.0–52.0)
Hemoglobin: 7.7 g/dL — ABNORMAL LOW (ref 13.0–17.0)
MCH: 31.7 pg (ref 26.0–34.0)
MCHC: 33.8 g/dL (ref 30.0–36.0)
MCV: 93.8 fL (ref 80.0–100.0)
Platelets: 98 10*3/uL — ABNORMAL LOW (ref 150–400)
RBC: 2.43 MIL/uL — ABNORMAL LOW (ref 4.22–5.81)
RDW: 13.9 % (ref 11.5–15.5)
WBC: 7 10*3/uL (ref 4.0–10.5)
nRBC: 0 % (ref 0.0–0.2)

## 2020-03-07 MED ORDER — SODIUM CHLORIDE 0.9% IV SOLUTION: Freq: Once | INTRAVENOUS | Status: AC

## 2020-03-07 NOTE — TOC Benefit Eligibility Note (Signed)
Transition of Care [facility]) Benefit Eligibility Note    Patient Details  Name: Bradon Karman MRN: [id number] Date of Birth: 26-Nov-1941   Medication/Dose: LOVENOX  31 MG DAILY  X 14 DAYS  SYRINGES  Covered?: Yes  Tier: (TIER-1 DRUG)  Prescription Coverage Preferred Pharmacy: [facility] with Person/Company/Phone Number:: SAMANTHA  @ [employer] [phone] # [phone]  Co-Pay: $ 7.65  Prior Approval: No  Deductible: Unmet  Additional Notes: LOVENOX 40 MG DAILY   : NOT COVER , [place] # [phone]    Memory Argue Phone Number: 03/07/2020, 4:08 PM

## 2020-03-07 NOTE — Anesthesia Procedure Notes (Signed)
Spinal  Patient location during procedure: OR Start time: 03/06/2020 11:05 AM End time: 03/06/2020 11:11 AM Staffing Performed: resident/CRNA  Anesthesiologist: Alvin Critchley, MD Resident/CRNA: Lia Foyer, CRNA Preanesthetic Checklist Completed: patient identified, IV checked, site marked, risks and benefits discussed, surgical consent, monitors and equipment checked, pre-op evaluation and timeout performed Spinal Block Patient position: sitting Prep: DuraPrep Patient monitoring: heart rate, cardiac monitor, continuous pulse ox and blood pressure Approach: midline Location: L3-4 Injection technique: single-shot Needle Needle type: Sprotte  Needle gauge: 25 G Needle length: 9 cm Assessment Sensory level: T4

## 2020-03-07 NOTE — Progress Notes (Signed)
PT Cancellation Note  Patient Details Name: Nicholas Monroe MRN: [id number] DOB: May 07, 1941   Cancelled Treatment:    Reason Eval/Treat Not Completed: Other (comment).  Chart reviewed.  Upon PT arrival to pt's room, pt had just started eating his breakfast and pt reports plan for blood transfusion.  Per discussion with pt's nurse, plan to start blood transfusion soon.  Will re-attempt PT evaluation after blood transfusion is completed.  Leitha Bleak, PT 03/07/20, 8:58 AM

## 2020-03-07 NOTE — Anesthesia Postprocedure Evaluation (Signed)
Anesthesia Post Note  Patient: Nicholas Monroe  Procedure(s) Performed: TOTAL HIP ARTHROPLASTY (Left Hip)  Patient location during evaluation: Nursing Unit Anesthesia Type: Spinal Level of consciousness: oriented and awake and alert Pain management: pain level controlled Vital Signs Assessment: post-procedure vital signs reviewed and stable Respiratory status: spontaneous breathing and respiratory function stable Cardiovascular status: blood pressure returned to baseline and stable Postop Assessment: no headache, no backache, no apparent nausea or vomiting and patient able to bend at knees Anesthetic complications: no     Last Vitals:  Vitals:   03/07/20 0010 03/07/20 0419  BP: (!) 107/55 132/62  Pulse: 78 72  Resp: 17 17  Temp: 37 C 36.6 C  SpO2: 97% 99%    Last Pain:  Vitals:   03/07/20 0419  TempSrc: Oral  PainSc:                  Alison Stalling

## 2020-03-07 NOTE — TOC Initial Note (Signed)
Transition of Care [facility]) - Initial/Assessment Note    Patient Details  Name: Nicholas Monroe MRN: [id number] Date of Birth: 11/18/1941  Transition of Care [facility]) CM/SW Contact:    Su Hilt, RN Phone Number: 03/07/2020, 4:34 PM  Clinical Narrative:                 Met with the patient to discuss DC plan and needs He lives at home with his wife He has a RW, a BSC, and hand rails, He is set up with [facility] services I requested the lovenox price  He will not need additional DME He is up to date with his PCP He can afford his medications   Expected Discharge Plan: [place] Barriers to Discharge: Continued Medical Work up   Patient Goals and CMS Choice Patient states their goals for this hospitalization and ongoing recovery are:: go home   Choice offered to / list presented to : Patient  Expected Discharge Plan and Services Expected Discharge Plan: [place]   Discharge Planning Services: CM Consult   Living arrangements for the past 2 months: Single Family Home                 DME Arranged: N/A         HH Arranged: PT HH Agency: [facility] [employer]) Date [place]: 03/07/20 Time [place]: [phone] Representative spoke with at [place]: Helene Kelp  Prior Living Arrangements/Services Living arrangements for the past 2 months: [place] Lives with:: Spouse Patient language and need for interpreter reviewed:: Yes Do you feel safe going back to the place where you live?: Yes      Need for Family Participation in Patient Care: No (Comment) Care giver support system in place?: Yes (comment) Current home services: DME(RW, BSC, Rails, Shower seat) Criminal Activity/Legal Involvement Pertinent to Current Situation/Hospitalization: No - Comment as needed  Activities of Daily Living Home Assistive Devices/Equipment: Cane (specify quad or straight), Eyeglasses ADL Screening  (condition at time of admission) Patient's cognitive ability adequate to safely complete daily activities?: Yes Is the patient deaf or have difficulty hearing?: No Does the patient have difficulty seeing, even when wearing glasses/contacts?: No Does the patient have difficulty concentrating, remembering, or making decisions?: No Patient able to express need for assistance with ADLs?: Yes Does the patient have difficulty dressing or bathing?: No Independently performs ADLs?: Yes (appropriate for developmental age) Does the patient have difficulty walking or climbing stairs?: Yes Weakness of Legs: Left Weakness of Arms/Hands: None  Permission Sought/Granted   Permission granted to share information with : Yes, Verbal Permission Granted              Emotional Assessment Appearance:: Appears stated age Attitude/Demeanor/Rapport: Engaged Affect (typically observed): Appropriate Orientation: : Oriented to Self, Oriented to Place, Oriented to  Time, Oriented to Situation Alcohol / Substance Use: Not Applicable Psych Involvement: No (comment)  Admission diagnosis:  Status post total hip replacement, left [O97.353] Patient Active Problem List   Diagnosis Date Noted  . 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 Benign prostatic hyperplasia with nocturia 12/29/2019  . Status post total knee replacement using cement, left 10/20/2019  . Lumbar spondylosis 03/04/2019  . Primary osteoarthritis of left hip 03/04/2019  . Encounter for screening colonoscopy 10/28/2018  . Nocturia more than twice per night 12/30/2017  . Hyperlipidemia 05/15/2016  . Essential hypertension 05/15/2016  .  Closed right hip fracture ([place]) 07/05/2015   PCP:  Juline Patch, MD Pharmacy:   [facility], [address] [place] [address] Phone: [phone] Fax: [phone]     Social Determinants of Health (SDOH) Interventions    Readmission Risk Interventions No  flowsheet data found.

## 2020-03-07 NOTE — Discharge Instructions (Signed)

## 2020-03-07 NOTE — Progress Notes (Signed)
  Subjective: 1 Day Post-Op Procedure(s) (LRB): TOTAL HIP ARTHROPLASTY (Left) Patient reports pain as moderate.   Patient is well, and has had no acute complaints or problems Plan is to go Home after hospital stay. Negative for chest pain and shortness of breath Fever: no Gastrointestinal: Negative for nausea and vomiting  Objective: Vital signs in last 24 hours: Temp:  [96 F (35.6 C)-98.6 F (37 C)] 97.8 F (36.6 C) (03/24 0419) Pulse Rate:  [56-86] 72 (03/24 0419) Resp:  [12-25] 17 (03/24 0419) BP: (73-142)/(45-108) 132/62 (03/24 0419) SpO2:  [96 %-100 %] 99 % (03/24 0419) Weight:  [81.6 kg] 81.6 kg (03/23 0911)  Intake/Output from previous day:  Intake/Output Summary (Last 24 hours) at 03/07/2020 [id number] Last data filed at 03/07/2020 0400 Gross per 24 hour  Intake 2828.89 ml  Output 1100 ml  Net 1728.89 ml    Intake/Output this shift: No intake/output data recorded.  Labs: Recent Labs    03/07/20 0601  HGB 7.7*   Recent Labs    03/07/20 0601  WBC 7.0  RBC 2.43*  HCT 22.8*  PLT 98*   Recent Labs    03/07/20 0601  NA 132*  K 4.1  CL 103  CO2 23  BUN 19  CREATININE 1.24  GLUCOSE 150*  CALCIUM 8.3*   No results for input(s): LABPT, INR in the last 72 hours.   EXAM General - Patient is Alert and Oriented Extremity - Neurovascular intact Sensation intact distally Dorsiflexion/Plantar flexion intact Compartment soft Dressing/Incision - clean, dry, blood tinged and scant drainage Motor Function - intact, moving foot and toes well on exam.   Past Medical History:  Diagnosis Date  . Arthritis   . Hyperlipidemia   . Hypertension   . Ocular myasthenia gravis ([place])    double vision    Assessment/Plan: 1 Day Post-Op Procedure(s) (LRB): TOTAL HIP ARTHROPLASTY (Left) Active Problems:   Status post total hip replacement, left  Estimated body mass index is 25.83 kg/m as calculated from the following:   Height as of this encounter: 5\' 10"  (1.778  m).   Weight as of this encounter: 81.6 kg. Advance diet Up with therapy Discharge home with home health possible tomorrow. Acute blood loss anemia.  Hemoglobin 7.7 after surgery.  Transfuse 1 unit packed red blood cells with recheck labs after transfusion.  DVT Prophylaxis - Lovenox, Foot Pumps and TED hose Weight-Bearing as tolerated to left leg  Reche Dixon, PA-C Orthopaedic Surgery 03/07/2020, 7:12 AM

## 2020-03-07 NOTE — Addendum Note (Signed)
Addendum  created 03/07/20 0759 by Lia Foyer, CRNA   Child order released for a procedure order, Clinical Note Signed, Intraprocedure Blocks edited

## 2020-03-07 NOTE — Evaluation (Signed)
Physical Therapy Evaluation Patient Details Name: Nicholas Monroe MRN: [id number] DOB: [birth year] Today's Date: 03/07/2020   History of Present Illness  Pt is a 79 y.o. 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  PMH includes L TKA, htn, OA, ocular myasthenia gravis (double vision), and R THA.  Clinical Impression  Prior to hospital admission, pt was ambulatory; lives with his wife in 1 level home with 4 STE with L railing.  Currently pt is min assist semi-supine to sitting edge of bed; CGA with transfers; and CGA with ambulating 80 feet with RW.  Pain 5/10 L hip beginning of session and 6/10 end of session at rest (nurse notified).  Pt requiring fairly consistent vc's for posterior hip precautions with all activities (pt's wife able to assist pt with remembering precautions).  Pt would benefit from skilled PT to address noted impairments and functional limitations (see below for any additional details).  Upon hospital discharge, pt would benefit from HHPT and support of family.    Follow Up Recommendations Home health PT;Supervision for mobility/OOB    Equipment Recommendations  Rolling walker with 5" wheels;3in1 (PT)    Recommendations for Other Services OT consult     Precautions / Restrictions Precautions Precautions: Posterior Hip;Fall Precaution Booklet Issued: Yes (comment) Restrictions Weight Bearing Restrictions: Yes LLE Weight Bearing: Weight bearing as tolerated      Mobility  Bed Mobility Overal bed mobility: Needs Assistance Bed Mobility: Supine to Sit     Supine to sit: Min assist;HOB elevated     General bed mobility comments: assist for L LE and posterior hip precautions; vc's for overall technique  Transfers Overall transfer level: Needs assistance Equipment used: Rolling walker (2 wheeled) Transfers: Sit to/from Stand Sit to Stand: Min guard         General transfer comment: sit to stand from mildly elevated bed and from recliner; vc's for  UE/LE placement and for posterior hip precautions  Ambulation/Gait Ambulation/Gait assistance: Min guard Gait Distance (Feet): 80 Feet Assistive device: Rolling walker (2 wheeled) Gait Pattern/deviations: Step-to pattern;Antalgic Gait velocity: decreased   General Gait Details: vc's for gait technique, walker use, and for upright posture when ambulating  Stairs            Wheelchair Mobility    Modified Rankin (Stroke Patients Only)       Balance Overall balance assessment: Needs assistance Sitting-balance support: No upper extremity supported;Feet supported Sitting balance-Leahy Scale: Good Sitting balance - Comments: steady sitting reaching within BOS   Standing balance support: Single extremity supported Standing balance-Leahy Scale: Poor Standing balance comment: pt requiring at least single UE support for static standing balance                             Pertinent Vitals/Pain Pain Assessment: 0-10 Pain Score: 6  Pain Location: L hip/thigh Pain Descriptors / Indicators: Discomfort Pain Intervention(s): Limited activity within patient's tolerance;Monitored during session;Premedicated before session;Repositioned;Other (comment)(RN notified of pt's pain level and need for new ice for ice pack)  Vitals (HR and O2 on room air) stable and WFL throughout treatment session.    Home Living Family/patient expects to be discharged to:: Private residence Living Arrangements: Spouse/significant other Available Help at Discharge: Available 24 hours/day Type of Home: House Home Access: Stairs to enter Entrance Stairs-Rails: Left Entrance Stairs-Number of Steps: 4 Home Layout: One level Home Equipment: Walker - 2 wheels      Prior Function  Level of Independence: Independent         Comments: Pt reports no recent falls     Hand Dominance        Extremity/Trunk Assessment   Upper Extremity Assessment Upper Extremity Assessment: Overall WFL for  tasks assessed    Lower Extremity Assessment Lower Extremity Assessment: LLE deficits/detail LLE Deficits / Details: good L quad set; at least 3-/5 hip flexion and 3/5 knee flexion/extension and DF/PF LLE: Unable to fully assess due to pain    Cervical / Trunk Assessment Cervical / Trunk Assessment: Normal  Communication   Communication: HOH  Cognition Arousal/Alertness: Awake/alert Behavior During Therapy: WFL for tasks assessed/performed Overall Cognitive Status: Within Functional Limits for tasks assessed                                        General Comments General comments (skin integrity, edema, etc.): minimal red drainage noted L hip honeycomb dressing.  Pt agreeable to PT session.  Pt's wife present during session.    Exercises Total Joint Exercises Ankle Circles/Pumps: AROM;Strengthening;Both;10 reps;Supine Quad Sets: AROM;Strengthening;Both;10 reps;Supine Gluteal Sets: AROM;Strengthening;Both;10 reps;Supine Towel Squeeze: AROM;Strengthening;Both;10 reps;Supine Short Arc Quad: AAROM;Strengthening;Left;10 reps;Supine Heel Slides: AAROM;Strengthening;Left;10 reps;Supine Hip ABduction/ADduction: AAROM;Strengthening;Left;10 reps;Supine   Assessment/Plan    PT Assessment Patient needs continued PT services  PT Problem List Decreased strength;Decreased activity tolerance;Decreased balance;Decreased mobility;Decreased knowledge of use of DME;Decreased knowledge of precautions;Pain;Decreased skin integrity       PT Treatment Interventions DME instruction;Gait training;Stair training;Functional mobility training;Therapeutic activities;Therapeutic exercise;Balance training;Patient/family education    PT Goals (Current goals can be found in the Care Plan section)  Acute Rehab PT Goals Patient Stated Goal: to improve pain and mobility PT Goal Formulation: With patient/family Time For Goal Achievement: 03/21/20 Potential to Achieve Goals: Good     Frequency BID   Barriers to discharge        Co-evaluation               AM-PAC PT "6 Clicks" Mobility  Outcome Measure Help needed turning from your back to your side while in a flat bed without using bedrails?: A Little Help needed moving from lying on your back to sitting on the side of a flat bed without using bedrails?: A Little Help needed moving to and from a bed to a chair (including a wheelchair)?: A Little Help needed standing up from a chair using your arms (e.g., wheelchair or bedside chair)?: A Little Help needed to walk in hospital room?: A Little Help needed climbing 3-5 steps with a railing? : A Lot 6 Click Score: 17    End of Session Equipment Utilized During Treatment: Gait belt Activity Tolerance: Patient tolerated treatment well Patient left: in chair;with call bell/phone within reach;with chair alarm set;with family/visitor present;with SCD's reapplied;Other (comment)(pillows between pt's knees and to elevate B heels) Nurse Communication: Mobility status;Precautions;Other (comment)(Pt's pain status and request for ice pack) PT Visit Diagnosis: Other abnormalities of gait and mobility (R26.89);Muscle weakness (generalized) (M62.81);History of falling (Z91.81);Difficulty in walking, not elsewhere classified (R26.2);Pain Pain - Right/Left: Left Pain - part of body: Hip    Time: [id number] PT Time Calculation (min) (ACUTE ONLY): 52 min   Charges:   PT Evaluation $PT Eval Low Complexity: 1 Low PT Treatments $Therapeutic Exercise: 8-22 mins $Therapeutic Activity: 8-22 mins        Leitha Bleak, PT 03/07/20, 4:56 PM

## 2020-03-08 LAB — BPAM RBC
Blood Product Expiration Date: 202104192359
ISSUE DATE / TIME: 202103240943
Unit Type and Rh: 6200

## 2020-03-08 LAB — BASIC METABOLIC PANEL
Anion gap: 5 (ref 5–15)
BUN: 14 mg/dL (ref 8–23)
CO2: 25 mmol/L (ref 22–32)
Calcium: 8.6 mg/dL — ABNORMAL LOW (ref 8.9–10.3)
Chloride: 103 mmol/L (ref 98–111)
Creatinine, Ser: 1.03 mg/dL (ref 0.61–1.24)
GFR calc Af Amer: >60 mL/min (ref >60)
GFR calc non Af Amer: >60 mL/min (ref >60)
Glucose, Bld: 120 mg/dL — ABNORMAL HIGH (ref 70–99)
Potassium: 4.1 mmol/L (ref 3.5–5.1)
Sodium: 133 mmol/L — ABNORMAL LOW (ref 135–145)

## 2020-03-08 LAB — TYPE AND SCREEN
ABO/RH(D): A POS
Antibody Screen: NEGATIVE
Unit division: 0

## 2020-03-08 LAB — CBC
HCT: 23.5 % — ABNORMAL LOW (ref 39.0–52.0)
Hemoglobin: 8 g/dL — ABNORMAL LOW (ref 13.0–17.0)
MCH: 31.1 pg (ref 26.0–34.0)
MCHC: 34 g/dL (ref 30.0–36.0)
MCV: 91.4 fL (ref 80.0–100.0)
Platelets: 104 10*3/uL — ABNORMAL LOW (ref 150–400)
RBC: 2.57 MIL/uL — ABNORMAL LOW (ref 4.22–5.81)
RDW: 14.8 % (ref 11.5–15.5)
WBC: 8.7 10*3/uL (ref 4.0–10.5)
nRBC: 0 % (ref 0.0–0.2)

## 2020-03-08 LAB — SURGICAL PATHOLOGY

## 2020-03-08 LAB — HEMOGLOBIN: Hemoglobin: 8 g/dL — ABNORMAL LOW (ref 13.0–17.0)

## 2020-03-08 MED ORDER — TRAMADOL HCL 50 MG PO TABS: 50.0000 mg | ORAL_TABLET | Freq: Four times a day (QID) | ORAL | 0 refills | Status: DC | PRN

## 2020-03-08 MED ORDER — OXYCODONE HCL 5 MG PO TABS: 5.0000 mg | ORAL_TABLET | ORAL | 0 refills | Status: DC | PRN

## 2020-03-08 MED ORDER — ENOXAPARIN SODIUM 40 MG/0.4ML ~~LOC~~ SOLN: 40.0000 mg | SUBCUTANEOUS | 0 refills | Status: DC

## 2020-03-08 NOTE — Evaluation (Signed)
Occupational Therapy Evaluation Patient Details Name: Nicholas Monroe MRN: [id number] DOB: 1941/07/02 Today's Date: 03/08/2020    History of Present Illness Pt is a 79 y.o. 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  PMH includes L TKA, htn, OA, ocular myasthenia gravis (double vision), and R THA.   Clinical Impression   Nicholas Monroe was seen for OT evaluation this date, POD#2 from above surgery. Pt was modified independent for ADLs prior to surgery. He reports using either a SPC or, more recently, a 2WW for all functional mobility 2/2 L hip pain. Pt is eager to return to PLOF with less pain and improved safety and independence. Pt currently requires minimal assist for LB dressing while in seated position due to pain and limited AROM of L hip. Pt able to recall 2/3 posterior total hip precautions at start of session and unable to verbalize how to implement during ADL and mobility. Pt and caregiver (wife present throughout evaluation) instructed in posterior total hip precautions and how to implement, self care skills, falls prevention strategies, home/routines modifications, & DME/AE for LB bathing and dressing tasks. Pt would benefit from additional instruction in self care skills and techniques to help maintain precautions with or without assistive devices to support recall and carryover prior to discharge. Recommend HHOT upon discharge.       Follow Up Recommendations  Home health OT    Equipment Recommendations  Other (comment)(LH reacher, Sock Aid, [name])    Recommendations for Other Services       Precautions / Restrictions Precautions Precautions: Posterior Hip;Fall Precaution Booklet Issued: Yes (comment) Restrictions Weight Bearing Restrictions: Yes LLE Weight Bearing: Weight bearing as tolerated      Mobility Bed Mobility Overal bed mobility: Needs Assistance             General bed mobility comments: Deferred (pt sitting in recliner at beginning and end  of session)  Transfers Overall transfer level: Needs assistance Equipment used: Rolling walker (2 wheeled) Transfers: Sit to/from Stand Sit to Stand: Min guard         General transfer comment: x2 trials standing from recliner with focus on maintaining posterior hip precautions; vc's for scooting forward towards edge of chair prior to standing and UE/LE placement    Balance Overall balance assessment: Needs assistance Sitting-balance support: No upper extremity supported;Feet supported Sitting balance-Leahy Scale: Good Sitting balance - Comments: steady sitting reaching within BOS   Standing balance support: No upper extremity supported Standing balance-Leahy Scale: Fair Standing balance comment: steady static standing no UE support                           ADL either performed or assessed with clinical judgement   ADL Overall ADL's : Needs assistance/impaired                                       General ADL Comments: Pt is functionally limited by pain, decreased AROM of his L hip, and posterior THPs s/p sx. He requires minimal to moderate assistance for LB ADL management. Set-up assist for grooming/UB dressing tasks and is independent for self-feeding.     Vision Baseline Vision/History: Wears glasses Wears Glasses: Reading only Patient Visual Report: No change from baseline Additional Comments: Pt with hx of myasthenia gravis which he reports causes vertical split double vision. He endorses  he usually takes medication which helps this but had to stop taking it for this sx. So far, double vision has not returned. Will continue to monitor.     Perception     Praxis      Pertinent Vitals/Pain Pain Assessment: 0-10 Pain Score: 8  Pain Location: L hip/thigh with mobility. Denies pain at rest. Pain Descriptors / Indicators: Discomfort;Sore Pain Intervention(s): Limited activity within patient's tolerance;Monitored during session     Hand  Dominance Right   Extremity/Trunk Assessment Upper Extremity Assessment Upper Extremity Assessment: Overall WFL for tasks assessed   Lower Extremity Assessment Lower Extremity Assessment: LLE deficits/detail;Defer to PT evaluation LLE Deficits / Details: s/p L THA (posterior approach with posterior THP's) LLE Coordination: decreased gross motor   Cervical / Trunk Assessment Cervical / Trunk Assessment: Normal   Communication Communication Communication: HOH   Cognition Arousal/Alertness: Awake/alert Behavior During Therapy: WFL for tasks assessed/performed Overall Cognitive Status: Within Functional Limits for tasks assessed                                 General Comments: Pt pleasant, conversational, agreeable to OT eval.   General Comments       Exercises General Exercises - Lower Extremity Ankle Circles/Pumps: AROM;Strengthening;Both;10 reps;Seated Long Arc Quad: AROM;Strengthening;Both;10 reps;Seated Other Exercises Other Exercises: Pt and caregiver (spouse) educated on falls prevention strategies, safe use of AE/DME for ADL management in consideration of posterior THPs, posterior THPs, and routines modifications to support safety and fxl independence upon hospital DC. Treatment limited 2/2 pt lunch tray arriving during evaluation. Pt would benefit from further opportunity to trial AE for LB ADL management.   Shoulder Instructions      Home Living Family/patient expects to be discharged to:: Private residence Living Arrangements: Spouse/significant other Available Help at Discharge: Available 24 hours/day Type of Home: House Home Access: Stairs to enter [employer] of Steps: 4 Entrance Stairs-Rails: Left Home Layout: One level     Bathroom Shower/Tub: Tub/shower unit;Walk-in shower   Bathroom Toilet: Standard     Home Equipment: Walker - 2 wheels;Hand held shower head;Adaptive equipment;Grab bars - tub/shower;Cane - single  point Adaptive Equipment: Long-handled sponge        Prior Functioning/Environment Level of Independence: Independent with assistive device(s)        Comments: Pt independent with ADL/IADL, using a 2WW or SPC for fxl mobility 2/2 hip pain, no recent falls hx.        OT Problem List: Decreased strength;Pain;Decreased coordination;Decreased activity tolerance;Decreased safety awareness;Decreased knowledge of use of DME or AE;Impaired balance (sitting and/or standing);Decreased knowledge of precautions      OT Treatment/Interventions: Self-care/ADL training;Therapeutic exercise;Therapeutic activities;DME and/or AE instruction;Patient/family education;Balance training    OT Goals(Current goals can be found in the care plan section) Acute Rehab OT Goals Patient Stated Goal: to improve pain and mobility OT Goal Formulation: With patient Time For Goal Achievement: 03/22/20 Potential to Achieve Goals: Good ADL Goals Pt Will Perform Lower Body Dressing: sit to/from stand;with set-up;with supervision;with caregiver independent in assisting;with adaptive equipment(With LRAD PRN for improved safety, functional independence, and adherence to posterior THPs) Pt Will Transfer to Toilet: bedside commode;with modified independence(With LRAD PRN for improved safety, functional independence, and adherence to posterior THPs) Pt Will Perform Toileting - Clothing Manipulation and hygiene: sit to/from stand;with set-up;with supervision;with caregiver independent in assisting;with adaptive equipment(With LRAD PRN for improved safety, functional independence, and adherence to posterior THPs)  OT  Frequency: Min 2X/week   Barriers to D/C: Inaccessible home environment  Pt with 4 STE the home       Co-evaluation              AM-PAC OT "6 Clicks" Daily Activity     Outcome Measure Help from another person eating meals?: None Help from another person taking care of personal grooming?: A Little Help  from another person toileting, which includes using toliet, bedpan, or urinal?: A Little Help from another person bathing (including washing, rinsing, drying)?: A Little Help from another person to put on and taking off regular upper body clothing?: A Little Help from another person to put on and taking off regular lower body clothing?: A Little 6 Click Score: 19   End of Session    Activity Tolerance: Patient tolerated treatment well Patient left: in chair;with call bell/phone within reach;with chair alarm set;with family/visitor present;with SCD's reapplied  OT Visit Diagnosis: Other abnormalities of gait and mobility (R26.89);Pain Pain - Right/Left: Left Pain - part of body: Hip                Time: [id number] OT Time Calculation (min): 17 min Charges:  OT General Charges $OT Visit: 1 Visit OT Evaluation $OT Eval Moderate Complexity: 1 Mod OT Treatments $Self Care/Home Management : 8-22 mins  Shara Blazing, M.S., OTR/L Ascom: [phone] 03/08/20, 1:13 PM

## 2020-03-08 NOTE — Progress Notes (Signed)
Pt up to chair with use of walker x 1 assist

## 2020-03-08 NOTE — Progress Notes (Signed)
Subjective: 2 Days Post-Op Procedure(s) (LRB): TOTAL HIP ARTHROPLASTY (Left) Patient reports pain as mild.   Patient is well, and has had no acute complaints or problems Plan is to go Home after hospital stay. Negative for chest pain and shortness of breath Fever: no Gastrointestinal: Negative for nausea and vomiting  Objective: Vital signs in last 24 hours: Temp:  [98.1 F (36.7 C)-98.6 F (37 C)] 98.1 F (36.7 C) (03/25 1613) Pulse Rate:  [63-88] 72 (03/25 1613) Resp:  [17-20] 18 (03/25 1613) BP: (92-156)/(56-80) 125/62 (03/25 1613) SpO2:  [98 %-100 %] 100 % (03/25 1613)  Intake/Output from previous day:  Intake/Output Summary (Last 24 hours) at 03/08/2020 1621 Last data filed at 03/08/2020 0953 Gross per 24 hour  Intake --  Output 400 ml  Net -400 ml    Intake/Output this shift: Total I/O In: -  Out: 200 [Urine:200]  Labs: Recent Labs    03/07/20 0601 03/07/20 1614 03/08/20 0532 03/08/20 1218  HGB 7.7* 9.2* 8.0* 8.0*   Recent Labs    03/07/20 0601 03/07/20 0601 03/07/20 1614 03/08/20 0532  WBC 7.0  --   --  8.7  RBC 2.43*  --   --  2.57*  HCT 22.8*   < > 27.1* 23.5*  PLT 98*  --   --  104*   < > = values in this interval not displayed.   Recent Labs    03/07/20 0601 03/08/20 0532  NA 132* 133*  K 4.1 4.1  CL 103 103  CO2 23 25  BUN 19 14  CREATININE 1.24 1.03  GLUCOSE 150* 120*  CALCIUM 8.3* 8.6*   No results for input(s): LABPT, INR in the last 72 hours.   EXAM General - Patient is Alert and Oriented Extremity - Neurovascular intact Sensation intact distally Dorsiflexion/Plantar flexion intact Compartment soft Dressing/Incision - clean, dry, blood tinged and scant drainage Motor Function - intact, moving foot and toes well on exam.   Past Medical History:  Diagnosis Date  . Arthritis   . Hyperlipidemia   . Hypertension   . Ocular myasthenia gravis ([place])    double vision    Assessment/Plan: 2 Days Post-Op Procedure(s)  (LRB): TOTAL HIP ARTHROPLASTY (Left) Active Problems:   Status post total hip replacement, left  Estimated body mass index is 25.83 kg/m as calculated from the following:   Height as of this encounter: 5\' 10"  (1.778 m).   Weight as of this encounter: 81.6 kg. Advance diet Up with therapy   Labs reviewed. Hg 8.0, received transfusion yesterday, 1 unit of PRBC.  Hg 9.0 after transfusion.  Repeat Hg showed Hg is stable at 8.0 Continue with PT today. Continue working on BM. Will discuss possible d/c home today with patient. If does not d/c home today, will plan for tomorrow.  DVT Prophylaxis - Lovenox, Foot Pumps and TED hose Weight-Bearing as tolerated to left leg  J. Cameron Proud, PA-C Orthopaedic Surgery 03/08/2020, 4:21 PM

## 2020-03-08 NOTE — Discharge Summary (Signed)
Physician Discharge Summary  Patient ID: Nicholas Monroe MRN: [id number] DOB/AGE: February 19, 1941 79 y.o.  Admit date: 03/06/2020 Discharge date: 03/09/20  Admission Diagnoses:  Status post total hip replacement, left [Z96.642]  Discharge Diagnoses: Patient Active Problem List   Diagnosis Date Noted  . 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 Benign prostatic hyperplasia with nocturia 12/29/2019  . Status post total knee replacement using cement, left 10/20/2019  . Lumbar spondylosis 03/04/2019  . Primary osteoarthritis of left hip 03/04/2019  . Encounter for screening colonoscopy 10/28/2018  . Nocturia more than twice per night 12/30/2017  . Hyperlipidemia 05/15/2016  . Essential hypertension 05/15/2016  . Closed right hip fracture ([place]) 07/05/2015   Past Medical History:  Diagnosis Date  . Arthritis   . Hyperlipidemia   . Hypertension   . Ocular myasthenia gravis ([place])    double vision     Transfusion: 1 unit of PRBC on 03/07/20   Consultants (if any):   Discharged Condition: Improved  Hospital Course: Nicholas Monroe is an 79 y.o. male who was admitted 03/06/2020 with a diagnosis of primary osteoarthritis of the right hip and went to the operating room on 03/06/2020 and underwent the above named procedures.    Surgeries: Procedure(s): TOTAL HIP ARTHROPLASTY on 03/06/2020 Patient tolerated the surgery well. Taken to PACU where she was stabilized and then transferred to the orthopedic floor.  Started on Lovenox 40mg  q 24 hrs. Foot pumps applied bilaterally at 80 mm. Heels elevated on bed with rolled towels. No evidence of DVT. Negative [place]. Physical therapy started on day #1 for gait training and transfer. OT started day #1 for ADL and assisted devices.  On POD1 the Patient's Hg was 7.0, one unit of PRBC was transfused.  Post-transfusion Hg was 9.2.  CBC on 03/08/20 Hg was 8.0.  CBC on 03/09/20 Hg was 7.5 however he was able able to work with PT without any  symptoms.  Sent patient home with oral iron supplementation.  Patient's IV was d/c on POD3.  Implants: Biomet press-fit system with a #15 laterally offset Echo femoral stem, a 54 mm acetabular shell with an E-poly hi-wall liner, and a 36 mm ceramic head with a +3 mm neck.  He was given perioperative antibiotics:  Anti-infectives (From admission, onward)   Start     Dose/Rate Route Frequency Ordered Stop   03/06/20 1800  ceFAZolin (ANCEF) IVPB 2g/100 mL premix     2 g 200 mL/hr over 30 Minutes Intravenous Every 6 hours 03/06/20 1523 03/07/20 1410   03/06/20 0845  ceFAZolin (ANCEF) IVPB 2g/100 mL premix     2 g 200 mL/hr over 30 Minutes Intravenous On call to O.R. 03/06/20 [id number] 03/06/20 1122   03/06/20 0843  ceFAZolin (ANCEF) 2-4 GM/100ML-% IVPB    Note to Pharmacy: Ronnell Freshwater   : cabinet override      03/06/20 0843 03/06/20 1125    .  He was given sequential compression devices, early ambulation, and Lovenox for DVT prophylaxis.  He benefited maximally from the hospital stay and there were no complications.    Recent vital signs:  Vitals:   03/09/20 0401 03/09/20 0821  BP: 120/66 125/67  Pulse: 86 75  Resp: 17 18  Temp: 99.2 F (37.3 C) 98.4 F (36.9 C)  SpO2: 97% 98%    Recent laboratory studies:  Lab Results  Component Value Date   HGB 7.5 (L) 03/09/2020   HGB 8.0 (L) 03/08/2020   HGB 8.0 (L) 03/08/2020  Lab Results  Component Value Date   WBC 6.0 03/09/2020   PLT 109 (L) 03/09/2020   No results found for: INR Lab Results  Component Value Date   NA 135 03/09/2020   K 3.9 03/09/2020   CL 105 03/09/2020   CO2 25 03/09/2020   BUN 13 03/09/2020   CREATININE 0.99 03/09/2020   GLUCOSE 118 (H) 03/09/2020    Discharge Medications:   Allergies as of 03/09/2020   No Known Allergies     Medication List    STOP taking these medications   aspirin EC 81 MG tablet     TAKE these medications   acetaminophen 500 MG tablet Commonly known as: TYLENOL Take  1,000 mg by mouth every 8 (eight) hours as needed for moderate pain.   atenolol 50 MG tablet Commonly known as: TENORMIN Take 1 tablet (50 mg total) by mouth 2 (two) times daily.   CENTRUM SILVER PO Take 1 tablet by mouth daily.   enoxaparin 40 MG/0.4ML injection Commonly known as: LOVENOX Inject 0.4 mLs (40 mg total) into the skin daily.   fenofibrate 145 MG tablet Commonly known as: TRICOR Take 0.5 tablets (72.5 mg total) by mouth at bedtime.   ferrous [id number] C-folic acid capsule Commonly known as: TRINSICON / FOLTRIN Take 1 capsule by mouth 2 (two) times daily after a meal.   Fish Oil 1000 MG Caps Take 2,000 mg by mouth in the morning and at bedtime.   folic acid 1 MG tablet Commonly known as: FOLVITE Take 1 mg by mouth daily.   lisinopril 10 MG tablet Commonly known as: ZESTRIL Take 1 tablet (10 mg total) by mouth daily.   melatonin 3 MG Tabs tablet Take 3 mg by mouth at bedtime.   methotrexate 2.5 MG tablet Commonly known as: RHEUMATREX Take 15 mg by mouth every Saturday.   omega-3 acid ethyl esters 1 g capsule Commonly known as: LOVAZA TAKE (2) CAPSULES BY MOUTH TWICE DAILY   oxyCODONE 5 MG immediate release tablet Commonly known as: Oxy IR/ROXICODONE Take 1-2 tablets (5-10 mg total) by mouth every 4 (four) hours as needed for moderate pain (pain score 4-6).   tamsulosin 0.4 MG Caps capsule Commonly known as: FLOMAX Take 1 capsule (0.4 mg total) by mouth daily.   traMADol 50 MG tablet Commonly known as: ULTRAM Take 1 tablet (50 mg total) by mouth every 6 (six) hours as needed for moderate pain. What changed: how much to take            Durable Medical Equipment  (From admission, onward)         Start     Ordered   03/06/20 1524  DME Bedside commode  Once    Question:  Patient needs a bedside commode to treat with the following condition  Answer:  Status post total hip replacement, left   03/06/20 1523   03/06/20 1524  DME 3 n 1   Once     03/06/20 1523   03/06/20 1524  DME Walker rolling  Once    Question Answer Comment  Walker: With 5 Inch Wheels   Patient needs a walker to treat with the following condition Status post total hip replacement, left      03/06/20 1523         Diagnostic Studies: DG HIP UNILAT W OR W/O PELVIS 2-3 VIEWS LEFT  Result Date: 03/06/2020 CLINICAL DATA:  Hip replacement EXAM: DG HIP (WITH OR WITHOUT PELVIS) 2-3V LEFT COMPARISON:  Pelvis  07/06/2015 FINDINGS: Recent left hip replacement with gas in the soft tissues and skin staples on the left. Prosthesis in normal alignment. No fracture or complication Chronic right hip replacement without complication IMPRESSION: Satisfactory left hip replacement. Electronically Signed   By: Franchot Gallo M.D.   On: 03/06/2020 14:22   Disposition: Plan for discharge home on 03/09/20  Follow-up Information    Poggi, Marshall Cork, MD. Go in 2 week(s).   Specialty: Orthopedic Surgery Contact information: [place] [address] [phone]          Signed: Judson Roch PA-C 03/09/2020, 3:45 PM

## 2020-03-08 NOTE — Progress Notes (Signed)
Physical Therapy Treatment Patient Details Name: Nicholas Monroe MRN: [id number] DOB: 09-Oct-1941 Today's Date: 03/08/2020    History of Present Illness Pt is a 79 y.o. 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  PMH includes L TKA, htn, OA, ocular myasthenia gravis (double vision), and R THA.    PT Comments    Pt was seated in recliner upon arriving. Spouse at bedside and very supportive. He was able to recall 2/3 hip precautions. Therapist educated pt on importance of adhering and concerns if not. He was able to stand from recliner to RW with CGA for safety + use of gait belt. Ambulated 125 ft without LOB or unsteadiness. He also demonstrated safe ability to ascend/descend 4 stair with +1 rail and CGA to simulate home entry. Pt was seated in recliner post session with spouse present and RN aware of his abilities. Overall pt is progressing well with PT. Recommending home with HHPT when medically stable to address deficits and assist pt with returning to PLOF.       Follow Up Recommendations  Home health PT;Supervision for mobility/OOB     Equipment Recommendations  Rolling walker with 5" wheels;3in1 (PT)    Recommendations for Other Services OT consult     Precautions / Restrictions Precautions Precautions: Posterior Hip;Fall Precaution Booklet Issued: Yes (comment) Restrictions Weight Bearing Restrictions: Yes LLE Weight Bearing: Weight bearing as tolerated    Mobility  Bed Mobility Overal bed mobility: Needs Assistance             General bed mobility comments: pt was seated in recliner pre/post PT session  Transfers Overall transfer level: Needs assistance Equipment used: Rolling walker (2 wheeled) Transfers: Sit to/from Stand Sit to Stand: Min guard         General transfer comment: Vcs for reminders on precautions and improved technique. no lifting assistance required.  Ambulation/Gait Ambulation/Gait assistance: Min guard Gait Distance (Feet):  125 Feet Assistive device: Rolling walker (2 wheeled) Gait Pattern/deviations: Step-to pattern;Step-through pattern Gait velocity: decreased   General Gait Details: pt was able to ambulate ~ 125 ft with RW + gait belt for safety. He mostly ambulated with step to pattern however was able to progress to step through but not fully step throug sequencing. No LOB or unsteadiness. Vcs for posture correction   Stairs             Wheelchair Mobility    Modified Rankin (Stroke Patients Only)       Balance Overall balance assessment: Needs assistance Sitting-balance support: No upper extremity supported;Feet supported Sitting balance-Leahy Scale: Good Sitting balance - Comments: steady sitting reaching within BOS   Standing balance support: No upper extremity supported Standing balance-Leahy Scale: Fair Standing balance comment: steady static standing no UE support                            Cognition Arousal/Alertness: Awake/alert Behavior During Therapy: WFL for tasks assessed/performed Overall Cognitive Status: Within Functional Limits for tasks assessed                                 General Comments: Pt is motivated and cooperative. Able to follow commands well. only able to correctly state 2/3 hip precautions      Exercises General Exercises - Lower Extremity Ankle Circles/Pumps: AROM;Strengthening;Both;10 reps;Seated Long Arc Quad: AROM;Strengthening;Both;10 reps;Seated Other Exercises Other Exercises: Pt and  caregiver (spouse) educated on falls prevention strategies, safe use of AE/DME for ADL management in consideration of posterior THPs, posterior THPs, and routines modifications to support safety and fxl independence upon hospital DC. Treatment limited 2/2 pt lunch tray arriving during evaluation. Pt would benefit from further opportunity to trial AE for LB ADL management.    General Comments        Pertinent Vitals/Pain Pain  Assessment: 0-10 Pain Score: 6  Pain Location: L hip/thigh with mobility. Denies pain at rest. Pain Descriptors / Indicators: Discomfort;Sore Pain Intervention(s): Limited activity within patient's tolerance;Monitored during session    Catherine expects to be discharged to:: Private residence Living Arrangements: Spouse/significant other Available Help at Discharge: Available 24 hours/day Type of Home: House Home Access: Stairs to enter Entrance Stairs-Rails: Left Home Layout: One level Home Equipment: Walker - 2 wheels;Hand held shower head;Adaptive equipment;Grab bars - tub/shower;Cane - single point      Prior Function Level of Independence: Independent with assistive device(s)      Comments: Pt independent with ADL/IADL, using a 2WW or SPC for fxl mobility 2/2 hip pain, no recent falls hx.   PT Goals (current goals can now be found in the care plan section) Acute Rehab PT Goals Patient Stated Goal: To get better so I can return home" PT Goal Formulation: With patient/family Time For Goal Achievement: 03/21/20 Potential to Achieve Goals: Good Progress towards PT goals: Progressing toward goals    Frequency    BID      PT Plan Current plan remains appropriate    Co-evaluation              AM-PAC PT "6 Clicks" Mobility   Outcome Measure  Help needed turning from your back to your side while in a flat bed without using bedrails?: A Little Help needed moving from lying on your back to sitting on the side of a flat bed without using bedrails?: A Little Help needed moving to and from a bed to a chair (including a wheelchair)?: A Little Help needed standing up from a chair using your arms (e.g., wheelchair or bedside chair)?: A Little Help needed to walk in hospital room?: A Little Help needed climbing 3-5 steps with a railing? : A Little 6 Click Score: 18    End of Session Equipment Utilized During Treatment: Gait belt Activity Tolerance:  Patient tolerated treatment well Patient left: in chair;with call bell/phone within reach;with chair alarm set;with family/visitor present;with SCD's reapplied;Other (comment) Nurse Communication: Mobility status;Precautions;Weight bearing status;Other (comment) PT Visit Diagnosis: Other abnormalities of gait and mobility (R26.89);Muscle weakness (generalized) (M62.81);History of falling (Z91.81);Difficulty in walking, not elsewhere classified (R26.2);Pain Pain - Right/Left: Left Pain - part of body: Hip     Time: [id number] PT Time Calculation (min) (ACUTE ONLY): 24 min  Charges:  $Gait Training: 8-22 mins $Therapeutic Exercise: 8-22 mins $Therapeutic Activity: 8-22 mins                     Julaine Fusi PTA 03/08/20, 3:12 PM

## 2020-03-08 NOTE — Progress Notes (Signed)
Physical Therapy Treatment Patient Details Name: Nicholas Monroe MRN: [id number] DOB: [birth year] Today's Date: 03/08/2020    History of Present Illness Pt is a 79 y.o. 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  PMH includes L TKA, htn, OA, ocular myasthenia gravis (double vision), and R THA.    PT Comments    Pt resting in recliner upon PT arrival.  Able to progress to ambulating 120 feet with RW CGA (although multiple short standing rest breaks required 2nd half of ambulation distance d/t UE fatigue); vc's required for gait pattern initially; vc's also required for posterior hip precautions with turns.  Pain 5-6/10 L hip with ambulation but 3/10 at rest end of session.  Vc's required for posterior hip precautions with transfers.  Will continue to focus on strengthening and progressive functional mobility per pt tolerance.    Follow Up Recommendations  Home health PT;Supervision for mobility/OOB     Equipment Recommendations  Rolling walker with 5" wheels;3in1 (PT)    Recommendations for Other Services OT consult     Precautions / Restrictions Precautions Precautions: Posterior Hip;Fall Precaution Booklet Issued: Yes (comment) Restrictions Weight Bearing Restrictions: Yes LLE Weight Bearing: Weight bearing as tolerated    Mobility  Bed Mobility               General bed mobility comments: Deferred (pt sitting in recliner at beginning and end of session)  Transfers Overall transfer level: Needs assistance Equipment used: Rolling walker (2 wheeled) Transfers: Sit to/from Stand Sit to Stand: Min guard         General transfer comment: x2 trials standing from recliner with focus on maintaining posterior hip precautions; vc's for scooting forward towards edge of chair prior to standing and UE/LE placement  Ambulation/Gait Ambulation/Gait assistance: Min guard Gait Distance (Feet): 120 Feet Assistive device: Rolling walker (2 wheeled) Gait  Pattern/deviations: Step-to pattern;Antalgic Gait velocity: decreased   General Gait Details: vc's for gait technique and walker use; occasional vc's to stay closer to [employer] Mobility    Modified Rankin (Stroke Patients Only)       Balance Overall balance assessment: Needs assistance Sitting-balance support: No upper extremity supported;Feet supported Sitting balance-Leahy Scale: Good Sitting balance - Comments: steady sitting reaching within BOS   Standing balance support: No upper extremity supported Standing balance-Leahy Scale: Fair Standing balance comment: steady static standing no UE support                            Cognition Arousal/Alertness: Awake/alert Behavior During Therapy: WFL for tasks assessed/performed Overall Cognitive Status: Within Functional Limits for tasks assessed                                        Exercises General Exercises - Lower Extremity Ankle Circles/Pumps: AROM;Strengthening;Both;10 reps;Seated Long Arc Quad: AROM;Strengthening;Both;10 reps;Seated    General Comments   Nursing cleared pt for participation in physical therapy.  Pt agreeable to PT session.      Pertinent Vitals/Pain Pain Assessment: 0-10 Pain Score: 3  Pain Location: L hip/thigh Pain Descriptors / Indicators: Discomfort;Sore Pain Intervention(s): Limited activity within patient's tolerance;Monitored during session;Premedicated before session;Repositioned;Ice applied  Vitals (HR and O2 on room air) stable and WFL throughout treatment session.    Home  Living                      Prior Function            PT Goals (current goals can now be found in the care plan section) Acute Rehab PT Goals Patient Stated Goal: to improve pain and mobility PT Goal Formulation: With patient/family Time For Goal Achievement: 03/21/20 Potential to Achieve Goals: Good Progress towards PT goals: Progressing  toward goals    Frequency    BID      PT Plan Current plan remains appropriate    Co-evaluation              AM-PAC PT "6 Clicks" Mobility   Outcome Measure  Help needed turning from your back to your side while in a flat bed without using bedrails?: A Little Help needed moving from lying on your back to sitting on the side of a flat bed without using bedrails?: A Little Help needed moving to and from a bed to a chair (including a wheelchair)?: A Little Help needed standing up from a chair using your arms (e.g., wheelchair or bedside chair)?: A Little Help needed to walk in hospital room?: A Little Help needed climbing 3-5 steps with a railing? : A Little 6 Click Score: 18    End of Session Equipment Utilized During Treatment: Gait belt Activity Tolerance: Patient limited by fatigue Patient left: in chair;with call bell/phone within reach;with chair alarm set;with family/visitor present;with SCD's reapplied;Other (comment)(pillows between pt's knees for posterior hip precautions and B heels floating via pillows) Nurse Communication: Mobility status;Precautions;Weight bearing status;Other (comment)(via white board) PT Visit Diagnosis: Other abnormalities of gait and mobility (R26.89);Muscle weakness (generalized) (M62.81);History of falling (Z91.81);Difficulty in walking, not elsewhere classified (R26.2);Pain Pain - Right/Left: Left Pain - part of body: Hip     Time: [id number] PT Time Calculation (min) (ACUTE ONLY): 38 min  Charges:  $Gait Training: 8-22 mins $Therapeutic Exercise: 8-22 mins $Therapeutic Activity: 8-22 mins                     Leitha Bleak, PT 03/08/20, 12:18 PM

## 2020-03-09 LAB — CBC
HCT: 22.3 % — ABNORMAL LOW (ref 39.0–52.0)
Hemoglobin: 7.5 g/dL — ABNORMAL LOW (ref 13.0–17.0)
MCH: 31.4 pg (ref 26.0–34.0)
MCHC: 33.6 g/dL (ref 30.0–36.0)
MCV: 93.3 fL (ref 80.0–100.0)
Platelets: 109 10*3/uL — ABNORMAL LOW (ref 150–400)
RBC: 2.39 MIL/uL — ABNORMAL LOW (ref 4.22–5.81)
RDW: 14.6 % (ref 11.5–15.5)
WBC: 6 10*3/uL (ref 4.0–10.5)
nRBC: 0 % (ref 0.0–0.2)

## 2020-03-09 LAB — BASIC METABOLIC PANEL
Anion gap: 5 (ref 5–15)
BUN: 13 mg/dL (ref 8–23)
CO2: 25 mmol/L (ref 22–32)
Calcium: 8.5 mg/dL — ABNORMAL LOW (ref 8.9–10.3)
Chloride: 105 mmol/L (ref 98–111)
Creatinine, Ser: 0.99 mg/dL (ref 0.61–1.24)
GFR calc Af Amer: >60 mL/min (ref >60)
GFR calc non Af Amer: >60 mL/min (ref >60)
Glucose, Bld: 118 mg/dL — ABNORMAL HIGH (ref 70–99)
Potassium: 3.9 mmol/L (ref 3.5–5.1)
Sodium: 135 mmol/L (ref 135–145)

## 2020-03-09 MED ORDER — FE FUMARATE-B12-VIT C-FA-IFC PO CAPS: 1.0000 | ORAL_CAPSULE | Freq: Two times a day (BID) | ORAL | 0 refills | Status: DC

## 2020-03-09 NOTE — Progress Notes (Signed)
Occupational Therapy Treatment Patient Details Name: Nicholas Monroe MRN: [id number] DOB: 10-Apr-1941 Today's Date: 03/09/2020    History of present illness Pt is a 79 y.o. 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  PMH includes L TKA, htn, OA, ocular myasthenia gravis (double vision), and R THA.   OT comments  Pt seen for OT tx this date. Spouse present and engaged in session. Pt and spouse instructed in AE/DME for ADL mgt while maintaining posterior THPs with visual demo and verbal instruction. Pt declined to trial himself. Pt/spouse instructed in home/routines modifications and set up to support pt's return and recovery. Both verbalized understanding. Pt and spouse denied additional OT needs at this time and both eager to return home.   Follow Up Recommendations  Home health OT    Equipment Recommendations  Other (comment)(LH reacher, sock aid, LH shoe horn)    Recommendations for Other Services      Precautions / Restrictions Precautions Precautions: Posterior Hip;Fall Precaution Booklet Issued: Yes (comment) Restrictions Weight Bearing Restrictions: Yes LLE Weight Bearing: Weight bearing as tolerated       Mobility Bed Mobility Overal bed mobility: Needs Assistance Bed Mobility: Supine to Sit;Sit to Supine     Supine to sit: Min assist Sit to supine: Min assist   General bed mobility comments: in recliner at start and end of session  Transfers Overall transfer level: Needs assistance Equipment used: Rolling walker (2 wheeled) Transfers: Sit to/from Stand Sit to Stand: Supervision         General transfer comment: occasional vc's for posterior hip precautions; steady    Balance Overall balance assessment: Needs assistance Sitting-balance support: No upper extremity supported;Feet supported Sitting balance-Leahy Scale: Normal Sitting balance - Comments: steady sitting reaching outside BOS   Standing balance support: No upper extremity  supported Standing balance-Leahy Scale: Fair Standing balance comment: steady static standing no UE support                           ADL either performed or assessed with clinical judgement   ADL Overall ADL's : Needs assistance/impaired                                       General ADL Comments: Pt cont to require minimal to moderate assistance for LB ADL management. Set-up assist for grooming/UB dressing tasks. VC for maintaining precautions.     Vision Baseline Vision/History: Wears glasses Wears Glasses: Reading only Patient Visual Report: No change from baseline     Perception     Praxis      Cognition Arousal/Alertness: Awake/alert Behavior During Therapy: WFL for tasks assessed/performed Overall Cognitive Status: Within Functional Limits for tasks assessed                                 General Comments: Able to state 3/3 hip precautions beginning of session but later in session only 2/3 (pt's wife able to verbalize 3rd precaution to assist pt)        Exercises Other Exercises Other Exercises: Pt and spouse instructed in AE/DME for ADL mgt while maintaining posterior THPs with visual demo and verbal instruction. Pt declined to trial himself. Pt/spouse instructed in home/routines modifications and set up to support pt's return and recovery. Both verbalized understanding.  Shoulder Instructions       General Comments      Pertinent Vitals/ Pain       Pain Assessment: 0-10 Pain Score: 2  Pain Location: L hip/thigh Pain Descriptors / Indicators: Discomfort;Sore Pain Intervention(s): Limited activity within patient's tolerance;Monitored during session;Premedicated before session;Repositioned  Home Living                                          Prior Functioning/Environment              Frequency  Min 2X/week        Progress Toward Goals  OT Goals(current goals can now be found in the  care plan section)  Progress towards OT goals: Progressing toward goals  Acute Rehab OT Goals Patient Stated Goal: to return home and have less pain OT Goal Formulation: With patient Time For Goal Achievement: 03/22/20 Potential to Achieve Goals: Good  Plan Discharge plan remains appropriate;Frequency remains appropriate    Co-evaluation                 AM-PAC OT "6 Clicks" Daily Activity     Outcome Measure   Help from another person eating meals?: None Help from another person taking care of personal grooming?: A Little Help from another person toileting, which includes using toliet, bedpan, or urinal?: A Little Help from another person bathing (including washing, rinsing, drying)?: A Little Help from another person to put on and taking off regular upper body clothing?: A Little Help from another person to put on and taking off regular lower body clothing?: A Little 6 Click Score: 19    End of Session    OT Visit Diagnosis: Other abnormalities of gait and mobility (R26.89);Pain Pain - Right/Left: Left Pain - part of body: Hip   Activity Tolerance Patient tolerated treatment well   Patient Left in chair;with call bell/phone within reach;with chair alarm set;with family/visitor present;with SCD's reapplied   Nurse Communication          Time: [id number] OT Time Calculation (min): 15 min  Charges: OT General Charges $OT Visit: 1 Visit OT Treatments $Self Care/Home Management : 8-22 mins  Jeni Salles, MPH, MS, OTR/L ascom [phone] 03/09/20, 1:08 PM

## 2020-03-09 NOTE — Progress Notes (Signed)
Patient discharged home with his wife , w/c transport patient to personal vehicle iv REMOVED FROM [facility]. DISCHARGE SUMMARY AND BELONGINGS WITH PATIENT

## 2020-03-09 NOTE — Care Management Important Message (Signed)
Important Message  Patient Details  Name: Nicholas Monroe MRN: [id number] Date of Birth: [birth year]   Medicare Important Message Given:  Yes     [name] 03/09/2020, 11:25 AM

## 2020-03-09 NOTE — Progress Notes (Signed)
Subjective: 3 Days Post-Op Procedure(s) (LRB): TOTAL HIP ARTHROPLASTY (Left) Patient reports pain as mild.   Patient is well, and has had no acute complaints or problems Plan is to go Home after hospital stay. Negative for chest pain and shortness of breath Fever: no Gastrointestinal: Negative for nausea and vomiting  Objective: Vital signs in last 24 hours: Temp:  [98.1 F (36.7 C)-99.2 F (37.3 C)] 99.2 F (37.3 C) (03/26 0401) Pulse Rate:  [72-90] 86 (03/26 0401) Resp:  [16-18] 17 (03/26 0401) BP: (92-136)/(56-76) 120/66 (03/26 0401) SpO2:  [97 %-100 %] 97 % (03/26 0401)  Intake/Output from previous day:  Intake/Output Summary (Last 24 hours) at 03/09/2020 0803 Last data filed at 03/09/2020 [id number] Gross per 24 hour  Intake 60 ml  Output 2800 ml  Net -2740 ml    Intake/Output this shift: No intake/output data recorded.  Labs: Recent Labs    03/07/20 0601 03/07/20 1614 03/08/20 0532 03/08/20 1218 03/09/20 0617  HGB 7.7* 9.2* 8.0* 8.0* 7.5*   Recent Labs    03/08/20 0532 03/09/20 0617  WBC 8.7 6.0  RBC 2.57* 2.39*  HCT 23.5* 22.3*  PLT 104* 109*   Recent Labs    03/08/20 0532 03/09/20 0617  NA 133* 135  K 4.1 3.9  CL 103 105  CO2 25 25  BUN 14 13  CREATININE 1.03 0.99  GLUCOSE 120* 118*  CALCIUM 8.6* 8.5*   No results for input(s): LABPT, INR in the last 72 hours.   EXAM General - Patient is Alert and Oriented Extremity - Neurovascular intact Sensation intact distally Dorsiflexion/Plantar flexion intact Compartment soft Dressing/Incision - clean, dry, blood tinged and scant drainage Motor Function - intact, moving foot and toes well on exam.   Past Medical History:  Diagnosis Date  . Arthritis   . Hyperlipidemia   . Hypertension   . Ocular myasthenia gravis ([place])    double vision    Assessment/Plan: 3 Days Post-Op Procedure(s) (LRB): TOTAL HIP ARTHROPLASTY (Left) Active Problems:   Status post total hip replacement,  left  Estimated body mass index is 25.83 kg/m as calculated from the following:   Height as of this encounter: 5\' 10"  (1.778 m).   Weight as of this encounter: 81.6 kg. Advance diet Up with therapy   Labs reviewed. Hg 7.5, this Am.  WIll work with PT today, if he has any symptoms such as vision change or dizziness or increased HR/BP will plan for transfusion. If he remains stable for therapy will discharge home on Iron. Continue with PT today. Continue working on BM.  Suppository given today. Plan for possible d/c home today.  DVT Prophylaxis - Lovenox, Foot Pumps and TED hose Weight-Bearing as tolerated to left leg  J. Cameron Proud, PA-C Orthopaedic Surgery 03/09/2020, 8:03 AM

## 2020-03-09 NOTE — Progress Notes (Signed)
Physical Therapy Treatment Patient Details Name: Nicholas Monroe MRN: [id number] DOB: 27-Feb-1941 Today's Date: 03/09/2020    History of Present Illness Pt is a 79 y.o. 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  PMH includes L TKA, htn, OA, ocular myasthenia gravis (double vision), and R THA.    PT Comments    Pt resting in recliner upon PT arrival.  During session pt min assist with L LE supine to/from sitting edge of bed (pt's wife able to safely assist pt with this activity); SBA with multiple transfers during session using RW (pt's wife able to safely cue pt with posterior hip precautions and safe transfer technique); CGA with ambulation around nursing loop with use of RW; and able to ascend/descend 4 steps with L railing (see below for details).  Pt's BP 117/68 (HR 81 bpm with O2 100% on room air) at rest beginning of session; BP 122/88 (HR 86 bpm with O2 100% on room air) after ambulation; and BP 118/70 (HR 95 bpm with O2 95% on room air) after stairs trial.  Pt denied any vision changes and/or dizziness during session (or any other adverse symptom).  Recommend SBA with functional mobility initially upon hospital discharge for safety with posterior hip precautions (pt's wife demonstrates appropriate understanding and ability to assist with this).   Follow Up Recommendations  Home health PT;Supervision for mobility/OOB     Equipment Recommendations  Rolling walker with 5" wheels;3in1 (PT)    Recommendations for Other Services OT consult     Precautions / Restrictions Precautions Precautions: Posterior Hip;Fall Precaution Booklet Issued: Yes (comment) Restrictions Weight Bearing Restrictions: Yes LLE Weight Bearing: Weight bearing as tolerated    Mobility  Bed Mobility Overal bed mobility: Needs Assistance Bed Mobility: Supine to Sit;Sit to Supine     Supine to sit: Min assist Sit to supine: Min assist   General bed mobility comments: bed flat; assist for L LE  in/out of bed (pt's wife provided assist safely)  Transfers Overall transfer level: Needs assistance Equipment used: Rolling walker (2 wheeled) Transfers: Sit to/from Stand Sit to Stand: Supervision         General transfer comment: occasional vc's for posterior hip precautions; steady  Ambulation/Gait Ambulation/Gait assistance: Min guard Gait Distance (Feet): 180 Feet Assistive device: Rolling walker (2 wheeled)   Gait velocity: decreased   General Gait Details: step to progressing to partial step through gait pattern; steady with RW use; initial vc's for gait pattern   Stairs Stairs: Yes Stairs assistance: Min guard(2nd assist present to assist but not required) Stair Management: One rail Left;Step to pattern;Forwards Number of Stairs: 4 General stair comments: pt able to verbalize stepping pattern/sequencing of LE's up/down steps; pt had difficulty with ascending 2nd step requiring 2 attempts to ascend this step (CGA provided for safety but pt able to self correct with use of L railing) but then did well with steps after that   Wheelchair Mobility    Modified Rankin (Stroke Patients Only)       Balance Overall balance assessment: Needs assistance Sitting-balance support: No upper extremity supported;Feet supported Sitting balance-Leahy Scale: Normal Sitting balance - Comments: steady sitting reaching outside BOS   Standing balance support: No upper extremity supported Standing balance-Leahy Scale: Fair Standing balance comment: steady static standing no UE support                            Cognition Arousal/Alertness: Awake/alert Behavior  During Therapy: WFL for tasks assessed/performed Overall Cognitive Status: Within Functional Limits for tasks assessed                                 General Comments: Able to state 3/3 hip precautions beginning of session but later in session only 2/3 (pt's wife able to verbalize 3rd precaution  to assist pt)      Exercises      General Comments   Nursing cleared pt for participation in physical therapy.  Pt agreeable to PT session.      Pertinent Vitals/Pain Pain Assessment: 0-10 Pain Score: 2  Pain Location: L hip/thigh Pain Descriptors / Indicators: Discomfort;Sore Pain Intervention(s): Limited activity within patient's tolerance;Monitored during session;Premedicated before session;Repositioned    Home Living                      Prior Function            PT Goals (current goals can now be found in the care plan section) Acute Rehab PT Goals Patient Stated Goal: to return home and have less pain PT Goal Formulation: With patient/family Time For Goal Achievement: 03/21/20 Potential to Achieve Goals: Good Progress towards PT goals: Progressing toward goals    Frequency    BID      PT Plan Current plan remains appropriate    Co-evaluation              AM-PAC PT "6 Clicks" Mobility   Outcome Measure  Help needed turning from your back to your side while in a flat bed without using bedrails?: A Little Help needed moving from lying on your back to sitting on the side of a flat bed without using bedrails?: A Little Help needed moving to and from a bed to a chair (including a wheelchair)?: A Little Help needed standing up from a chair using your arms (e.g., wheelchair or bedside chair)?: A Little Help needed to walk in hospital room?: A Little Help needed climbing 3-5 steps with a railing? : A Little 6 Click Score: 18    End of Session Equipment Utilized During Treatment: Gait belt Activity Tolerance: Patient tolerated treatment well Patient left: in chair;with call bell/phone within reach;with chair alarm set;with family/visitor present;with SCD's reapplied;Other (comment)(pillow between pt's knees (for posterior hip precautions) and B heels floating via pillow support) Nurse Communication: Mobility status;Precautions;Weight bearing  status PT Visit Diagnosis: Other abnormalities of gait and mobility (R26.89);Muscle weakness (generalized) (M62.81);History of falling (Z91.81);Difficulty in walking, not elsewhere classified (R26.2);Pain Pain - Right/Left: Left Pain - part of body: Hip     Time: [id number] PT Time Calculation (min) (ACUTE ONLY): 90 min  Charges:  $Gait Training: 38-52 mins $Therapeutic Activity: 23-37 mins                    [facility], PT 03/09/20, 1:09 PM

## 2020-03-10 DIAGNOSIS — M1990 Unspecified osteoarthritis, unspecified site: Secondary | ICD-10-CM | POA: Diagnosis not present

## 2020-03-10 DIAGNOSIS — G7000 Myasthenia gravis without (acute) exacerbation: Secondary | ICD-10-CM | POA: Diagnosis not present

## 2020-03-10 DIAGNOSIS — E785 Hyperlipidemia, unspecified: Secondary | ICD-10-CM | POA: Diagnosis not present

## 2020-03-10 DIAGNOSIS — Z96641 Presence of right artificial hip joint: Secondary | ICD-10-CM | POA: Diagnosis not present

## 2020-03-10 DIAGNOSIS — Z96652 Presence of left artificial knee joint: Secondary | ICD-10-CM | POA: Diagnosis not present

## 2020-03-10 DIAGNOSIS — Z471 Aftercare following joint replacement surgery: Secondary | ICD-10-CM | POA: Diagnosis not present

## 2020-03-10 DIAGNOSIS — Z7901 Long term (current) use of anticoagulants: Secondary | ICD-10-CM | POA: Diagnosis not present

## 2020-03-10 DIAGNOSIS — I10 Essential (primary) hypertension: Secondary | ICD-10-CM | POA: Diagnosis not present

## 2020-03-12 DIAGNOSIS — Z7901 Long term (current) use of anticoagulants: Secondary | ICD-10-CM | POA: Diagnosis not present

## 2020-03-12 DIAGNOSIS — G7000 Myasthenia gravis without (acute) exacerbation: Secondary | ICD-10-CM | POA: Diagnosis not present

## 2020-03-12 DIAGNOSIS — I10 Essential (primary) hypertension: Secondary | ICD-10-CM | POA: Diagnosis not present

## 2020-03-12 DIAGNOSIS — Z96652 Presence of left artificial knee joint: Secondary | ICD-10-CM | POA: Diagnosis not present

## 2020-03-12 DIAGNOSIS — Z96641 Presence of right artificial hip joint: Secondary | ICD-10-CM | POA: Diagnosis not present

## 2020-03-12 DIAGNOSIS — M1990 Unspecified osteoarthritis, unspecified site: Secondary | ICD-10-CM | POA: Diagnosis not present

## 2020-03-12 DIAGNOSIS — E785 Hyperlipidemia, unspecified: Secondary | ICD-10-CM | POA: Diagnosis not present

## 2020-03-12 DIAGNOSIS — Z471 Aftercare following joint replacement surgery: Secondary | ICD-10-CM | POA: Diagnosis not present

## 2020-03-15 DIAGNOSIS — Z96641 Presence of right artificial hip joint: Secondary | ICD-10-CM | POA: Diagnosis not present

## 2020-03-15 DIAGNOSIS — I10 Essential (primary) hypertension: Secondary | ICD-10-CM | POA: Diagnosis not present

## 2020-03-15 DIAGNOSIS — G7000 Myasthenia gravis without (acute) exacerbation: Secondary | ICD-10-CM | POA: Diagnosis not present

## 2020-03-15 DIAGNOSIS — Z96652 Presence of left artificial knee joint: Secondary | ICD-10-CM | POA: Diagnosis not present

## 2020-03-15 DIAGNOSIS — E785 Hyperlipidemia, unspecified: Secondary | ICD-10-CM | POA: Diagnosis not present

## 2020-03-15 DIAGNOSIS — Z471 Aftercare following joint replacement surgery: Secondary | ICD-10-CM | POA: Diagnosis not present

## 2020-03-15 DIAGNOSIS — Z7901 Long term (current) use of anticoagulants: Secondary | ICD-10-CM | POA: Diagnosis not present

## 2020-03-15 DIAGNOSIS — M1990 Unspecified osteoarthritis, unspecified site: Secondary | ICD-10-CM | POA: Diagnosis not present

## 2020-03-19 DIAGNOSIS — Z96652 Presence of left artificial knee joint: Secondary | ICD-10-CM | POA: Diagnosis not present

## 2020-03-19 DIAGNOSIS — Z96641 Presence of right artificial hip joint: Secondary | ICD-10-CM | POA: Diagnosis not present

## 2020-03-19 DIAGNOSIS — G7000 Myasthenia gravis without (acute) exacerbation: Secondary | ICD-10-CM | POA: Diagnosis not present

## 2020-03-19 DIAGNOSIS — Z471 Aftercare following joint replacement surgery: Secondary | ICD-10-CM | POA: Diagnosis not present

## 2020-03-19 DIAGNOSIS — M1990 Unspecified osteoarthritis, unspecified site: Secondary | ICD-10-CM | POA: Diagnosis not present

## 2020-03-19 DIAGNOSIS — E785 Hyperlipidemia, unspecified: Secondary | ICD-10-CM | POA: Diagnosis not present

## 2020-03-19 DIAGNOSIS — I10 Essential (primary) hypertension: Secondary | ICD-10-CM | POA: Diagnosis not present

## 2020-03-19 DIAGNOSIS — Z7901 Long term (current) use of anticoagulants: Secondary | ICD-10-CM | POA: Diagnosis not present

## 2020-03-20 DIAGNOSIS — Z96641 Presence of right artificial hip joint: Secondary | ICD-10-CM | POA: Diagnosis not present

## 2020-03-20 DIAGNOSIS — G7000 Myasthenia gravis without (acute) exacerbation: Secondary | ICD-10-CM | POA: Diagnosis not present

## 2020-03-20 DIAGNOSIS — Z7901 Long term (current) use of anticoagulants: Secondary | ICD-10-CM | POA: Diagnosis not present

## 2020-03-20 DIAGNOSIS — M1990 Unspecified osteoarthritis, unspecified site: Secondary | ICD-10-CM | POA: Diagnosis not present

## 2020-03-20 DIAGNOSIS — Z471 Aftercare following joint replacement surgery: Secondary | ICD-10-CM | POA: Diagnosis not present

## 2020-03-20 DIAGNOSIS — I10 Essential (primary) hypertension: Secondary | ICD-10-CM | POA: Diagnosis not present

## 2020-03-20 DIAGNOSIS — Z96652 Presence of left artificial knee joint: Secondary | ICD-10-CM | POA: Diagnosis not present

## 2020-03-20 DIAGNOSIS — E785 Hyperlipidemia, unspecified: Secondary | ICD-10-CM | POA: Diagnosis not present

## 2020-03-21 DIAGNOSIS — D649 Anemia, unspecified: Secondary | ICD-10-CM | POA: Diagnosis not present

## 2020-03-22 DIAGNOSIS — Z7901 Long term (current) use of anticoagulants: Secondary | ICD-10-CM | POA: Diagnosis not present

## 2020-03-22 DIAGNOSIS — M1990 Unspecified osteoarthritis, unspecified site: Secondary | ICD-10-CM | POA: Diagnosis not present

## 2020-03-22 DIAGNOSIS — Z96652 Presence of left artificial knee joint: Secondary | ICD-10-CM | POA: Diagnosis not present

## 2020-03-22 DIAGNOSIS — G7000 Myasthenia gravis without (acute) exacerbation: Secondary | ICD-10-CM | POA: Diagnosis not present

## 2020-03-22 DIAGNOSIS — Z471 Aftercare following joint replacement surgery: Secondary | ICD-10-CM | POA: Diagnosis not present

## 2020-03-22 DIAGNOSIS — E785 Hyperlipidemia, unspecified: Secondary | ICD-10-CM | POA: Diagnosis not present

## 2020-03-22 DIAGNOSIS — I10 Essential (primary) hypertension: Secondary | ICD-10-CM | POA: Diagnosis not present

## 2020-03-22 DIAGNOSIS — Z96641 Presence of right artificial hip joint: Secondary | ICD-10-CM | POA: Diagnosis not present

## 2020-03-27 DIAGNOSIS — M1990 Unspecified osteoarthritis, unspecified site: Secondary | ICD-10-CM | POA: Diagnosis not present

## 2020-03-27 DIAGNOSIS — G7000 Myasthenia gravis without (acute) exacerbation: Secondary | ICD-10-CM | POA: Diagnosis not present

## 2020-03-27 DIAGNOSIS — Z96641 Presence of right artificial hip joint: Secondary | ICD-10-CM | POA: Diagnosis not present

## 2020-03-27 DIAGNOSIS — I10 Essential (primary) hypertension: Secondary | ICD-10-CM | POA: Diagnosis not present

## 2020-03-27 DIAGNOSIS — E785 Hyperlipidemia, unspecified: Secondary | ICD-10-CM | POA: Diagnosis not present

## 2020-03-27 DIAGNOSIS — Z471 Aftercare following joint replacement surgery: Secondary | ICD-10-CM | POA: Diagnosis not present

## 2020-03-27 DIAGNOSIS — Z7901 Long term (current) use of anticoagulants: Secondary | ICD-10-CM | POA: Diagnosis not present

## 2020-03-27 DIAGNOSIS — Z96652 Presence of left artificial knee joint: Secondary | ICD-10-CM | POA: Diagnosis not present

## 2020-03-29 DIAGNOSIS — Z7901 Long term (current) use of anticoagulants: Secondary | ICD-10-CM | POA: Diagnosis not present

## 2020-03-29 DIAGNOSIS — Z471 Aftercare following joint replacement surgery: Secondary | ICD-10-CM | POA: Diagnosis not present

## 2020-03-29 DIAGNOSIS — Z96641 Presence of right artificial hip joint: Secondary | ICD-10-CM | POA: Diagnosis not present

## 2020-03-29 DIAGNOSIS — E785 Hyperlipidemia, unspecified: Secondary | ICD-10-CM | POA: Diagnosis not present

## 2020-03-29 DIAGNOSIS — M1990 Unspecified osteoarthritis, unspecified site: Secondary | ICD-10-CM | POA: Diagnosis not present

## 2020-03-29 DIAGNOSIS — Z96652 Presence of left artificial knee joint: Secondary | ICD-10-CM | POA: Diagnosis not present

## 2020-03-29 DIAGNOSIS — G7000 Myasthenia gravis without (acute) exacerbation: Secondary | ICD-10-CM | POA: Diagnosis not present

## 2020-03-29 DIAGNOSIS — I10 Essential (primary) hypertension: Secondary | ICD-10-CM | POA: Diagnosis not present

## 2020-04-03 DIAGNOSIS — Z7901 Long term (current) use of anticoagulants: Secondary | ICD-10-CM | POA: Diagnosis not present

## 2020-04-03 DIAGNOSIS — M1990 Unspecified osteoarthritis, unspecified site: Secondary | ICD-10-CM | POA: Diagnosis not present

## 2020-04-03 DIAGNOSIS — E785 Hyperlipidemia, unspecified: Secondary | ICD-10-CM | POA: Diagnosis not present

## 2020-04-03 DIAGNOSIS — G7000 Myasthenia gravis without (acute) exacerbation: Secondary | ICD-10-CM | POA: Diagnosis not present

## 2020-04-03 DIAGNOSIS — Z471 Aftercare following joint replacement surgery: Secondary | ICD-10-CM | POA: Diagnosis not present

## 2020-04-03 DIAGNOSIS — Z96652 Presence of left artificial knee joint: Secondary | ICD-10-CM | POA: Diagnosis not present

## 2020-04-03 DIAGNOSIS — Z96641 Presence of right artificial hip joint: Secondary | ICD-10-CM | POA: Diagnosis not present

## 2020-04-03 DIAGNOSIS — I10 Essential (primary) hypertension: Secondary | ICD-10-CM | POA: Diagnosis not present

## 2020-04-05 DIAGNOSIS — E785 Hyperlipidemia, unspecified: Secondary | ICD-10-CM | POA: Diagnosis not present

## 2020-04-05 DIAGNOSIS — G7000 Myasthenia gravis without (acute) exacerbation: Secondary | ICD-10-CM | POA: Diagnosis not present

## 2020-04-05 DIAGNOSIS — M1990 Unspecified osteoarthritis, unspecified site: Secondary | ICD-10-CM | POA: Diagnosis not present

## 2020-04-05 DIAGNOSIS — Z96641 Presence of right artificial hip joint: Secondary | ICD-10-CM | POA: Diagnosis not present

## 2020-04-05 DIAGNOSIS — Z96652 Presence of left artificial knee joint: Secondary | ICD-10-CM | POA: Diagnosis not present

## 2020-04-05 DIAGNOSIS — Z471 Aftercare following joint replacement surgery: Secondary | ICD-10-CM | POA: Diagnosis not present

## 2020-04-05 DIAGNOSIS — Z7901 Long term (current) use of anticoagulants: Secondary | ICD-10-CM | POA: Diagnosis not present

## 2020-04-05 DIAGNOSIS — I10 Essential (primary) hypertension: Secondary | ICD-10-CM | POA: Diagnosis not present

## 2020-04-18 DIAGNOSIS — M1612 Unilateral primary osteoarthritis, left hip: Secondary | ICD-10-CM | POA: Diagnosis not present

## 2020-06-01 DIAGNOSIS — G7000 Myasthenia gravis without (acute) exacerbation: Secondary | ICD-10-CM | POA: Diagnosis not present

## 2020-06-04 ENCOUNTER — Other Ambulatory Visit: Payer: Self-pay | Admitting: Neurology

## 2020-06-04 ENCOUNTER — Other Ambulatory Visit (HOSPITAL_COMMUNITY): Payer: Self-pay | Admitting: Neurology

## 2020-06-04 DIAGNOSIS — G7000 Myasthenia gravis without (acute) exacerbation: Secondary | ICD-10-CM

## 2020-06-28 ENCOUNTER — Other Ambulatory Visit: Payer: Self-pay

## 2020-06-28 ENCOUNTER — Encounter: Payer: Self-pay | Admitting: Family Medicine

## 2020-06-28 ENCOUNTER — Ambulatory Visit: Payer: Medicare PPO | Admitting: Family Medicine

## 2020-06-28 VITALS — BP 126/74 | HR 62 | Ht 70.0 in | Wt 185.0 lb

## 2020-06-28 DIAGNOSIS — D501 Sideropenic dysphagia: Secondary | ICD-10-CM

## 2020-06-28 DIAGNOSIS — E782 Mixed hyperlipidemia: Secondary | ICD-10-CM | POA: Diagnosis not present

## 2020-06-28 DIAGNOSIS — I10 Essential (primary) hypertension: Secondary | ICD-10-CM

## 2020-06-28 MED ORDER — FENOFIBRATE 145 MG PO TABS: 72.5000 mg | ORAL_TABLET | Freq: Every day | ORAL | 1 refills | Status: DC

## 2020-06-28 MED ORDER — ATENOLOL 50 MG PO TABS: 50.0000 mg | ORAL_TABLET | Freq: Two times a day (BID) | ORAL | 1 refills | Status: DC

## 2020-06-28 MED ORDER — LISINOPRIL 10 MG PO TABS: 10.0000 mg | ORAL_TABLET | Freq: Every day | ORAL | 1 refills | Status: DC

## 2020-06-28 NOTE — Progress Notes (Signed)
Date:  06/28/2020   Name:  Nicholas Monroe   DOB:  [birth year]   MRN:  [id number]   Chief Complaint: Hypertension, Hyperlipidemia, and Anemia  Hypertension This is a chronic problem. The current episode started more than 1 year ago. The problem has been gradually improving since onset. The problem is controlled. Pertinent negatives include no anxiety, blurred vision, chest pain, headaches, malaise/fatigue, neck pain, orthopnea, palpitations, peripheral edema, PND, shortness of breath or sweats. There are no associated agents to hypertension. There are no known risk factors for coronary artery disease. Past treatments include ACE inhibitors and beta blockers. The current treatment provides moderate improvement. There are no compliance problems.  There is no history of angina, kidney disease, CAD/MI, CVA, heart failure, left ventricular hypertrophy, PVD or retinopathy. There is no history of chronic renal disease, a hypertension causing med or renovascular disease.  Hyperlipidemia This is a chronic problem. The current episode started more than 1 year ago. The problem is controlled. Recent lipid tests were reviewed and are normal. He has no history of chronic renal disease, diabetes, hypothyroidism, liver disease, obesity or nephrotic syndrome. Pertinent negatives include no chest pain, myalgias or shortness of breath. Treatments tried: omega 3. The current treatment provides mild improvement of lipids. There are no compliance problems.  There are no known risk factors for coronary artery disease.  Anemia Presents for follow-up visit. There has been no abdominal pain, anorexia, bruising/bleeding easily, confusion, fever, leg swelling, light-headedness, malaise/fatigue, pallor, palpitations, paresthesias, pica or weight loss. There is no history of chronic renal disease, heart failure or hypothyroidism. There are no compliance problems.     Lab Results  Component Value Date   CREATININE 0.99  03/09/2020   BUN 13 03/09/2020   NA 135 03/09/2020   K 3.9 03/09/2020   CL 105 03/09/2020   CO2 25 03/09/2020   Lab Results  Component Value Date   CHOL 135 12/29/2019   HDL 35 (L) 12/29/2019   LDLCALC 83 12/29/2019   TRIG 85 12/29/2019   CHOLHDL 4.3 01/06/2019   No results found for: TSH No results found for: HGBA1C Lab Results  Component Value Date   WBC 6.0 03/09/2020   HGB 7.5 (L) 03/09/2020   HCT 22.3 (L) 03/09/2020   MCV 93.3 03/09/2020   PLT 109 (L) 03/09/2020   Lab Results  Component Value Date   ALT 11 07/07/2019   AST 18 07/07/2019   ALKPHOS 40 07/07/2019   BILITOT 0.6 07/07/2019     Review of Systems  Constitutional: Negative for chills, fever, malaise/fatigue and weight loss.  HENT: Negative for drooling, ear discharge, ear pain and sore throat.   Eyes: Negative for blurred vision.  Respiratory: Negative for cough, shortness of breath and wheezing.   Cardiovascular: Negative for chest pain, palpitations, orthopnea, leg swelling and PND.  Gastrointestinal: Negative for abdominal pain, anorexia, blood in stool, constipation, diarrhea and nausea.  Endocrine: Negative for polydipsia.  Genitourinary: Negative for dysuria, frequency, hematuria and urgency.  Musculoskeletal: Negative for back pain, myalgias and neck pain.  Skin: Negative for pallor and rash.  Allergic/Immunologic: Negative for environmental allergies.  Neurological: Negative for dizziness, light-headedness, headaches and paresthesias.  Hematological: Does not bruise/bleed easily.  Psychiatric/Behavioral: Negative for confusion and suicidal ideas. The patient is not nervous/anxious.     Patient Active Problem List   Diagnosis Date Noted  . 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 Benign prostatic hyperplasia with nocturia 12/29/2019  . Status post  total knee replacement using cement, left 10/20/2019  . Lumbar spondylosis 03/04/2019  . Primary osteoarthritis of left hip  03/04/2019  . Encounter for screening colonoscopy 10/28/2018  . Nocturia more than twice per night 12/30/2017  . Hyperlipidemia 05/15/2016  . Essential hypertension 05/15/2016  . Closed right hip fracture ([place]) 07/05/2015    No Known Allergies  Past Surgical History:  Procedure Laterality Date  . APPENDECTOMY    . COLONOSCOPY  01/2009   normal- Dr Bary Castilla  . COLONOSCOPY WITH PROPOFOL N/A 11/03/2018   Procedure: COLONOSCOPY WITH PROPOFOL;  Surgeon: Robert Bellow, MD;  Location: [facility];  Service: Endoscopy;  Laterality: N/A;  . EYE SURGERY Bilateral    cataracts  . HERNIA REPAIR    . TOTAL HIP ARTHROPLASTY Right 07/06/2015   Procedure: TOTAL HIP ARTHROPLASTY;  Surgeon: Corky Mull, MD;  Location: [facility] ORS;  Service: Orthopedics;  Laterality: Right;  . TOTAL HIP ARTHROPLASTY Left 03/06/2020   Procedure: TOTAL HIP ARTHROPLASTY;  Surgeon: Corky Mull, MD;  Location: [facility] ORS;  Service: Orthopedics;  Laterality: Left;  . TOTAL KNEE ARTHROPLASTY Left 10/20/2019   Procedure: TOTAL KNEE ARTHROPLASTY;  Surgeon: Corky Mull, MD;  Location: [facility] ORS;  Service: Orthopedics;  Laterality: Left;    Social History   Tobacco Use  . Smoking status: Never Smoker  . Smokeless tobacco: Never Used  . Tobacco comment: smoking cessation materials not required  Vaping Use  . Vaping Use: Never used  Substance Use Topics  . Alcohol use: Not Currently  . Drug use: No     Medication list has been reviewed and updated.  Current Meds  Medication Sig  . acetaminophen (TYLENOL) 500 MG tablet Take 1,000 mg by mouth every 8 (eight) hours as needed for moderate pain.   Marland Kitchen aspirin EC 81 MG tablet Take 81 mg by mouth daily. Swallow whole.  Marland Kitchen atenolol (TENORMIN) 50 MG tablet Take 1 tablet (50 mg total) by mouth 2 (two) times daily.  . fenofibrate (TRICOR) 145 MG tablet Take 0.5 tablets (72.5 mg total) by mouth at bedtime.  . ferrous [id number] C-folic acid (TRINSICON / FOLTRIN)  capsule Take 1 capsule by mouth 2 (two) times daily after a meal.  . folic acid (FOLVITE) 1 MG tablet Take 1 mg by mouth daily.   Marland Kitchen lisinopril (ZESTRIL) 10 MG tablet Take 1 tablet (10 mg total) by mouth daily.  . methotrexate (RHEUMATREX) 2.5 MG tablet Take 15 mg by mouth every Saturday.   . Multiple Vitamins-Minerals (CENTRUM SILVER PO) Take 1 tablet by mouth daily.  . Omega-3 Fatty Acids (FISH OIL) 1000 MG CAPS Take 2,000 mg by mouth in the morning and at bedtime.   . [DISCONTINUED] enoxaparin (LOVENOX) 40 MG/0.4ML injection Inject 0.4 mLs (40 mg total) into the skin daily.  . [DISCONTINUED] Melatonin 3 MG TABS Take 3 mg by mouth at bedtime.   . [DISCONTINUED] omega-3 acid ethyl esters (LOVAZA) 1 g capsule TAKE (2) CAPSULES BY MOUTH TWICE DAILY    PHQ 2/9 Scores 06/28/2020 02/08/2020 12/29/2019 07/07/2019  PHQ - 2 Score 0 0 0 0  PHQ- 9 Score 0 - 0 0    GAD 7 : Generalized Anxiety Score 06/28/2020 12/29/2019  Nervous, Anxious, on Edge 0 0  Control/stop worrying 0 0  Worry too much - different things 0 0  Trouble relaxing 0 0  Restless 0 0  Easily annoyed or irritable 0 0  Afraid - awful might happen 0 0  Total GAD 7  Score 0 0    BP Readings from Last 3 Encounters:  06/28/20 126/74  03/09/20 102/67  12/29/19 120/68    Physical Exam Vitals and nursing note reviewed.  HENT:     Head: Normocephalic.     Right Ear: External ear normal.     Left Ear: External ear normal.     Nose: Nose normal.  Eyes:     General: No scleral icterus.       Right eye: No discharge.        Left eye: No discharge.     Conjunctiva/sclera: Conjunctivae normal.     Pupils: Pupils are equal, round, and reactive to light.  Neck:     Thyroid: No thyromegaly.     Vascular: No JVD.     Trachea: No tracheal deviation.  Cardiovascular:     Rate and Rhythm: Normal rate and regular rhythm.     Pulses: Normal pulses.          Radial pulses are 2+ on the right side and 2+ on the left side.     Heart sounds:  Normal heart sounds, S1 normal and S2 normal. No murmur heard.  No systolic murmur is present.  No diastolic murmur is present.  No friction rub. No gallop. No S3 or S4 sounds.   Pulmonary:     Effort: No respiratory distress.     Breath sounds: Normal breath sounds. No wheezing or rales.  Abdominal:     General: Bowel sounds are normal.     Palpations: Abdomen is soft. There is no mass.     Tenderness: There is no abdominal tenderness. There is no guarding or rebound.  Musculoskeletal:        General: No tenderness. Normal range of motion.     Cervical back: Normal range of motion and neck supple.  Lymphadenopathy:     Cervical: No cervical adenopathy.  Skin:    General: Skin is warm.     Findings: No rash.  Neurological:     Mental Status: He is alert and oriented to person, place, and time.     Cranial Nerves: No cranial nerve deficit.     Deep Tendon Reflexes: Reflexes are normal and symmetric.     Wt Readings from Last 3 Encounters:  06/28/20 185 lb (83.9 kg)  03/06/20 180 lb (81.6 kg)  02/23/20 184 lb (83.5 kg)    BP 126/74   Pulse 62   Ht 5\' 10"  (1.778 m)   Wt 185 lb (83.9 kg)   SpO2 97%   BMI 26.54 kg/m   Assessment and Plan: 1. Essential hypertension Chronic.  Controlled.  Stable.  Continue atenolol 50 mg twice a day and lisinopril 10 mg once a day. - atenolol (TENORMIN) 50 MG tablet; Take 1 tablet (50 mg total) by mouth 2 (two) times daily.  Dispense: 180 tablet; Refill: 1 - lisinopril (ZESTRIL) 10 MG tablet; Take 1 tablet (10 mg total) by mouth daily.  Dispense: 90 tablet; Refill: 1  2. Mixed hyperlipidemia Chronic.  Controlled.  Stable.  Continue TriCor 145 mg 1/2 tablet at bedtime. - Lipid Panel With LDL/HDL Ratio - fenofibrate (TRICOR) 145 MG tablet; Take 0.5 tablets (72.5 mg total) by mouth at bedtime.  Dispense: 90 tablet; Refill: 1  3. 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  Controlled.  Stable.  We will check CBC to assess  level of anemia and adjust accordingly. - CBC with Differential/Platelet

## 2020-06-29 ENCOUNTER — Ambulatory Visit: Payer: Medicare PPO

## 2020-06-29 LAB — LIPID PANEL WITH LDL/HDL RATIO
Cholesterol, Total: 145 mg/dL (ref 100–199)
HDL: 34 mg/dL — ABNORMAL LOW (ref >39)
LDL Chol Calc (NIH): 91 mg/dL (ref 0–99)
LDL/HDL Ratio: 2.7 ratio (ref 0.0–3.6)
Triglycerides: 106 mg/dL (ref 0–149)
VLDL Cholesterol Cal: 20 mg/dL (ref 5–40)

## 2020-06-29 LAB — CBC WITH DIFFERENTIAL/PLATELET
Basophils Absolute: 0 10*3/uL (ref 0.0–0.2)
Basos: 1 %
EOS (ABSOLUTE): 0.3 10*3/uL (ref 0.0–0.4)
Eos: 6 %
Hematocrit: 36.6 % — ABNORMAL LOW (ref 37.5–51.0)
Hemoglobin: 12.7 g/dL — ABNORMAL LOW (ref 13.0–17.7)
Immature Grans (Abs): 0 10*3/uL (ref 0.0–0.1)
Immature Granulocytes: 1 %
Lymphocytes Absolute: 0.7 10*3/uL (ref 0.7–3.1)
Lymphs: 16 %
MCH: 32 pg (ref 26.6–33.0)
MCHC: 34.7 g/dL (ref 31.5–35.7)
MCV: 92 fL (ref 79–97)
Monocytes Absolute: 0.5 10*3/uL (ref 0.1–0.9)
Monocytes: 10 %
Neutrophils Absolute: 3 10*3/uL (ref 1.4–7.0)
Neutrophils: 66 %
Platelets: 163 10*3/uL (ref 150–450)
RBC: 3.97 x10E6/uL — ABNORMAL LOW (ref 4.14–5.80)
RDW: 14.3 % (ref 11.6–15.4)
WBC: 4.5 10*3/uL (ref 3.4–10.8)

## 2020-07-02 ENCOUNTER — Other Ambulatory Visit: Payer: Self-pay

## 2020-07-02 ENCOUNTER — Encounter (HOSPITAL_COMMUNITY): Payer: Self-pay | Admitting: *Deleted

## 2020-07-02 ENCOUNTER — Ambulatory Visit (HOSPITAL_COMMUNITY)
Admission: RE | Admit: 2020-07-02 | Discharge: 2020-07-02 | Disposition: A | Discharge status: Home / Self Care | Payer: Medicare PPO | Source: Ambulatory Visit | Attending: Neurology | Admitting: Neurology

## 2020-07-02 DIAGNOSIS — J0100 Acute maxillary sinusitis, unspecified: Secondary | ICD-10-CM | POA: Diagnosis not present

## 2020-07-02 DIAGNOSIS — G7000 Myasthenia gravis without (acute) exacerbation: Secondary | ICD-10-CM | POA: Diagnosis not present

## 2020-07-02 DIAGNOSIS — I6782 Cerebral ischemia: Secondary | ICD-10-CM | POA: Diagnosis not present

## 2020-07-02 DIAGNOSIS — G9389 Other specified disorders of brain: Secondary | ICD-10-CM | POA: Diagnosis not present

## 2020-07-02 DIAGNOSIS — G319 Degenerative disease of nervous system, unspecified: Secondary | ICD-10-CM | POA: Diagnosis not present

## 2020-09-19 DIAGNOSIS — M1612 Unilateral primary osteoarthritis, left hip: Secondary | ICD-10-CM | POA: Diagnosis not present

## 2020-09-19 DIAGNOSIS — Z96642 Presence of left artificial hip joint: Secondary | ICD-10-CM | POA: Diagnosis not present

## 2020-11-21 IMAGING — MR MR HEAD W/O CM
7 of 10 series · 30 of 48 positions shown · non-contrast
Comparison: 07/05/2014 MRI head.

CLINICAL DATA: NeuroQuant study. History of myasthenia gravis,
memory issues.

EXAM:
MRI HEAD WITHOUT CONTRAST
TECHNIQUE: Multiplanar, multiecho pulse sequences of the brain and surrounding
structures were obtained without intravenous contrast.
Additionally, using NeuroQuant software a 3D volumetric analysis of
the brain was performed and is compared to a normative database
adjusted for age, gender and intracranial volume.

[Series 2: FLAIR · sagittal · 5.0mm · 0.47mm/px · 2 of 25 slices shown (1 of 2)]
[im 1/25]
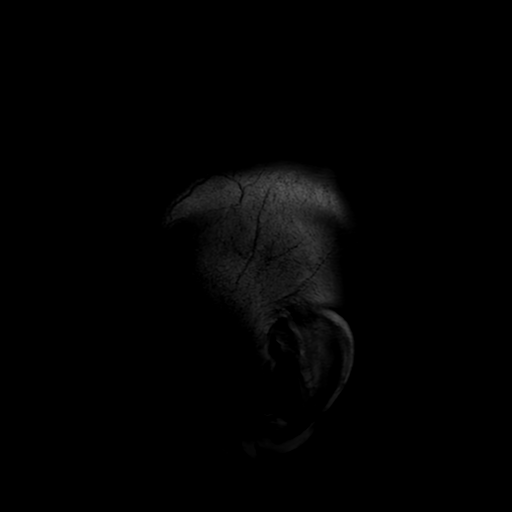
[im 25/25]
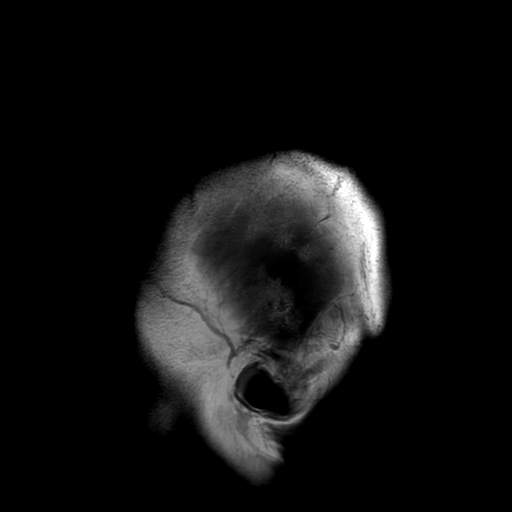

[Series 3: DWI · axial · 3.0mm · 0.94mm/px · z∈[-74,+82]mm · 9 of 106 slices shown (1 of 2)]
[im 1/106]
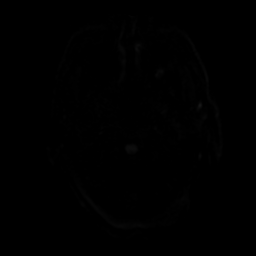
[im 14/106]
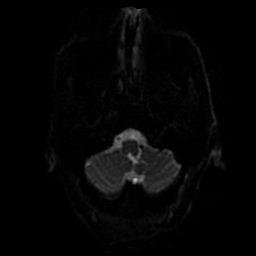
[im 27/106]
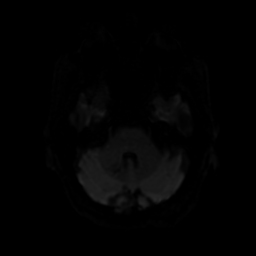
[im 40/106]
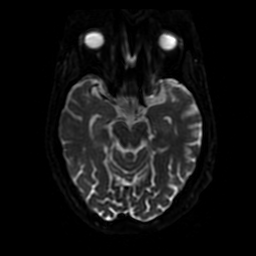
[im 53/106]
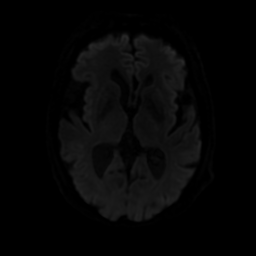
[im 66/106]
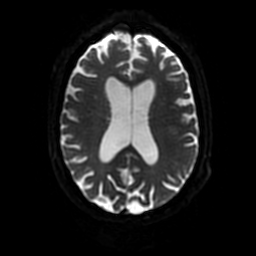
[im 79/106]
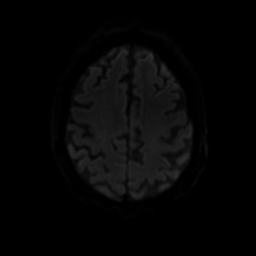
[im 92/106]
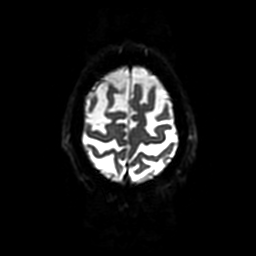
[im 106/106]
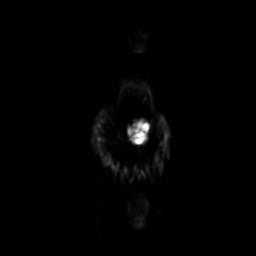

[Series 5: T2 · axial · 5.0mm · 0.23mm/px · 1 of 27 slices shown]
[im 1/27]
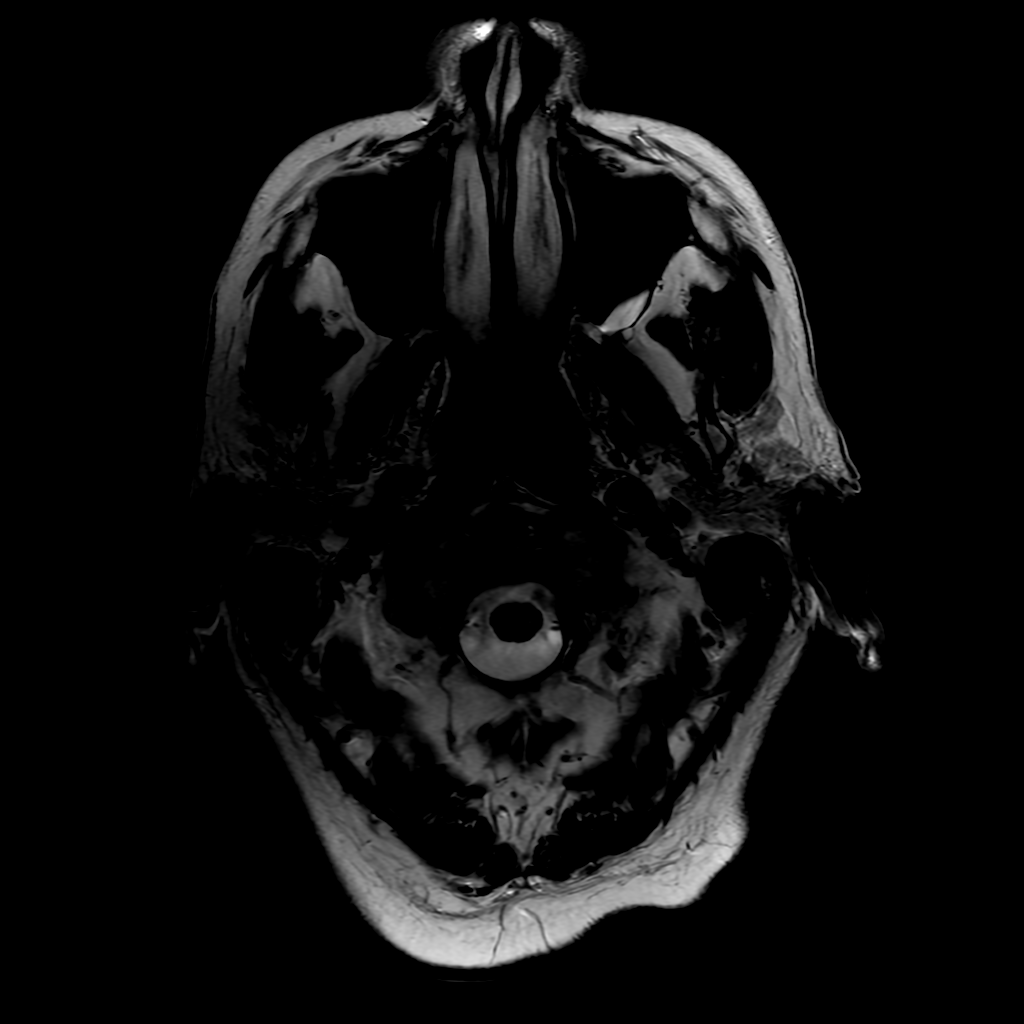

[Series 6: FLAIR · axial · 3.0mm · 0.47mm/px · z∈[-76,+92]mm · 3 of 29 slices shown (2 of 2)]
[im 1/29]
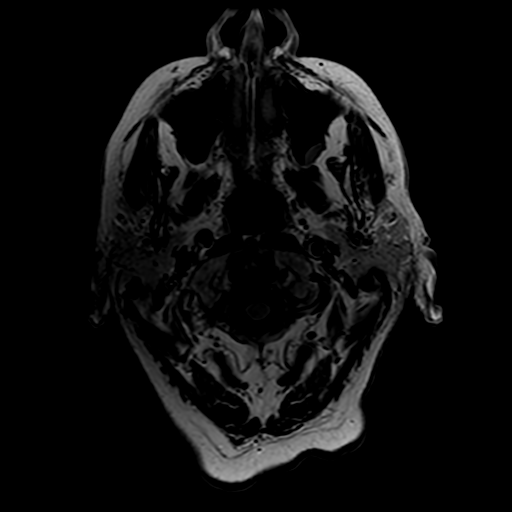
[im 15/29]
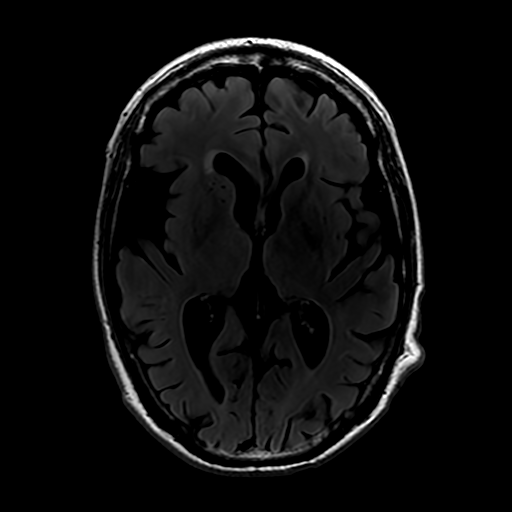
[im 29/29]
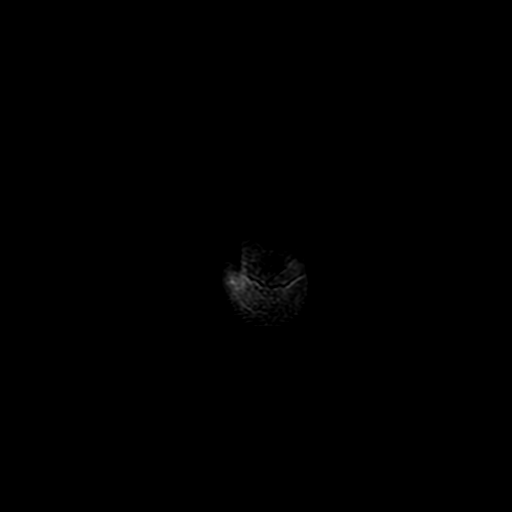

[Series 7: DWI · coronal · 4.0mm · 0.94mm/px · 7 of 74 slices shown (2 of 2)]
[im 1/74]
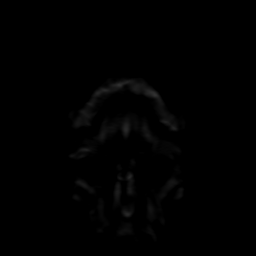
[im 13/74]
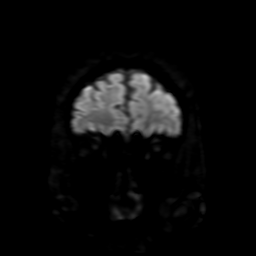
[im 25/74]
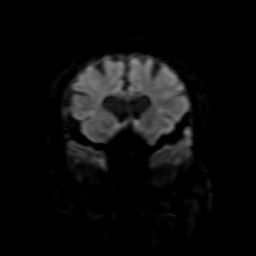
[im 37/74]
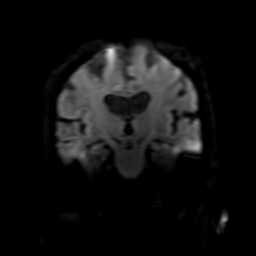
[im 49/74]
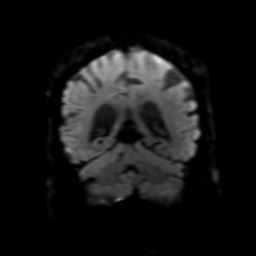
[im 61/74]
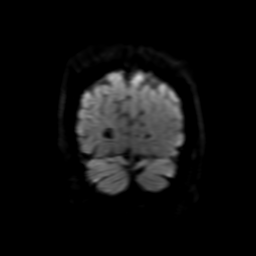
[im 74/74]
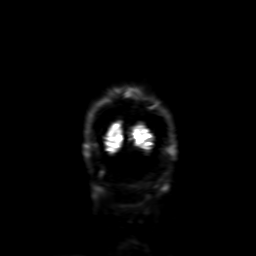

[Series 350: ADC · axial · 3.0mm · 0.94mm/px · z∈[-74,+82]mm · 5 of 53 slices shown (1 of 2)]
[im 1/53]
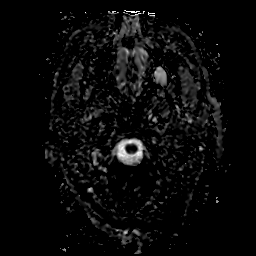
[im 14/53]
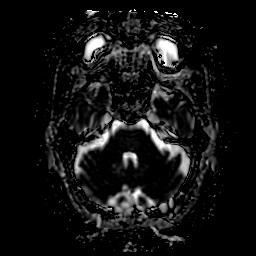
[im 27/53]
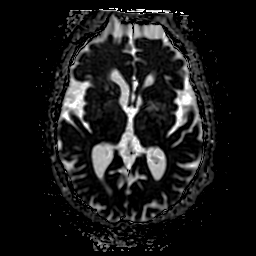
[im 40/53]
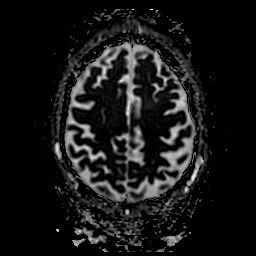
[im 53/53]
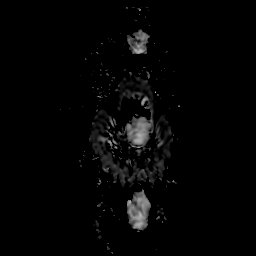

[Series 750: ADC · coronal · 4.0mm · 0.94mm/px · 3 of 37 slices shown (2 of 2)]
[im 1/37]
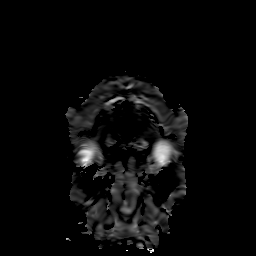
[im 19/37]
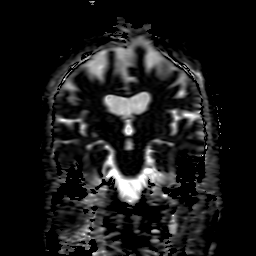
[im 37/37]
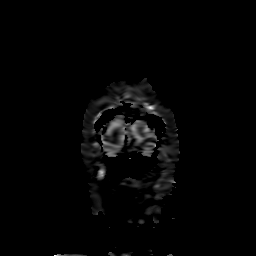

[30 of 48 positions shown; findings below may reference images not displayed]

FINDINGS: Brain: Scattered T2/FLAIR hyperintense foci involving the
periventricular and deep white matter are nonspecific however
commonly associated with chronic microvascular ischemic changes.
Diffuse parenchymal volume loss with ex vacuo dilatation. No acute
infarct or intracranial hemorrhage. No midline shift or extra-axial
fluid collection. No mass lesion.

Vascular: Normal flow voids. Small right cerebellar developmental
venous anomaly.

Skull and upper cervical spine: Normal marrow signal.

Sinuses/Orbits: Normal orbits. Sequela of bilateral lens
replacement. Minimal ethmoid and left maxillary sinus mucosal
thickening. No mastoid effusion.

Other: None.

NeuroQuant Findings:

Volumetric analysis of the brain was performed, with a fully
detailed report in [HOSPITAL] PACS. Briefly, the comparison with age and
gender matched reference reveals whole brain volume is below normal
limits for patient's age. Lateral ventricle and thalamic volumes are
within normal limits for patient's age.
IMPRESSION: 1. Diffuse cerebral atrophy with ex vacuo dilatation. Mild chronic
microvascular ischemic changes.
2. No acute intracranial process.
3. NeuroQuant volumetric analysis of the brain, see details on
[HOSPITAL] PACS.

## 2021-01-01 ENCOUNTER — Ambulatory Visit: Payer: Medicare PPO | Admitting: Family Medicine

## 2021-01-01 ENCOUNTER — Encounter: Payer: Self-pay | Admitting: Family Medicine

## 2021-01-01 ENCOUNTER — Other Ambulatory Visit: Payer: Self-pay

## 2021-01-01 VITALS — BP 120/70 | HR 60 | Ht 70.0 in | Wt 182.0 lb

## 2021-01-01 DIAGNOSIS — E782 Mixed hyperlipidemia: Secondary | ICD-10-CM

## 2021-01-01 DIAGNOSIS — R351 Nocturia: Secondary | ICD-10-CM

## 2021-01-01 DIAGNOSIS — N401 Enlarged prostate with lower urinary tract symptoms: Secondary | ICD-10-CM

## 2021-01-01 DIAGNOSIS — I10 Essential (primary) hypertension: Secondary | ICD-10-CM | POA: Diagnosis not present

## 2021-01-01 DIAGNOSIS — Z23 Encounter for immunization: Secondary | ICD-10-CM

## 2021-01-01 MED ORDER — LISINOPRIL 10 MG PO TABS: 10.0000 mg | ORAL_TABLET | Freq: Every day | ORAL | 1 refills | Status: DC

## 2021-01-01 MED ORDER — ATENOLOL 50 MG PO TABS: 50.0000 mg | ORAL_TABLET | Freq: Two times a day (BID) | ORAL | 1 refills | Status: DC

## 2021-01-01 MED ORDER — FENOFIBRATE 145 MG PO TABS: 72.5000 mg | ORAL_TABLET | Freq: Every day | ORAL | 1 refills | Status: DC

## 2021-01-01 NOTE — Progress Notes (Signed)
Date:  01/01/2021   Name:  Nicholas MartinezLarry Eugene Monroe   DOB:  1941/11/18   MRN:  [id number]   Chief Complaint: Hypertension, Hyperlipidemia, and Flu Vaccine  Hypertension This is a chronic problem. The current episode started more than 1 year ago. The problem has been gradually improving since onset. The problem is controlled. Pertinent negatives include no anxiety, blurred vision, chest pain, headaches, malaise/fatigue, neck pain, orthopnea, palpitations, peripheral edema, PND, shortness of breath or sweats. There are no associated agents to hypertension. Risk factors for coronary artery disease include dyslipidemia and post-menopausal state. Past treatments include ACE inhibitors and beta blockers. The current treatment provides moderate improvement. There are no compliance problems.  There is no history of angina, kidney disease, CAD/MI, CVA, heart failure, left ventricular hypertrophy, PVD or retinopathy. There is no history of chronic renal disease, a hypertension causing med or renovascular disease.  Hyperlipidemia This is a chronic problem. The current episode started more than 1 year ago. The problem is controlled. Recent lipid tests were reviewed and are normal. He has no history of chronic renal disease, diabetes, hypothyroidism, liver disease, obesity or nephrotic syndrome. Pertinent negatives include no chest pain, focal sensory loss, focal weakness, leg pain, myalgias or shortness of breath. The current treatment provides mild improvement of lipids. There are no compliance problems.   Benign Prostatic Hypertrophy This is a chronic problem. The current episode started more than 1 year ago. The problem has been waxing and waning since onset. Irritative symptoms include nocturia. Irritative symptoms do not include frequency or urgency. Obstructive symptoms include a weak stream. Obstructive symptoms do not include dribbling, incomplete emptying, an intermittent stream, a slower stream or straining.  Pertinent negatives include no chills, dysuria, genital pain, hematuria, hesitancy, nausea or vomiting. The symptoms are aggravated by caffeine. Past treatments include nothing. The treatment provided mild relief.    Lab Results  Component Value Date   CREATININE 0.99 03/09/2020   BUN 13 03/09/2020   NA 135 03/09/2020   K 3.9 03/09/2020   CL 105 03/09/2020   CO2 25 03/09/2020   Lab Results  Component Value Date   CHOL 145 06/28/2020   HDL 34 (L) 06/28/2020   LDLCALC 91 06/28/2020   TRIG 106 06/28/2020   CHOLHDL 4.3 01/06/2019   No results found for: TSH No results found for: HGBA1C Lab Results  Component Value Date   WBC 4.5 06/28/2020   HGB 12.7 (L) 06/28/2020   HCT 36.6 (L) 06/28/2020   MCV 92 06/28/2020   PLT 163 06/28/2020   Lab Results  Component Value Date   ALT 11 07/07/2019   AST 18 07/07/2019   ALKPHOS 40 07/07/2019   BILITOT 0.6 07/07/2019     Review of Systems  Constitutional: Negative for chills, fever and malaise/fatigue.  HENT: Negative for drooling, ear discharge, ear pain and sore throat.   Eyes: Negative for blurred vision.  Respiratory: Negative for cough, shortness of breath and wheezing.   Cardiovascular: Negative for chest pain, palpitations, orthopnea, leg swelling and PND.  Gastrointestinal: Negative for abdominal pain, blood in stool, constipation, diarrhea, nausea and vomiting.  Endocrine: Negative for polydipsia.  Genitourinary: Positive for nocturia. Negative for dysuria, frequency, hematuria, hesitancy, incomplete emptying and urgency.  Musculoskeletal: Negative for back pain, myalgias and neck pain.  Skin: Negative for rash.  Allergic/Immunologic: Negative for environmental allergies.  Neurological: Negative for dizziness, focal weakness and headaches.  Hematological: Does not bruise/bleed easily.  Psychiatric/Behavioral: Negative for suicidal ideas. The patient  is not nervous/anxious.     Patient Active Problem List   Diagnosis  Date Noted  . 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 Benign prostatic hyperplasia with nocturia 12/29/2019  . Status post total knee replacement using cement, left 10/20/2019  . Lumbar spondylosis 03/04/2019  . Primary osteoarthritis of left hip 03/04/2019  . Encounter for screening colonoscopy 10/28/2018  . Nocturia more than twice per night 12/30/2017  . Hyperlipidemia 05/15/2016  . Essential hypertension 05/15/2016  . Closed right hip fracture ([place]) 07/05/2015    No Known Allergies  Past Surgical History:  Procedure Laterality Date  . APPENDECTOMY    . COLONOSCOPY  01/2009   normal- Dr Bary Castilla  . COLONOSCOPY WITH PROPOFOL N/A 11/03/2018   Procedure: COLONOSCOPY WITH PROPOFOL;  Surgeon: Robert Bellow, MD;  Location: [facility];  Service: Endoscopy;  Laterality: N/A;  . EYE SURGERY Bilateral    cataracts  . HERNIA REPAIR    . TOTAL HIP ARTHROPLASTY Right 07/06/2015   Procedure: TOTAL HIP ARTHROPLASTY;  Surgeon: Corky Mull, MD;  Location: [facility] OR[facility];  Service: Orthopedics;  Laterality: Right;  . TOTAL HIP ARTHROPLASTY Left 03/06/2020   Procedure: TOTAL HIP ARTHROPLASTY;  Surgeon: Corky Mull, MD;  Location: [facility] OR[facility];  Service: Orthopedics;  Laterality: Left;  . TOTAL KNEE ARTHROPLASTY Left 10/20/2019   Procedure: TOTAL KNEE ARTHROPLASTY;  Surgeon: Corky Mull, MD;  Location: [facility] OR[facility];  Service: Orthopedics;  Laterality: Left;    Social History   Tobacco Use  . Smoking status: Never Smoker  . Smokeless tobacco: Never Used  . Tobacco comment: smoking cessation materials not required  Vaping Use  . Vaping Use: Never used  Substance Use Topics  . Alcohol use: Not Currently  . Drug use: No     Medication list has been reviewed and updated.  Current Meds  Medication Sig  . acetaminophen (TYLENOL) 500 MG tablet Take 1,000 mg by mouth every 8 (eight) hours as needed for moderate pain.   Marland Kitchen aspirin EC 81 MG tablet Take 81 mg by mouth daily. Swallow  whole.  Marland Kitchen atenolol (TENORMIN) 50 MG tablet Take 1 tablet (50 mg total) by mouth 2 (two) times daily.  . fenofibrate (TRICOR) 145 MG tablet Take 0.5 tablets (72.5 mg total) by mouth at bedtime.  . ferrous [id number] C-folic acid (TRINSICON / FOLTRIN) capsule Take 1 capsule by mouth 2 (two) times daily after a meal.  . folic acid (FOLVITE) 1 MG tablet Take 1 mg by mouth daily.   Marland Kitchen lisinopril (ZESTRIL) 10 MG tablet Take 1 tablet (10 mg total) by mouth daily.  . methotrexate (RHEUMATREX) 2.5 MG tablet Take 15 mg by mouth every Saturday.   . Multiple Vitamins-Minerals (CENTRUM SILVER PO) Take 1 tablet by mouth daily.  . Omega-3 Fatty Acids (FISH OIL) 1000 MG CAPS Take 2,000 mg by mouth in the morning and at bedtime.     PHQ 2/9 Scores 01/01/2021 06/28/2020 02/08/2020 12/29/2019  PHQ - 2 Score 0 0 0 0  PHQ- 9 Score 0 0 - 0    GAD 7 : Generalized Anxiety Score 06/28/2020 12/29/2019  Nervous, Anxious, on Edge 0 0  Control/stop worrying 0 0  Worry too much - different things 0 0  Trouble relaxing 0 0  Restless 0 0  Easily annoyed or irritable 0 0  Afraid - awful might happen 0 0  Total GAD 7 Score 0 0    BP Readings from Last 3 Encounters:  01/01/21  120/70  06/28/20 126/74  03/09/20 102/67    Physical Exam Vitals and nursing note reviewed.  HENT:     Head: Normocephalic.     Right Ear: Tympanic membrane, ear canal and external ear normal. There is no impacted cerumen.     Left Ear: Tympanic membrane, ear canal and external ear normal. There is no impacted cerumen.     Nose: Nose normal. No congestion or rhinorrhea.     Mouth/Throat:     Mouth: Oropharynx is clear and moist.  Eyes:     General: No scleral icterus.       Right eye: No discharge.        Left eye: No discharge.     Extraocular Movements: EOM normal.     Conjunctiva/sclera: Conjunctivae normal.     Pupils: Pupils are equal, round, and reactive to light.  Neck:     Thyroid: No thyromegaly.     Vascular: No  JVD.     Trachea: No tracheal deviation.  Cardiovascular:     Rate and Rhythm: Normal rate and regular rhythm.     Pulses: Intact distal pulses.     Heart sounds: Normal heart sounds. No murmur heard. No friction rub. No gallop.   Pulmonary:     Effort: No respiratory distress.     Breath sounds: Normal breath sounds. No wheezing or rales.  Abdominal:     General: Bowel sounds are normal.     Palpations: Abdomen is soft. There is no hepatosplenomegaly or mass.     Tenderness: There is no abdominal tenderness. There is no CVA tenderness, guarding or rebound.  Genitourinary:    Prostate: Enlarged. Not tender and no nodules present.     Rectum: Normal. Guaiac result negative. No mass.  Musculoskeletal:        General: No tenderness or edema. Normal range of motion.     Cervical back: Normal range of motion and neck supple.  Lymphadenopathy:     Cervical: No cervical adenopathy.  Skin:    General: Skin is warm.     Findings: No rash.  Neurological:     Mental Status: He is alert and oriented to person, place, and time.     Cranial Nerves: No cranial nerve deficit.     Deep Tendon Reflexes: Strength normal and reflexes are normal and symmetric.     Wt Readings from Last 3 Encounters:  01/01/21 182 lb (82.6 kg)  06/28/20 185 lb (83.9 kg)  03/06/20 180 lb (81.6 kg)    BP 120/70   Pulse 60   Ht 5\' 10"  (1.778 m)   Wt 182 lb (82.6 kg)   BMI 26.11 kg/m   Assessment and Plan: 1. Essential hypertension Chronic.  Controlled.  Stable.  Blood pressure today is 120/70.  Patient is doing well on current regimen of atenolol 50 mg 1 twice a day and lisinopril 10 mg once a day.  Will check CMP for electrolytes and GFR. - atenolol (TENORMIN) 50 MG tablet; Take 1 tablet (50 mg total) by mouth 2 (two) times daily.  Dispense: 180 tablet; Refill: 1 - lisinopril (ZESTRIL) 10 MG tablet; Take 1 tablet (10 mg total) by mouth daily.  Dispense: 90 tablet; Refill: 1 - Comprehensive Metabolic Panel  (CMET)  2. Mixed hyperlipidemia Chronic.  Controlled.  Stable.  Continue fenofibrate 145 mg daily.  Will check lipid panel for current status of triglycerides. - fenofibrate (TRICOR) 145 MG tablet; Take 0.5 tablets (72.5 mg total) by mouth at  bedtime.  Dispense: 45 tablet; Refill: 1 - Lipid Panel With LDL/HDL Ratio  3. Benign prostatic hyperplasia with nocturia Chronic.  Controlled.  Stable.  Patient did start his tamsulosin but only took it for 48 hours they did not notice a significant change so he did not take it anymore despite not having any issues and not given enough time for it to work.  Exam DRE today notes that there is mild enlargement without tenderness without nodularity and without change in consistency of the prostate.  Patient understands the limitations of PSA and we will draw PSA for evaluation.  Patient will return for reevaluation's in 6 months. - PSA  4. Need for immunization against influenza Discussed and administered. - Flu Vaccine QUAD High Dose(Fluad)

## 2021-01-02 LAB — COMPREHENSIVE METABOLIC PANEL
ALT: 20 IU/L (ref 0–44)
AST: 33 IU/L (ref 0–40)
Albumin/Globulin Ratio: 1.9 (ref 1.2–2.2)
Albumin: 4.8 g/dL — ABNORMAL HIGH (ref 3.7–4.7)
Alkaline Phosphatase: 56 IU/L (ref 44–121)
BUN/Creatinine Ratio: 13 (ref 10–24)
BUN: 15 mg/dL (ref 8–27)
Bilirubin Total: 0.8 mg/dL (ref 0.0–1.2)
CO2: 23 mmol/L (ref 20–29)
Calcium: 10.2 mg/dL (ref 8.6–10.2)
Chloride: 102 mmol/L (ref 96–106)
Creatinine, Ser: 1.13 mg/dL (ref 0.76–1.27)
GFR calc Af Amer: 71 mL/min/{1.73_m2} (ref >59)
GFR calc non Af Amer: 61 mL/min/{1.73_m2} (ref >59)
Globulin, Total: 2.5 g/dL (ref 1.5–4.5)
Glucose: 82 mg/dL (ref 65–99)
Potassium: 4.7 mmol/L (ref 3.5–5.2)
Sodium: 141 mmol/L (ref 134–144)
Total Protein: 7.3 g/dL (ref 6.0–8.5)

## 2021-01-02 LAB — LIPID PANEL WITH LDL/HDL RATIO
Cholesterol, Total: 163 mg/dL (ref 100–199)
HDL: 38 mg/dL — ABNORMAL LOW (ref >39)
LDL Chol Calc (NIH): 95 mg/dL (ref 0–99)
LDL/HDL Ratio: 2.5 ratio (ref 0.0–3.6)
Triglycerides: 171 mg/dL — ABNORMAL HIGH (ref 0–149)
VLDL Cholesterol Cal: 30 mg/dL (ref 5–40)

## 2021-01-02 LAB — PSA: Prostate Specific Ag, Serum: 1.7 ng/mL (ref 0.0–4.0)

## 2021-02-05 ENCOUNTER — Telehealth: Payer: Self-pay | Admitting: Family Medicine

## 2021-02-05 NOTE — Telephone Encounter (Signed)
PT WCB TO SCHEDULED. Due to schedule Medicare Annual Wellness Visit (AWV) either virtually or in office. Whichever the patients preference is.  Last AWV 02/07/21;   please schedule with [facility]urse Health Advisor after 02/08/21  This should be a 40 minute visit.

## 2021-02-11 ENCOUNTER — Ambulatory Visit: Payer: Self-pay

## 2021-02-13 ENCOUNTER — Ambulatory Visit (INDEPENDENT_AMBULATORY_CARE_PROVIDER_SITE_OTHER): Payer: Medicare PPO

## 2021-02-13 DIAGNOSIS — Z0000 Encounter for general adult medical examination without abnormal findings: Secondary | ICD-10-CM

## 2021-02-13 NOTE — Patient Instructions (Signed)
Nicholas Monroe , Thank you for taking time to come for your Medicare Wellness Visit. I appreciate your ongoing commitment to your health goals. Please review the following plan we discussed and let me know if I can assist you in the future.   Screening recommendations/referrals: Colonoscopy: done 11/03/18 Recommended yearly ophthalmology/optometry visit for glaucoma screening and checkup Recommended yearly dental visit for hygiene and checkup  Vaccinations: Influenza vaccine: done 01/01/21 Pneumococcal vaccine: done 07/06/18 Tdap vaccine: due Shingles vaccine: n/a   Covid-19: done 01/20/20 & 02/10/20  Advanced directives: Please bring a copy of your health care power of attorney and living will to the office at your convenience.  Conditions/risks identified: Recommend increasing physical activity and rejoining silver sneakers at a participating location   Next appointment: Follow up in one year for your annual wellness visit.   Preventive Care 14 Years and Older, Male Preventive care refers to lifestyle choices and visits with your health care provider that can promote health and wellness. What does preventive care include?  A yearly physical exam. This is also called an annual well check.  Dental exams once or twice a year.  Routine eye exams. Ask your health care provider how often you should have your eyes checked.  Personal lifestyle choices, including:  Daily care of your teeth and gums.  Regular physical activity.  Eating a healthy diet.  Avoiding tobacco and drug use.  Limiting alcohol use.  Practicing safe sex.  Taking low doses of aspirin every day.  Taking vitamin and mineral supplements as recommended by your health care provider. What happens during an annual well check? The services and screenings done by your health care provider during your annual well check will depend on your age, overall health, lifestyle risk factors, and family history of disease. Counseling   Your health care provider may ask you questions about your:  Alcohol use.  Tobacco use.  Drug use.  Emotional well-being.  Home and relationship well-being.  Sexual activity.  Eating habits.  History of falls.  Memory and ability to understand (cognition).  Work and work Statistician. Screening  You may have the following tests or measurements:  Height, weight, and BMI.  Blood pressure.  Lipid and cholesterol levels. These may be checked every 5 years, or more frequently if you are over 84 years old.  Skin check.  Lung cancer screening. You may have this screening every year starting at age 35 if you have a 30-pack-year history of smoking and currently smoke or have quit within the past 15 years.  Fecal occult blood test (FOBT) of the stool. You may have this test every year starting at age 78.  Flexible sigmoidoscopy or colonoscopy. You may have a sigmoidoscopy every 5 years or a colonoscopy every 10 years starting at age 23.  Prostate cancer screening. Recommendations will vary depending on your family history and other risks.  Hepatitis C blood test.  Hepatitis B blood test.  Sexually transmitted disease (STD) testing.  Diabetes screening. This is done by checking your blood sugar (glucose) after you have not eaten for a while (fasting). You may have this done every 1-3 years.  Abdominal aortic aneurysm (AAA) screening. You may need this if you are a current or former smoker.  Osteoporosis. You may be screened starting at age 83 if you are at high risk. Talk with your health care provider about your test results, treatment options, and if necessary, the need for more tests. Vaccines  Your health care provider may  recommend certain vaccines, such as:  Influenza vaccine. This is recommended every year.  Tetanus, diphtheria, and acellular pertussis (Tdap, Td) vaccine. You may need a Td booster every 10 years.  Zoster vaccine. You may need this after age  6.  Pneumococcal 13-valent conjugate (PCV13) vaccine. One dose is recommended after age 52.  Pneumococcal polysaccharide (PPSV23) vaccine. One dose is recommended after age 79. Talk to your health care provider about which screenings and vaccines you need and how often you need them. This information is not intended to replace advice given to you by your health care provider. Make sure you discuss any questions you have with your health care provider. Document Released: 12/28/2015 Document Revised: 08/20/2016 Document Reviewed: 10/02/2015 Elsevier Interactive Patient Education  2017 [place] Prevention in the Home Falls can cause injuries. They can happen to people of all ages. There are many things you can do to make your home safe and to help prevent falls. What can I do on the outside of my home?  Regularly fix the edges of walkways and driveways and fix any cracks.  Remove anything that might make you trip as you walk through a door, such as a raised step or threshold.  Trim any bushes or trees on the path to your home.  Use bright outdoor lighting.  Clear any walking paths of anything that might make someone trip, such as rocks or tools.  Regularly check to see if handrails are loose or broken. Make sure that both sides of any steps have handrails.  Any raised decks and porches should have guardrails on the edges.  Have any leaves, snow, or ice cleared regularly.  Use sand or salt on walking paths during winter.  Clean up any spills in your garage right away. This includes oil or grease spills. What can I do in the bathroom?  Use night lights.  Install grab bars by the toilet and in the tub and shower. Do not use towel bars as grab bars.  Use non-skid mats or decals in the tub or shower.  If you need to sit down in the shower, use a plastic, non-slip stool.  Keep the floor dry. Clean up any water that spills on the floor as soon as it happens.  Remove  soap buildup in the tub or shower regularly.  Attach bath mats securely with double-sided non-slip rug tape.  Do not have throw rugs and other things on the floor that can make you trip. What can I do in the bedroom?  Use night lights.  Make sure that you have a light by your bed that is easy to reach.  Do not use any sheets or blankets that are too big for your bed. They should not hang down onto the floor.  Have a firm chair that has side arms. You can use this for support while you get dressed.  Do not have throw rugs and other things on the floor that can make you trip. What can I do in the kitchen?  Clean up any spills right away.  Avoid walking on wet floors.  Keep items that you use a lot in easy-to-reach places.  If you need to reach something above you, use a strong step stool that has a grab bar.  Keep electrical cords out of the way.  Do not use floor polish or wax that makes floors slippery. If you must use wax, use non-skid floor wax.  Do not have throw rugs and  other things on the floor that can make you trip. What can I do with my stairs?  Do not leave any items on the stairs.  Make sure that there are handrails on both sides of the stairs and use them. Fix handrails that are broken or loose. Make sure that handrails are as long as the stairways.  Check any carpeting to make sure that it is firmly attached to the stairs. Fix any carpet that is loose or worn.  Avoid having throw rugs at the top or bottom of the stairs. If you do have throw rugs, attach them to the floor with carpet tape.  Make sure that you have a light switch at the top of the stairs and the bottom of the stairs. If you do not have them, ask someone to add them for you. What else can I do to help prevent falls?  Wear shoes that:  Do not have high heels.  Have rubber bottoms.  Are comfortable and fit you well.  Are closed at the toe. Do not wear sandals.  If you use a  stepladder:  Make sure that it is fully opened. Do not climb a closed stepladder.  Make sure that both sides of the stepladder are locked into place.  Ask someone to hold it for you, if possible.  Clearly mark and make sure that you can see:  Any grab bars or handrails.  First and last steps.  Where the edge of each step is.  Use tools that help you move around (mobility aids) if they are needed. These include:  Canes.  Walkers.  Scooters.  Crutches.  Turn on the lights when you go into a dark area. Replace any light bulbs as soon as they burn out.  Set up your furniture so you have a clear path. Avoid moving your furniture around.  If any of your floors are uneven, fix them.  If there are any pets around you, be aware of where they are.  Review your medicines with your doctor. Some medicines can make you feel dizzy. This can increase your chance of falling. Ask your doctor what other things that you can do to help prevent falls. This information is not intended to replace advice given to you by your health care provider. Make sure you discuss any questions you have with your health care provider. Document Released: 09/27/2009 Document Revised: 05/08/2016 Document Reviewed: 01/05/2015 Elsevier Interactive Patient Education  2017 [employer].

## 2021-02-13 NOTE — Progress Notes (Signed)
Subjective:   Nicholas Monroe is a 80 y.o. male who presents for Medicare Annual/Subsequent preventive examination.  Virtual Visit via Telephone Note  I connected with  [name] on 02/13/21 at 11:20 AM EST by telephone and verified that I am speaking with the correct person using two identifiers.  Location: Patient: home Provider: [facility] Persons participating in the virtual visit: [facility]   I discussed the limitations, risks, security and privacy concerns of performing an evaluation and management service by telephone and the availability of in person appointments. The patient expressed understanding and agreed to proceed.  Interactive audio and video telecommunications were attempted between this nurse and patient, however failed, due to patient having technical difficulties OR patient did not have access to video capability.  We continued and completed visit with audio only.  Some vital signs may be absent or patient reported.   Clemetine Marker, LPN    Review of Systems     Cardiac Risk Factors include: advanced age (>43men, >30 women);hypertension;male gender;dyslipidemia     Objective:    There were no vitals filed for this visit. There is no height or weight on file to calculate BMI.  Advanced Directives 02/13/2021 03/06/2020 03/06/2020 02/23/2020 02/08/2020 10/20/2019 10/20/2019  Does Patient Have a Medical Advance Directive? Yes Yes Yes Yes Yes Yes Yes  Type of Paramedic of [place];Living will [place];Living will Healthcare Power of [place];Living will [place];Living will Healthcare Power of [place]  Does patient want to make changes to medical advance directive? - No - Patient declined No - Patient declined No - Patient declined - No - Patient declined -  Copy of [place] in Chart? No - copy requested No -  copy requested No - copy requested No - copy requested No - copy requested No - copy requested -  Would patient like information on creating a medical advance directive? - No - Patient declined No - Patient declined No - Patient declined - No - Patient declined No - Patient declined    Current Medications (verified) Outpatient Encounter Medications as of 02/13/2021  Medication Sig  . acetaminophen (TYLENOL) 500 MG tablet Take 1,000 mg by mouth every 8 (eight) hours as needed for moderate pain.   Marland Kitchen aspirin EC 81 MG tablet Take 81 mg by mouth daily. Swallow whole.  Marland Kitchen atenolol (TENORMIN) 50 MG tablet Take 1 tablet (50 mg total) by mouth 2 (two) times daily.  . fenofibrate (TRICOR) 145 MG tablet Take 0.5 tablets (72.5 mg total) by mouth at bedtime.  . folic acid (FOLVITE) 1 MG tablet Take 1 mg by mouth daily.   Marland Kitchen lisinopril (ZESTRIL) 10 MG tablet Take 1 tablet (10 mg total) by mouth daily.  . methotrexate (RHEUMATREX) 2.5 MG tablet Take 15 mg by mouth every Saturday.   . Multiple Vitamins-Minerals (CENTRUM SILVER PO) Take 1 tablet by mouth daily.  . Omega-3 Fatty Acids (FISH OIL) 1000 MG CAPS Take 2,000 mg by mouth in the morning and at bedtime.   . vitamin B-12 (CYANOCOBALAMIN) 1000 MCG tablet Take 1,000 mcg by mouth daily.  . [DISCONTINUED] ferrous [id number] C-folic acid (TRINSICON / FOLTRIN) capsule Take 1 capsule by mouth 2 (two) times daily after a meal.   No facility-administered encounter medications on file as of 02/13/2021.    Allergies (verified) Patient has no known allergies.   History: Past Medical History:  Diagnosis  Date  . Arthritis   . Hyperlipidemia   . Hypertension   . 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 APPENDECTOMY    . COLONOSCOPY  01/2009   normal- Dr Bary Castilla  . COLONOSCOPY WITH PROPOFOL N/A 11/03/2018   Procedure: COLONOSCOPY WITH PROPOFOL;  Surgeon: Robert Bellow, MD;  Location:  [facility] ENDOSCOPY;  Service: Endoscopy;  Laterality: N/A;  . EYE SURGERY Bilateral    cataracts  . HERNIA REPAIR    . TOTAL HIP ARTHROPLASTY Right 07/06/2015   Procedure: TOTAL HIP ARTHROPLASTY;  Surgeon: Corky Mull, MD;  Location: [facility] ORS;  Service: Orthopedics;  Laterality: Right;  . TOTAL HIP ARTHROPLASTY Left 03/06/2020   Procedure: TOTAL HIP ARTHROPLASTY;  Surgeon: Corky Mull, MD;  Location: [facility] ORS;  Service: Orthopedics;  Laterality: Left;  . TOTAL KNEE ARTHROPLASTY Left 10/20/2019   Procedure: TOTAL KNEE ARTHROPLASTY;  Surgeon: Corky Mull, MD;  Location: [facility] ORS;  Service: Orthopedics;  Laterality: Left;   Family History  Problem Relation Age of Onset  . Healthy Father   . Diabetes Brother   . Hypertension Mother   . Colon cancer Mother 58  . Diabetes Daughter        type 1  . Diabetes Brother   . Stroke Brother    Social History   Socioeconomic History  . Marital status: Married    Spouse name: Not on file  . Number of children: 2  . Years of education: Not on file  . Highest education level: Master's degree (e.g., MA, MS, MEng, MEd, MSW, MBA)  Occupational History  . Occupation: Retired  Tobacco Use  . Smoking status: Never Smoker  . Smokeless tobacco: Never Used  . Tobacco comment: smoking cessation materials not required  Vaping Use  . Vaping Use: Never used  Substance and Sexual Activity  . Alcohol use: Not Currently  . Drug use: No  . Sexual activity: Not Currently  Other Topics Concern  . Not on file  Social History Narrative  . Not on file   Social Determinants of Health   Financial Resource Strain: Low Risk   . Difficulty of Paying Living Expenses: Not hard at all  Food Insecurity: No Food Insecurity  . Worried About Charity fundraiser in the Last Year: Never true  . Ran Out of Food in the Last Year: Never true  Transportation Needs: No Transportation Needs  . Lack of Transportation (Medical): No  . Lack of Transportation (Non-Medical):  No  Physical Activity: Inactive  . Days of Exercise per Week: 0 days  . Minutes of Exercise per Session: 0 min  Stress: No Stress Concern Present  . Feeling of Stress : Not at all  Social Connections: Moderately Integrated  . Frequency of Communication with Friends and Family: More than three times a week  . Frequency of Social Gatherings with Friends and Family: More than three times a week  . Attends Religious Services: More than 4 times per year  . Active Member of Clubs or Organizations: No  . Attends Archivist Meetings: Never  . Marital Status: Married    Tobacco Counseling Counseling given: Not Answered Comment: smoking cessation materials not required   Clinical Intake:  Pre-visit preparation completed: Yes  Pain : No/denies pain     Nutritional Risks: None Diabetes: No  How often do you need to have someone help you when you read instructions,  pamphlets, or other written materials from your doctor or pharmacy?: 1 - Never    Interpreter Needed?: No  Information entered by :: Clemetine Marker LPN   Activities of Daily Living In your present state of health, do you have any difficulty performing the following activities: 02/13/2021 03/06/2020  Hearing? N N  Comment declines hearing aids -  Vision? N N  Difficulty concentrating or making decisions? N N  Walking or climbing stairs? N Y  Dressing or bathing? N N  Doing errands, shopping? N N  Preparing Food and eating ? N -  Using the Toilet? N -  In the past six months, have you accidently leaked urine? N -  Do you have problems with loss of bowel control? N -  Managing your Medications? N -  Managing your Finances? N -  Housekeeping or managing your Housekeeping? N -  Some recent data might be hidden    Patient Care Team: Juline Patch, MD as PCP - General (Family Medicine) Vladimir Crofts, MD as Consulting Physician (Neurology) Poggi, Marshall Cork, MD as Consulting Physician (Orthopedic  Surgery)  Indicate any recent Medical Services you may have received from other than [facility] providers in the past year (date may be approximate).     Assessment:   This is a routine wellness examination for [place].  Hearing/Vision screen  Hearing Screening   125Hz  250Hz  500Hz  1000Hz  2000Hz  3000Hz  4000Hz  6000Hz  8000Hz   Right ear:           Left ear:           Comments: Pt denies hearing difficulty  Vision Screening Comments: Annual vision screenings done by [facility]   Dietary issues and exercise activities discussed: Current Exercise Habits: The patient does not participate in regular exercise at present, Exercise limited by: orthopedic condition(s)  Goals    . Exercise 150 min/wk Moderate Activity     Recommend to exercise for at least 150 minutes per week.       Depression Screen PHQ 2/9 Scores 02/13/2021 01/01/2021 06/28/2020 02/08/2020 12/29/2019 07/07/2019 01/03/2019  PHQ - 2 Score 0 0 0 0 0 0 0  PHQ- 9 Score - 0 0 - 0 0 1    Fall Risk Fall Risk  02/13/2021 06/28/2020 02/08/2020 12/29/2019 01/03/2019  Falls in the past year? 0 0 0 0 0  Number falls in past yr: 0 - 0 - 0  Injury with Fall? 0 - 0 - -  Risk for fall due to : No Fall Risks - Impaired balance/gait Impaired balance/gait -  Risk for fall due to: Comment - - - Had knee replacement -  Follow up Falls prevention discussed Falls evaluation completed Falls prevention discussed Falls evaluation completed Falls prevention discussed    FALL RISK PREVENTION PERTAINING TO THE HOME:  Any stairs in or around the home? Yes  If so, are there any without handrails? No  Home free of loose throw rugs in walkways, pet beds, electrical cords, etc? Yes  Adequate lighting in your home to reduce risk of falls? Yes   ASSISTIVE DEVICES UTILIZED TO PREVENT FALLS:  Life alert? No  Use of a cane, walker or w/c? No  Grab bars in the bathroom? Yes  Shower chair or bench in shower? Yes Elevated toilet seat or a handicapped toilet?  Yes   TIMED UP AND GO:  Was the test performed? No . Telephonic visit.   Cognitive Function: Normal cognitive status assessed by direct observation by this  Nurse Health Advisor. No abnormalities found.       6CIT Screen 01/03/2019 12/30/2017  What Year? 0 points 0 points  What month? 0 points 0 points  What time? 0 points 0 points  Count back from 20 0 points 0 points  Months in reverse 0 points 0 points  Repeat phrase 0 points 0 points  Total Score 0 0    Immunizations Immunization History  Administered Date(s) Administered  . Fluad Quad(high Dose 65+) 12/29/2019, 01/01/2021  . Influenza, High Dose Seasonal PF 10/23/2017, 10/05/2018  . Influenza,inj,Quad PF,6+ Mos 09/27/2015  . PFIZER(Purple Top)SARS-COV-2 Vaccination 01/20/2020, 02/10/2020  . Pneumococcal Conjugate-13 01/14/2017  . Pneumococcal Polysaccharide-23 07/06/2018    TDAP status: Due, Education has been provided regarding the importance of this vaccine. Advised may receive this vaccine at local pharmacy or Health Dept. Aware to provide a copy of the vaccination record if obtained from local pharmacy or Health Dept. Verbalized acceptance and understanding.  Flu Vaccine status: Up to date  Pneumococcal vaccine status: Up to date  Covid-19 vaccine status: Completed vaccines  Qualifies for Shingles Vaccine? No - pt taking methotrexate  Screening Tests Health Maintenance  Topic Date Due  . COVID-19 Vaccine (3 - Booster for Pfizer series) 08/09/2020  . Hepatitis C Screening  06/28/2021 (Originally October 23, 1941)  . TETANUS/TDAP  01/14/2027  . INFLUENZA VACCINE  Completed  . PNA vac Low Risk Adult  Completed  . HPV VACCINES  Aged Out    Health Maintenance  Health Maintenance Due  Topic Date Due  . COVID-19 Vaccine (3 - Booster for Pfizer series) 08/09/2020    Colorectal cancer screening: No longer required.   Lung Cancer Screening: (Low Dose CT Chest recommended if Age 22-80 years, 30 pack-year currently  smoking OR have quit w/in 15years.) does not qualify.   Additional Screening:  Hepatitis C Screening: does qualify; postponed  Vision Screening: Recommended annual ophthalmology exams for early detection of glaucoma and other disorders of the eye. Is the patient up to date with their annual eye exam?  No  Who is the provider or what is the name of the office in which the patient attends annual eye exams? [place] Screening: Recommended annual dental exams for proper oral hygiene  Community Resource Referral / Chronic Care Management: CRR required this visit?  No   CCM required this visit?  No      Plan:     I have personally reviewed and noted the following in the patient's chart:   . Medical and social history . Use of alcohol, tobacco or illicit drugs  . Current medications and supplements . Functional ability and status . Nutritional status . Physical activity . Advanced directives . List of other physicians . Hospitalizations, surgeries, and ER visits in previous 12 months . Vitals . Screenings to include cognitive, depression, and falls . Referrals and appointments  In addition, I have reviewed and discussed with patient certain preventive protocols, quality metrics, and best practice recommendations. A written personalized care plan for preventive services as well as general preventive health recommendations were provided to patient.     Clemetine Marker, LPN   06/19/8114   Nurse Notes: none

## 2021-05-30 DIAGNOSIS — L570 Actinic keratosis: Secondary | ICD-10-CM | POA: Diagnosis not present

## 2021-05-30 DIAGNOSIS — L814 Other melanin hyperpigmentation: Secondary | ICD-10-CM | POA: Diagnosis not present

## 2021-05-30 DIAGNOSIS — X32XXXS Exposure to sunlight, sequela: Secondary | ICD-10-CM | POA: Diagnosis not present

## 2021-05-30 DIAGNOSIS — D1801 Hemangioma of skin and subcutaneous tissue: Secondary | ICD-10-CM | POA: Diagnosis not present

## 2021-05-31 DIAGNOSIS — H9193 Unspecified hearing loss, bilateral: Secondary | ICD-10-CM | POA: Diagnosis not present

## 2021-05-31 DIAGNOSIS — R4189 Other symptoms and signs involving cognitive functions and awareness: Secondary | ICD-10-CM | POA: Diagnosis not present

## 2021-05-31 DIAGNOSIS — G7000 Myasthenia gravis without (acute) exacerbation: Secondary | ICD-10-CM | POA: Diagnosis not present

## 2021-06-18 ENCOUNTER — Encounter: Payer: Self-pay | Admitting: Family Medicine

## 2021-06-18 ENCOUNTER — Ambulatory Visit: Payer: Medicare PPO | Admitting: Family Medicine

## 2021-06-18 ENCOUNTER — Other Ambulatory Visit: Payer: Self-pay

## 2021-06-18 VITALS — BP 120/62 | HR 72 | Ht 70.0 in | Wt 185.0 lb

## 2021-06-18 DIAGNOSIS — E782 Mixed hyperlipidemia: Secondary | ICD-10-CM

## 2021-06-18 DIAGNOSIS — R351 Nocturia: Secondary | ICD-10-CM | POA: Diagnosis not present

## 2021-06-18 DIAGNOSIS — N401 Enlarged prostate with lower urinary tract symptoms: Secondary | ICD-10-CM | POA: Diagnosis not present

## 2021-06-18 DIAGNOSIS — I10 Essential (primary) hypertension: Secondary | ICD-10-CM | POA: Diagnosis not present

## 2021-06-18 LAB — POCT URINALYSIS DIPSTICK
Bilirubin, UA: NEGATIVE
Blood, UA: NEGATIVE
Glucose, UA: NEGATIVE
Ketones, UA: NEGATIVE
Leukocytes, UA: NEGATIVE
Nitrite, UA: NEGATIVE
Protein, UA: NEGATIVE
Spec Grav, UA: 1.02 (ref 1.010–1.025)
Urobilinogen, UA: 0.2 E.U./dL
pH, UA: 5 (ref 5.0–8.0)

## 2021-06-18 MED ORDER — TAMSULOSIN HCL 0.4 MG PO CAPS: 0.4000 mg | ORAL_CAPSULE | Freq: Every day | ORAL | 1 refills | Status: DC

## 2021-06-18 MED ORDER — FENOFIBRATE 145 MG PO TABS: 72.5000 mg | ORAL_TABLET | Freq: Every day | ORAL | 1 refills | Status: DC

## 2021-06-18 MED ORDER — LISINOPRIL 10 MG PO TABS
10.0000 mg | ORAL_TABLET | Freq: Every day | ORAL | 1 refills | Status: DC
Start: 2021-06-18 — End: 2021-12-20

## 2021-06-18 MED ORDER — ATENOLOL 50 MG PO TABS: 50.0000 mg | ORAL_TABLET | Freq: Two times a day (BID) | ORAL | 1 refills | Status: DC

## 2021-06-18 NOTE — Progress Notes (Signed)
Date:  06/18/2021   Name:  Nicholas Monroe   DOB:  11-Apr-1941   MRN:  [id number]   Chief Complaint: Hypertension, Hyperlipidemia, and Benign Prostatic Hypertrophy  Hypertension This is a chronic problem. The current episode started more than 1 year ago. The problem has been waxing and waning since onset. The problem is controlled. Pertinent negatives include no anxiety, blurred vision, chest pain, headaches, malaise/fatigue, neck pain, orthopnea, palpitations, peripheral edema, PND, shortness of breath or sweats. There are no associated agents to hypertension. There are no known risk factors for coronary artery disease. Past treatments include ACE inhibitors and beta blockers. The current treatment provides moderate improvement. There are no compliance problems.  There is no history of angina, kidney disease, CAD/MI, CVA, heart failure, left ventricular hypertrophy, PVD or retinopathy. There is no history of chronic renal disease, a hypertension causing med or renovascular disease.  Hyperlipidemia This is a chronic problem. The problem is controlled. Recent lipid tests were reviewed and are normal. He has no history of chronic renal disease. Pertinent negatives include no chest pain, focal sensory loss, focal weakness, leg pain, myalgias or shortness of breath. Current antihyperlipidemic treatment includes statins. The current treatment provides moderate improvement of lipids. There are no compliance problems.  Risk factors for coronary artery disease include hypertension.  Benign Prostatic Hypertrophy This is a chronic problem. The current episode started more than 1 year ago. The problem has been gradually improving since onset. Irritative symptoms include frequency, nocturia and urgency. Obstructive symptoms include a weak stream. Obstructive symptoms do not include dribbling, incomplete emptying, an intermittent stream, a slower stream or straining. Pertinent negatives include no chills, dysuria,  hematuria or nausea. Past treatments include tamsulosin. The treatment provided moderate relief.   Lab Results  Component Value Date   CREATININE 1.13 01/01/2021   BUN 15 01/01/2021   NA 141 01/01/2021   K 4.7 01/01/2021   CL 102 01/01/2021   CO2 23 01/01/2021   Lab Results  Component Value Date   CHOL 163 01/01/2021   HDL 38 (L) 01/01/2021   LDLCALC 95 01/01/2021   TRIG 171 (H) 01/01/2021   CHOLHDL 4.3 01/06/2019   No results found for: TSH No results found for: HGBA1C Lab Results  Component Value Date   WBC 4.5 06/28/2020   HGB 12.7 (L) 06/28/2020   HCT 36.6 (L) 06/28/2020   MCV 92 06/28/2020   PLT 163 06/28/2020   Lab Results  Component Value Date   ALT 20 01/01/2021   AST 33 01/01/2021   ALKPHOS 56 01/01/2021   BILITOT 0.8 01/01/2021     Review of Systems  Constitutional:  Negative for chills, fever and malaise/fatigue.  HENT:  Negative for drooling, ear discharge, ear pain and sore throat.   Eyes:  Negative for blurred vision.  Respiratory:  Negative for cough, shortness of breath and wheezing.   Cardiovascular:  Negative for chest pain, palpitations, orthopnea, leg swelling and PND.  Gastrointestinal:  Negative for abdominal pain, blood in stool, constipation, diarrhea and nausea.  Endocrine: Negative for polydipsia.  Genitourinary:  Positive for frequency, nocturia and urgency. Negative for dysuria, hematuria and incomplete emptying.  Musculoskeletal:  Negative for back pain, myalgias and neck pain.  Skin:  Negative for rash.  Allergic/Immunologic: Negative for environmental allergies.  Neurological:  Negative for dizziness, focal weakness and headaches.  Hematological:  Does not bruise/bleed easily.  Psychiatric/Behavioral:  Negative for suicidal ideas. The patient is not nervous/anxious.    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   Benign prostatic hyperplasia with nocturia 12/29/2019    Status post total knee replacement using cement, left 10/20/2019   Lumbar spondylosis 03/04/2019   Primary osteoarthritis of left hip 03/04/2019   Encounter for screening colonoscopy 10/28/2018   Nocturia more than twice per night 12/30/2017   Hyperlipidemia 05/15/2016   Essential hypertension 05/15/2016   Closed right hip fracture ([place]) 07/05/2015    No Known Allergies  Past Surgical History:  Procedure Laterality Date   APPENDECTOMY     COLONOSCOPY  01/2009   normal- Dr Bary Castilla   COLONOSCOPY WITH PROPOFOL N/A 11/03/2018   Procedure: COLONOSCOPY WITH PROPOFOL;  Surgeon: Robert Bellow, MD;  Location: [facility] ENDOSCOPY;  Service: Endoscopy;  Laterality: N/A;   EYE SURGERY Bilateral    cataracts   HERNIA REPAIR     TOTAL HIP ARTHROPLASTY Right 07/06/2015   Procedure: TOTAL HIP ARTHROPLASTY;  Surgeon: Corky Mull, MD;  Location: [facility] ORS;  Service: Orthopedics;  Laterality: Right;   TOTAL HIP ARTHROPLASTY Left 03/06/2020   Procedure: TOTAL HIP ARTHROPLASTY;  Surgeon: Corky Mull, MD;  Location: [facility] ORS;  Service: Orthopedics;  Laterality: Left;   TOTAL KNEE ARTHROPLASTY Left 10/20/2019   Procedure: TOTAL KNEE ARTHROPLASTY;  Surgeon: Corky Mull, MD;  Location: [facility] ORS;  Service: Orthopedics;  Laterality: Left;    Social History   Tobacco Use   Smoking status: Never   Smokeless tobacco: Never   Tobacco comments:    smoking cessation materials not required  Vaping Use   Vaping Use: Never used  Substance Use Topics   Alcohol use: Not Currently   Drug use: No     Medication list has been reviewed and updated.  Current Meds  Medication Sig   acetaminophen (TYLENOL) 500 MG tablet Take 1,000 mg by mouth every 8 (eight) hours as needed for moderate pain.    aspirin EC 81 MG tablet Take 81 mg by mouth daily. Swallow whole.   atenolol (TENORMIN) 50 MG tablet Take 1 tablet (50 mg total) by mouth 2 (two) times daily.   fenofibrate (TRICOR) 145 MG tablet Take 0.5 tablets  (72.5 mg total) by mouth at bedtime.   folic acid (FOLVITE) 1 MG tablet Take 1 mg by mouth daily.    lisinopril (ZESTRIL) 10 MG tablet Take 1 tablet (10 mg total) by mouth daily.   methotrexate (RHEUMATREX) 2.5 MG tablet Take 15 mg by mouth every Saturday.    Multiple Vitamins-Minerals (CENTRUM SILVER PO) Take 1 tablet by mouth daily.   Omega-3 Fatty Acids (FISH OIL) 1000 MG CAPS Take 2,000 mg by mouth in the morning and at bedtime.    tamsulosin (FLOMAX) 0.4 MG CAPS capsule Take 0.4 mg by mouth.   vitamin B-12 (CYANOCOBALAMIN) 1000 MCG tablet Take 1,000 mcg by mouth daily.    PHQ 2/9 Scores 06/18/2021 02/13/2021 01/01/2021 06/28/2020  PHQ - 2 Score 0 0 0 0  PHQ- 9 Score 0 - 0 0    GAD 7 : Generalized Anxiety Score 06/18/2021 06/28/2020 12/29/2019  Nervous, Anxious, on Edge 0 0 0  Control/stop worrying 0 0 0  Worry too much - different things 0 0 0  Trouble relaxing 0 0 0  Restless 0 0 0  Easily annoyed or irritable 0 0 0  Afraid - awful might happen 0 0 0  Total GAD 7 Score 0 0 0    BP Readings  from Last 3 Encounters:  06/18/21 120/62  01/01/21 120/70  06/28/20 126/74    Physical Exam Vitals and nursing note reviewed.  HENT:     Head: Normocephalic.     Right Ear: Tympanic membrane, ear canal and external ear normal.     Left Ear: Tympanic membrane, ear canal and external ear normal.     Nose: Nose normal. No congestion or rhinorrhea.  Eyes:     General: No scleral icterus.       Right eye: No discharge.        Left eye: No discharge.     Conjunctiva/sclera: Conjunctivae normal.     Pupils: Pupils are equal, round, and reactive to light.  Neck:     Thyroid: No thyromegaly.     Vascular: No JVD.     Trachea: No tracheal deviation.  Cardiovascular:     Rate and Rhythm: Normal rate and regular rhythm.     Heart sounds: Normal heart sounds. No murmur heard.   No friction rub. No gallop.  Pulmonary:     Effort: No respiratory distress.     Breath sounds: Normal breath sounds.  No wheezing, rhonchi or rales.  Abdominal:     General: Bowel sounds are normal.     Palpations: Abdomen is soft. There is no mass.     Tenderness: There is no abdominal tenderness. There is no guarding or rebound.  Musculoskeletal:        General: No tenderness. Normal range of motion.     Cervical back: Normal range of motion and neck supple.  Lymphadenopathy:     Cervical: No cervical adenopathy.  Skin:    General: Skin is warm.     Findings: No rash.  Neurological:     Mental Status: He is alert and oriented to person, place, and time.     Cranial Nerves: No cranial nerve deficit.     Deep Tendon Reflexes: Reflexes are normal and symmetric.    Wt Readings from Last 3 Encounters:  06/18/21 185 lb (83.9 kg)  01/01/21 182 lb (82.6 kg)  06/28/20 185 lb (83.9 kg)    BP 120/62   Pulse 72   Ht 5\' 10"  (1.778 m)   Wt 185 lb (83.9 kg)   BMI 26.54 kg/m   Assessment and Plan:  1. Essential hypertension Chronic.  Controlled.  Stable.  Blood pressure 120/62.  Patient is doing well on current medication and this will be continued as atenolol 50 mg 1 twice a day and lisinopril 10 mg once a day.  Review of renal panel is acceptable. - atenolol (TENORMIN) 50 MG tablet; Take 1 tablet (50 mg total) by mouth 2 (two) times daily.  Dispense: 180 tablet; Refill: 1 - lisinopril (ZESTRIL) 10 MG tablet; Take 1 tablet (10 mg total) by mouth daily.  Dispense: 90 tablet; Refill: 1  2. Mixed hyperlipidemia Chronic.  Controlled.  Stable.  We will continue fenofibrate 145 mg 1/2 tablet daily.  We will check current lipid panel. - fenofibrate (TRICOR) 145 MG tablet; Take 0.5 tablets (72.5 mg total) by mouth at bedtime.  Dispense: 45 tablet; Refill: 1 - Lipid Panel With LDL/HDL Ratio  3. Benign prostatic hyperplasia with nocturia Chronic.  Stable.  Mildly symptomatic but controlled with only 1 episode of nocturia at night and increased strain upon resuming tamsulosin 0.4 mg once a day.  We will  continue this.  We have discussed finasteride and the possibility of initiating this.  Also patient has a  history of interstitial cystitis but I think that might be more of a prostatitis because he has not had any further episodes.  We will get repeat urinalysis today to reassess and we have discussed the possibility of referral to urology. - tamsulosin (FLOMAX) 0.4 MG CAPS capsule; Take 1 capsule (0.4 mg total) by mouth daily.  Dispense: 90 capsule; Refill: 1 - POCT urinalysis dipstick

## 2021-06-18 NOTE — Patient Instructions (Signed)
Finasteride (Proscar) Oral Tablets What is this medication? FINASTERIDE (fi NAS teer ide) is used to treat benign prostatic hyperplasia (BPH) in men. This is a condition that causes you to have an enlarged prostate. This medicine helps to control your symptoms, decrease urinary retention, and reduces your risk of needing surgery. When used in combination with certainother medicines, this drug can slow down the progression of your disease. This medicine may be used for other purposes; ask your health care provider orpharmacist if you have questions. COMMON BRAND NAME(S): Proscar What should I tell my care team before I take this medication? They need to know if you have any of these conditions: liver disease an unusual or allergic reaction to finasteride, other medicines, foods, dyes, or preservatives pregnant or trying to get pregnant breast-feeding How should I use this medication? Take this medicine by mouth with water. Take it as directed on the prescription label at the same time every day. You can take it with or without food. If it upsets your stomach, take it with food. Keep taking it unless your health careprovider tells you to stop. Talk to your health care provider about the use of this medicine in children.Special care may be needed. Overdosage: If you think you have taken too much of this medicine contact apoison control center or emergency room at once. NOTE: This medicine is only for you. Do not share this medicine with others. What if I miss a dose? If you miss a dose, take it as soon as you can. If it is almost time for yournext dose, take only that dose. Do not take double or extra doses. What may interact with this medication? saw palmetto or other dietary supplements This list may not describe all possible interactions. Give your health care provider a list of all the medicines, herbs, non-prescription drugs, or dietary supplements you use. Also tell them if you smoke, drink  alcohol, or use illegaldrugs. Some items may interact with your medicine. What should I watch for while using this medication? Visit your health care provider for regular checks on your progress. It may besome time before you see the benefit from this medicine. You may need blood work while you are taking this medicine. For example, your health care provider may have you take a blood test called PSA for the screening of prostate cancer. Make sure your health care provider knows you aretaking this medicine before you take a PSA test. Talk to your health care provider about your risk of cancer. You may be more atrisk for certain types of cancer if you take this medicine. Do not donate blood while you are taking this medicine. This will prevent giving this medicine to a pregnant male through a blood transfusion. Ask your doctor or health care professional when it is safe to donate blood after youstop taking this medicine. Women who are pregnant or may get pregnant must not handle broken or crushed finasteride tablets. The active ingredient could harm the unborn baby. If a pregnant woman comes into contact with broken or crushed tablets she should check with her health care provider. Exposure to whole tablets is not expectedto cause harm as long as they are not swallowed. What side effects may I notice from receiving this medication? Side effects that you should report to your doctor or health care professionalas soon as possible: allergic reactions (skin rash, itching or hives; swelling of the face, lips, or tongue) breast discharge breast swelling, tenderness depressed mood pain in the testicles  Side effects that usually do not require medical attention (report to yourdoctor or health care professional if they continue or are bothersome): blood in semen changes in sex drive or performance reduced amount of semen released during sex This list may not describe all possible side effects. Call your  doctor for medical advice about side effects. You may report side effects to FDA at1-800-FDA-1088. Where should I keep my medication? Keep out of the reach of children and pets. Store at room temperature below 30 degrees C (86 degrees F). Protect from light. Keep the container tightly closed. Get rid of any unused medicine afterthe expiration date. To get rid of medicines that are no longer needed or have expired: Take the medicine to a medicine take-back program. Check with your pharmacy or law enforcement to find a location. If you cannot return the medicine, ask your pharmacist or health care provider how to get rid of this medicine safely. NOTE: This sheet is a summary. It may not cover all possible information. If you have questions about this medicine, talk to your doctor, pharmacist, orhealth care provider.  2022 Elsevier/Gold Standard (2020-08-15 10:54:36) Benign Prostatic Hyperplasia  Benign prostatic hyperplasia (BPH) is an enlarged prostate gland that is caused by the normal aging process and not by cancer. The prostate is a walnut-sized gland that is involved in the production of semen. It is located in front of the rectum and below the bladder. The bladder stores urine and the urethra is the tube that carries the urine out of the body. The prostate may get bigger asa man gets older. An enlarged prostate can press on the urethra. This can make it harder to pass urine. The build-up of urine in the bladder can cause infection. Back pressure and infection may progress to bladder damage and kidney (renal) failure. What are the causes? This condition is part of a normal aging process. However, not all men develop problems from this condition. If the prostate enlarges away from the urethra, urine flow will not be blocked. If it enlarges toward the urethra andcompresses it, there will be problems passing urine. What increases the risk? This condition is more likely to develop in men over the  age of 2 years. What are the signs or symptoms? Symptoms of this condition include: Getting up often during the night to urinate. Needing to urinate frequently during the day. Difficulty starting urine flow. Decrease in size and strength of your urine stream. Leaking (dribbling) after urinating. Inability to pass urine. This needs immediate treatment. Inability to completely empty your bladder. Pain when you pass urine. This is more common if there is also an infection. Urinary tract infection (UTI). How is this diagnosed? This condition is diagnosed based on your medical history, a physical exam, and your symptoms. Tests will also be done, such as: A post-void bladder scan. This measures any amount of urine that may remain in your bladder after you finish urinating. A digital rectal exam. In a rectal exam, your health care provider checks your prostate by putting a lubricated, gloved finger into your rectum to feel the back of your prostate gland. This exam detects the size of your gland and any abnormal lumps or growths. An exam of your urine (urinalysis). A prostate specific antigen (PSA) screening. This is a blood test used to screen for prostate cancer. An ultrasound. This test uses sound waves to electronically produce a picture of your prostate gland. Your health care provider may refer you to a specialist in  kidney and prostate diseases (urologist). How is this treated? Once symptoms begin, your health care provider will monitor your condition (active surveillance or watchful waiting). Treatment for this condition will depend on the severity of your condition. Treatment may include: Observation and yearly exams. This may be the only treatment needed if your condition and symptoms are mild. Medicines to relieve your symptoms, including: Medicines to shrink the prostate. Medicines to relax the muscle of the prostate. Surgery in severe cases. Surgery may include: Prostatectomy. In  this procedure, the prostate tissue is removed completely through an open incision or with a laparoscope or robotics. Transurethral resection of the prostate (TURP). In this procedure, a tool is inserted through the opening at the tip of the penis (urethra). It is used to cut away tissue of the inner core of the prostate. The pieces are removed through the same opening of the penis. This removes the blockage. Transurethral incision (TUIP). In this procedure, small cuts are made in the prostate. This lessens the prostate's pressure on the urethra. Transurethral microwave thermotherapy (TUMT). This procedure uses microwaves to create heat. The heat destroys and removes a small amount of prostate tissue. Transurethral needle ablation (TUNA). This procedure uses radio frequencies to destroy and remove a small amount of prostate tissue. Interstitial laser coagulation (Atkins). This procedure uses a laser to destroy and remove a small amount of prostate tissue. Transurethral electrovaporization (TUVP). This procedure uses electrodes to destroy and remove a small amount of prostate tissue. Prostatic urethral lift. This procedure inserts an implant to push the lobes of the prostate away from the urethra. Follow these instructions at home: Take over-the-counter and prescription medicines only as told by your health care provider. Monitor your symptoms for any changes. Contact your health care provider with any changes. Avoid drinking large amounts of liquid before going to bed or out in public. Avoid or reduce how much caffeine or alcohol you drink. Give yourself time when you urinate. Keep all follow-up visits as told by your health care provider. This is important. Contact a health care provider if: You have unexplained back pain. Your symptoms do not get better with treatment. You develop side effects from the medicine you are taking. Your urine becomes very dark or has a bad smell. Your lower abdomen  becomes distended and you have trouble passing your urine. Get help right away if: You have a fever or chills. You suddenly cannot urinate. You feel lightheaded, or very dizzy, or you faint. There are large amounts of blood or clots in the urine. Your urinary problems become hard to manage. You develop moderate to severe low back or flank pain. The flank is the side of your body between the ribs and the hip. These symptoms may represent a serious problem that is an emergency. Do not wait to see if the symptoms will go away. Get medical help right away. Call your local emergency services (911 in the [place] Do not drive yourself to the hospital. Summary Benign prostatic hyperplasia (BPH) is an enlarged prostate that is caused by the normal aging process and not by cancer. An enlarged prostate can press on the urethra. This can make it hard to pass urine. This condition is part of a normal aging process and is more likely to develop in men over the age of 45 years. Get help right away if you suddenly cannot urinate. This information is not intended to replace advice given to you by your health care provider. Make sure you discuss  any questions you have with your healthcare provider. Document Revised: 08/09/2020 Document Reviewed: 08/09/2020 Elsevier Patient Education  [place].

## 2021-06-19 LAB — LIPID PANEL WITH LDL/HDL RATIO
Cholesterol, Total: 108 mg/dL (ref 100–199)
HDL: 34 mg/dL — ABNORMAL LOW (ref >39)
LDL Chol Calc (NIH): 60 mg/dL (ref 0–99)
LDL/HDL Ratio: 1.8 ratio (ref 0.0–3.6)
Triglycerides: 61 mg/dL (ref 0–149)
VLDL Cholesterol Cal: 14 mg/dL (ref 5–40)

## 2021-12-19 ENCOUNTER — Ambulatory Visit: Payer: Self-pay | Admitting: Family Medicine

## 2021-12-20 ENCOUNTER — Encounter: Payer: Self-pay | Admitting: Family Medicine

## 2021-12-20 ENCOUNTER — Other Ambulatory Visit: Payer: Self-pay

## 2021-12-20 ENCOUNTER — Ambulatory Visit: Payer: Medicare PPO | Admitting: Family Medicine

## 2021-12-20 VITALS — BP 124/80 | HR 60 | Ht 70.0 in | Wt 186.0 lb

## 2021-12-20 DIAGNOSIS — Z23 Encounter for immunization: Secondary | ICD-10-CM | POA: Diagnosis not present

## 2021-12-20 DIAGNOSIS — N401 Enlarged prostate with lower urinary tract symptoms: Secondary | ICD-10-CM

## 2021-12-20 DIAGNOSIS — I10 Essential (primary) hypertension: Secondary | ICD-10-CM | POA: Diagnosis not present

## 2021-12-20 DIAGNOSIS — E782 Mixed hyperlipidemia: Secondary | ICD-10-CM

## 2021-12-20 DIAGNOSIS — R351 Nocturia: Secondary | ICD-10-CM | POA: Diagnosis not present

## 2021-12-20 MED ORDER — TAMSULOSIN HCL 0.4 MG PO CAPS: 0.4000 mg | ORAL_CAPSULE | Freq: Every day | ORAL | 1 refills | Status: DC

## 2021-12-20 MED ORDER — FENOFIBRATE 145 MG PO TABS: 72.5000 mg | ORAL_TABLET | Freq: Every day | ORAL | 1 refills | Status: DC

## 2021-12-20 MED ORDER — LISINOPRIL 10 MG PO TABS: 10.0000 mg | ORAL_TABLET | Freq: Every day | ORAL | 1 refills | Status: DC

## 2021-12-20 MED ORDER — ATENOLOL 50 MG PO TABS: 50.0000 mg | ORAL_TABLET | Freq: Two times a day (BID) | ORAL | 1 refills | Status: DC

## 2021-12-20 NOTE — Progress Notes (Signed)
Date:  12/20/2021   Name:  Nicholas Monroe   DOB:  07-25-1941   MRN:  [id number]   Chief Complaint: Benign Prostatic Hypertrophy, Hypertension, Hyperlipidemia, and Flu Vaccine  Benign Prostatic Hypertrophy This is a chronic problem. The current episode started more than 1 year ago. The problem has been gradually improving since onset. Irritative symptoms do not include frequency, nocturia or urgency. Obstructive symptoms do not include dribbling, incomplete emptying, an intermittent stream, a slower stream, straining or a weak stream. Pertinent negatives include no chills, dysuria, hematuria, hesitancy or nausea. Nothing aggravates the symptoms. Past treatments include tamsulosin. The treatment provided moderate relief.  Hypertension This is a chronic problem. The current episode started more than 1 year ago. The problem has been gradually improving since onset. The problem is controlled. Pertinent negatives include no anxiety, blurred vision, chest pain, headaches, malaise/fatigue, neck pain, orthopnea, palpitations, peripheral edema, PND, shortness of breath or sweats. There are no associated agents to hypertension. There are no known risk factors for coronary artery disease. Past treatments include ACE inhibitors and beta blockers. The current treatment provides moderate improvement. There are no compliance problems.  There is no history of angina, kidney disease, CAD/MI, CVA, heart failure, left ventricular hypertrophy, PVD or retinopathy. There is no history of chronic renal disease, a hypertension causing med or renovascular disease.  Hyperlipidemia This is a chronic problem. The current episode started more than 1 year ago. The problem is controlled. Recent lipid tests were reviewed and are normal. He has no history of chronic renal disease. Pertinent negatives include no chest pain, myalgias or shortness of breath. Current antihyperlipidemic treatment includes statins. The current treatment  provides moderate improvement of lipids. There are no compliance problems.  There are no known risk factors for coronary artery disease.   Lab Results  Component Value Date   NA 141 01/01/2021   K 4.7 01/01/2021   CO2 23 01/01/2021   GLUCOSE 82 01/01/2021   BUN 15 01/01/2021   CREATININE 1.13 01/01/2021   CALCIUM 10.2 01/01/2021   GFRNONAA 61 01/01/2021   Lab Results  Component Value Date   CHOL 108 06/18/2021   HDL 34 (L) 06/18/2021   LDLCALC 60 06/18/2021   TRIG 61 06/18/2021   CHOLHDL 4.3 01/06/2019   No results found for: TSH No results found for: HGBA1C Lab Results  Component Value Date   WBC 4.5 06/28/2020   HGB 12.7 (L) 06/28/2020   HCT 36.6 (L) 06/28/2020   MCV 92 06/28/2020   PLT 163 06/28/2020   Lab Results  Component Value Date   ALT 20 01/01/2021   AST 33 01/01/2021   ALKPHOS 56 01/01/2021   BILITOT 0.8 01/01/2021   No results found for: 25OHVITD2, 25OHVITD3, VD25OH   Review of Systems  Constitutional:  Negative for chills, fever and malaise/fatigue.  HENT:  Negative for drooling, ear discharge, ear pain and sore throat.   Eyes:  Negative for blurred vision.  Respiratory:  Negative for cough, shortness of breath and wheezing.   Cardiovascular:  Negative for chest pain, palpitations, orthopnea, leg swelling and PND.  Gastrointestinal:  Negative for abdominal pain, blood in stool, constipation, diarrhea and nausea.  Endocrine: Negative for polydipsia.  Genitourinary:  Negative for dysuria, frequency, hematuria, hesitancy, incomplete emptying, nocturia and urgency.  Musculoskeletal:  Negative for back pain, myalgias and neck pain.  Skin:  Negative for rash.  Allergic/Immunologic: Negative for environmental allergies.  Neurological:  Negative for dizziness and headaches.  Hematological:  Does not bruise/bleed easily.  Psychiatric/Behavioral:  Negative for suicidal ideas. The patient is not nervous/anxious.    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   Lumbar spondylosis 03/04/2019   Primary osteoarthritis of left hip 03/04/2019   Encounter for screening colonoscopy 10/28/2018   Nocturia more than twice per night 12/30/2017   Hyperlipidemia 05/15/2016   Essential hypertension 05/15/2016   Closed right hip fracture ([place]) 07/05/2015    No Known Allergies  Past Surgical History:  Procedure Laterality Date   APPENDECTOMY     COLONOSCOPY  01/2009   normal- Dr Bary Castilla   COLONOSCOPY WITH PROPOFOL N/A 11/03/2018   Procedure: COLONOSCOPY WITH PROPOFOL;  Surgeon: Robert Bellow, MD;  Location: [facility];  Service: Endoscopy;  Laterality: N/A;   EYE SURGERY Bilateral    cataracts   HERNIA REPAIR     TOTAL HIP ARTHROPLASTY Right 07/06/2015   Procedure: TOTAL HIP ARTHROPLASTY;  Surgeon: Corky Mull, MD;  Location: [facility];  Service: Orthopedics;  Laterality: Right;   TOTAL HIP ARTHROPLASTY Left 03/06/2020   Procedure: TOTAL HIP ARTHROPLASTY;  Surgeon: Corky Mull, MD;  Location: [facility] OR[facility];  Service: Orthopedics;  Laterality: Left;   TOTAL KNEE ARTHROPLASTY Left 10/20/2019   Procedure: TOTAL KNEE ARTHROPLASTY;  Surgeon: Corky Mull, MD;  Location: [facility];  Service: Orthopedics;  Laterality: Left;    Social History   Tobacco Use   Smoking status: Never   Smokeless tobacco: Never   Tobacco comments:    smoking cessation materials not required  Vaping Use   Vaping Use: Never used  Substance Use Topics   Alcohol use: Not Currently   Drug use: No     Medication list has been reviewed and updated.  Current Meds  Medication Sig   acetaminophen (TYLENOL) 500 MG tablet Take 1,000 mg by mouth every 8 (eight) hours as needed for moderate pain.    aspirin EC 81 MG tablet Take 81 mg by mouth daily. Swallow whole.   atenolol (TENORMIN) 50  MG tablet Take 1 tablet (50 mg total) by mouth 2 (two) times daily.   fenofibrate (TRICOR) 145 MG tablet Take 0.5 tablets (72.5 mg total) by mouth at bedtime.   folic acid (FOLVITE) 1 MG tablet Take 1 mg by mouth daily.    lisinopril (ZESTRIL) 10 MG tablet Take 1 tablet (10 mg total) by mouth daily.   methotrexate (RHEUMATREX) 2.5 MG tablet Take 15 mg by mouth every Saturday.    Multiple Vitamins-Minerals (CENTRUM SILVER PO) Take 1 tablet by mouth daily.   Omega-3 Fatty Acids (FISH OIL) 1000 MG CAPS Take 2,000 mg by mouth in the morning and at bedtime.    tamsulosin (FLOMAX) 0.4 MG CAPS capsule Take 1 capsule (0.4 mg total) by mouth daily.   vitamin B-12 (CYANOCOBALAMIN) 1000 MCG tablet Take 1,000 mcg by mouth daily.    PHQ 2/9 Scores 12/20/2021 06/18/2021 02/13/2021 01/01/2021  PHQ - 2 Score 0 0 0 0  PHQ- 9 Score 0 0 - 0    GAD 7 : Generalized Anxiety Score 12/20/2021 06/18/2021 06/28/2020 12/29/2019  Nervous, Anxious, on Edge 0 0 0 0  Control/stop worrying 0 0 0 0  Worry too much - different things 0 0 0 0  Trouble relaxing 0 0 0 0  Restless 0 0 0  0  Easily annoyed or irritable 0 0 0 0  Afraid - awful might happen 0 0 0 0  Total GAD 7 Score 0 0 0 0  Anxiety Difficulty Not difficult at all - - -    BP Readings from Last 3 Encounters:  12/20/21 124/80  06/18/21 120/62  01/01/21 120/70    Physical Exam Vitals and nursing note reviewed.  HENT:     Head: Normocephalic.     Right Ear: Tympanic membrane, ear canal and external ear normal.     Left Ear: Tympanic membrane, ear canal and external ear normal.     Nose: Nose normal. No congestion or rhinorrhea.     Mouth/Throat:     Mouth: Mucous membranes are moist.     Pharynx: No oropharyngeal exudate or posterior oropharyngeal erythema.  Eyes:     General: No scleral icterus.       Right eye: No discharge.        Left eye: No discharge.     Conjunctiva/sclera: Conjunctivae normal.     Pupils: Pupils are equal, round, and reactive to  light.  Neck:     Thyroid: No thyromegaly.     Vascular: No JVD.     Trachea: No tracheal deviation.  Cardiovascular:     Rate and Rhythm: Normal rate and regular rhythm.     Heart sounds: Normal heart sounds. No murmur heard.   No friction rub. No gallop.  Pulmonary:     Effort: No respiratory distress.     Breath sounds: Normal breath sounds. No wheezing, rhonchi or rales.  Abdominal:     General: Bowel sounds are normal.     Palpations: Abdomen is soft. There is no mass.     Tenderness: There is no abdominal tenderness. There is no right CVA tenderness, left CVA tenderness, guarding or rebound.  Genitourinary:    Prostate: Normal. Not enlarged, not tender and no nodules present.     Rectum: Normal. No mass.  Musculoskeletal:        General: No tenderness. Normal range of motion.     Cervical back: Normal range of motion and neck supple.  Lymphadenopathy:     Cervical: No cervical adenopathy.  Skin:    General: Skin is warm.     Coloration: Skin is not pale.     Findings: No rash.  Neurological:     Mental Status: He is alert and oriented to person, place, and time.     Cranial Nerves: No cranial nerve deficit.     Deep Tendon Reflexes: Reflexes are normal and symmetric.    Wt Readings from Last 3 Encounters:  12/20/21 186 lb (84.4 kg)  06/18/21 185 lb (83.9 kg)  01/01/21 182 lb (82.6 kg)    BP 124/80    Pulse 60    Ht 5\' 10"  (1.778 m)    Wt 186 lb (84.4 kg)    BMI 26.69 kg/m   Assessment and Plan:  1. Essential hypertension Chronic.  Controlled.  Stable.  Blood pressure is 124/80.  Patient is doing well and tolerating medication dosings at current dosings of atenolol 50 mg 1 twice a day and lisinopril 10 mg once a day.  Will check CMP for electrolytes and GFR. - atenolol (TENORMIN) 50 MG tablet; Take 1 tablet (50 mg total) by mouth 2 (two) times daily.  Dispense: 180 tablet; Refill: 1 - lisinopril (ZESTRIL) 10 MG tablet; Take 1 tablet (10 mg total) by mouth daily.   Dispense:  90 tablet; Refill: 1 - Comprehensive Metabolic Panel (CMET)  2. Mixed hyperlipidemia Chronic.  Controlled.  Stable.  Continue fenofibrate 145 mg 1/2 tablet daily.  Will check lipid panel for status of LDL. - fenofibrate (TRICOR) 145 MG tablet; Take 0.5 tablets (72.5 mg total) by mouth at bedtime.  Dispense: 45 tablet; Refill: 1 - Lipid Panel With LDL/HDL Ratio  3. Benign prostatic hyperplasia with nocturia Chronic.  Controlled.  Stable.  Pending fluid intake patient has only occasional nocturia otherwise there is no concerning symptomatology.  DRE is normal with normal examination of prostate.  We will continue tamsulosin 0.4 mg daily and check PSA for current status. - tamsulosin (FLOMAX) 0.4 MG CAPS capsule; Take 1 capsule (0.4 mg total) by mouth daily.  Dispense: 90 capsule; Refill: 1 - PSA  4. Need for immunization against influenza 6 discussed and administered. - Flu Vaccine QUAD High Dose(Fluad)

## 2021-12-21 LAB — COMPREHENSIVE METABOLIC PANEL
ALT: 11 IU/L (ref 0–44)
AST: 20 IU/L (ref 0–40)
Albumin/Globulin Ratio: 2.4 — ABNORMAL HIGH (ref 1.2–2.2)
Albumin: 4.5 g/dL (ref 3.7–4.7)
Alkaline Phosphatase: 39 IU/L — ABNORMAL LOW (ref 44–121)
BUN/Creatinine Ratio: 14 (ref 10–24)
BUN: 16 mg/dL (ref 8–27)
Bilirubin Total: 0.5 mg/dL (ref 0.0–1.2)
CO2: 22 mmol/L (ref 20–29)
Calcium: 9.4 mg/dL (ref 8.6–10.2)
Chloride: 106 mmol/L (ref 96–106)
Creatinine, Ser: 1.11 mg/dL (ref 0.76–1.27)
Globulin, Total: 1.9 g/dL (ref 1.5–4.5)
Glucose: 103 mg/dL — ABNORMAL HIGH (ref 70–99)
Potassium: 4.4 mmol/L (ref 3.5–5.2)
Sodium: 142 mmol/L (ref 134–144)
Total Protein: 6.4 g/dL (ref 6.0–8.5)
eGFR: 67 mL/min/{1.73_m2} (ref >59)

## 2021-12-21 LAB — LIPID PANEL WITH LDL/HDL RATIO
Cholesterol, Total: 118 mg/dL (ref 100–199)
HDL: 32 mg/dL — ABNORMAL LOW (ref >39)
LDL Chol Calc (NIH): 71 mg/dL (ref 0–99)
LDL/HDL Ratio: 2.2 ratio (ref 0.0–3.6)
Triglycerides: 72 mg/dL (ref 0–149)
VLDL Cholesterol Cal: 15 mg/dL (ref 5–40)

## 2021-12-21 LAB — PSA: Prostate Specific Ag, Serum: 1.7 ng/mL (ref 0.0–4.0)

## 2022-01-07 ENCOUNTER — Telehealth: Payer: Self-pay

## 2022-01-07 ENCOUNTER — Telehealth: Payer: Self-pay | Admitting: Family Medicine

## 2022-01-07 NOTE — Telephone Encounter (Signed)
I spoke to Carolyn/ spouse concerning pt's diet. When checking his cholesterol- it is good. He can eat hamburgers in moderation.

## 2022-01-07 NOTE — Telephone Encounter (Signed)
Patient daughter and Medical POA  Oree Hislop  would like PCP to know she is concerned about patient diet and eating hamburgers and unsure if PCP has communicated to patient to modify his diet. Caller states patient had a angioplasty many years ago and unsure if it has gotten worst. Caller did state she has not communicated her concerns completyly to her father but wanted PCP to be aware and unsure if patient should schedule appointment. Caller wanted to know PCP thoughts.

## 2022-02-17 ENCOUNTER — Ambulatory Visit (INDEPENDENT_AMBULATORY_CARE_PROVIDER_SITE_OTHER): Payer: Medicare PPO

## 2022-02-17 DIAGNOSIS — Z0000 Encounter for general adult medical examination without abnormal findings: Secondary | ICD-10-CM | POA: Diagnosis not present

## 2022-02-17 NOTE — Patient Instructions (Signed)
Nicholas Monroe , Thank you for taking time to come for your Medicare Wellness Visit. I appreciate your ongoing commitment to your health goals. Please review the following plan we discussed and let me know if I can assist you in the future.   Screening recommendations/referrals: Colonoscopy: no longer required Recommended yearly ophthalmology/optometry visit for glaucoma screening and checkup Recommended yearly dental visit for hygiene and checkup  Vaccinations: Influenza vaccine: done 12/20/21 Pneumococcal vaccine: done 07/06/18 Tdap vaccine: due Shingles vaccine: Shingrix discussed. Please contact your pharmacy for coverage information.  Covid-19:  done 01/20/20, 02/10/20  Advanced directives: Please bring a copy of your health care power of attorney and living will to the office at your convenience.   Conditions/risks identified: Keep up the great work!  Next appointment: Follow up in one year for your annual wellness visit.   Preventive Care 75 Years and Older, Male Preventive care refers to lifestyle choices and visits with your health care provider that can promote health and wellness. What does preventive care include? A yearly physical exam. This is also called an annual well check. Dental exams once or twice a year. Routine eye exams. Ask your health care provider how often you should have your eyes checked. Personal lifestyle choices, including: Daily care of your teeth and gums. Regular physical activity. Eating a healthy diet. Avoiding tobacco and drug use. Limiting alcohol use. Practicing safe sex. Taking low doses of aspirin every day. Taking vitamin and mineral supplements as recommended by your health care provider. What happens during an annual well check? The services and screenings done by your health care provider during your annual well check will depend on your age, overall health, lifestyle risk factors, and family history of disease. Counseling  Your health care  provider may ask you questions about your: Alcohol use. Tobacco use. Drug use. Emotional well-being. Home and relationship well-being. Sexual activity. Eating habits. History of falls. Memory and ability to understand (cognition). Work and work Statistician. Screening  You may have the following tests or measurements: Height, weight, and BMI. Blood pressure. Lipid and cholesterol levels. These may be checked every 5 years, or more frequently if you are over 43 years old. Skin check. Lung cancer screening. You may have this screening every year starting at age 68 if you have a 30-pack-year history of smoking and currently smoke or have quit within the past 15 years. Fecal occult blood test (FOBT) of the stool. You may have this test every year starting at age 12. Flexible sigmoidoscopy or colonoscopy. You may have a sigmoidoscopy every 5 years or a colonoscopy every 10 years starting at age 66. Prostate cancer screening. Recommendations will vary depending on your family history and other risks. Hepatitis C blood test. Hepatitis B blood test. Sexually transmitted disease (STD) testing. Diabetes screening. This is done by checking your blood sugar (glucose) after you have not eaten for a while (fasting). You may have this done every 1-3 years. Abdominal aortic aneurysm (AAA) screening. You may need this if you are a current or former smoker. Osteoporosis. You may be screened starting at age 24 if you are at high risk. Talk with your health care provider about your test results, treatment options, and if necessary, the need for more tests. Vaccines  Your health care provider may recommend certain vaccines, such as: Influenza vaccine. This is recommended every year. Tetanus, diphtheria, and acellular pertussis (Tdap, Td) vaccine. You may need a Td booster every 10 years. Zoster vaccine. You may need this  after age 27. Pneumococcal 13-valent conjugate (PCV13) vaccine. One dose is  recommended after age 41. Pneumococcal polysaccharide (PPSV23) vaccine. One dose is recommended after age 44. Talk to your health care provider about which screenings and vaccines you need and how often you need them. This information is not intended to replace advice given to you by your health care provider. Make sure you discuss any questions you have with your health care provider. Document Released: 12/28/2015 Document Revised: 08/20/2016 Document Reviewed: 10/02/2015 Elsevier Interactive Patient Education  2017 [place] Prevention in the Home Falls can cause injuries. They can happen to people of all ages. There are many things you can do to make your home safe and to help prevent falls. What can I do on the outside of my home? Regularly fix the edges of walkways and driveways and fix any cracks. Remove anything that might make you trip as you walk through a door, such as a raised step or threshold. Trim any bushes or trees on the path to your home. Use bright outdoor lighting. Clear any walking paths of anything that might make someone trip, such as rocks or tools. Regularly check to see if handrails are loose or broken. Make sure that both sides of any steps have handrails. Any raised decks and porches should have guardrails on the edges. Have any leaves, snow, or ice cleared regularly. Use sand or salt on walking paths during winter. Clean up any spills in your garage right away. This includes oil or grease spills. What can I do in the bathroom? Use night lights. Install grab bars by the toilet and in the tub and shower. Do not use towel bars as grab bars. Use non-skid mats or decals in the tub or shower. If you need to sit down in the shower, use a plastic, non-slip stool. Keep the floor dry. Clean up any water that spills on the floor as soon as it happens. Remove soap buildup in the tub or shower regularly. Attach bath mats securely with double-sided non-slip rug  tape. Do not have throw rugs and other things on the floor that can make you trip. What can I do in the bedroom? Use night lights. Make sure that you have a light by your bed that is easy to reach. Do not use any sheets or blankets that are too big for your bed. They should not hang down onto the floor. Have a firm chair that has side arms. You can use this for support while you get dressed. Do not have throw rugs and other things on the floor that can make you trip. What can I do in the kitchen? Clean up any spills right away. Avoid walking on wet floors. Keep items that you use a lot in easy-to-reach places. If you need to reach something above you, use a strong step stool that has a grab bar. Keep electrical cords out of the way. Do not use floor polish or wax that makes floors slippery. If you must use wax, use non-skid floor wax. Do not have throw rugs and other things on the floor that can make you trip. What can I do with my stairs? Do not leave any items on the stairs. Make sure that there are handrails on both sides of the stairs and use them. Fix handrails that are broken or loose. Make sure that handrails are as long as the stairways. Check any carpeting to make sure that it is firmly attached to the  stairs. Fix any carpet that is loose or worn. Avoid having throw rugs at the top or bottom of the stairs. If you do have throw rugs, attach them to the floor with carpet tape. Make sure that you have a light switch at the top of the stairs and the bottom of the stairs. If you do not have them, ask someone to add them for you. What else can I do to help prevent falls? Wear shoes that: Do not have high heels. Have rubber bottoms. Are comfortable and fit you well. Are closed at the toe. Do not wear sandals. If you use a stepladder: Make sure that it is fully opened. Do not climb a closed stepladder. Make sure that both sides of the stepladder are locked into place. Ask someone to  hold it for you, if possible. Clearly mark and make sure that you can see: Any grab bars or handrails. First and last steps. Where the edge of each step is. Use tools that help you move around (mobility aids) if they are needed. These include: Canes. Walkers. Scooters. Crutches. Turn on the lights when you go into a dark area. Replace any light bulbs as soon as they burn out. Set up your furniture so you have a clear path. Avoid moving your furniture around. If any of your floors are uneven, fix them. If there are any pets around you, be aware of where they are. Review your medicines with your doctor. Some medicines can make you feel dizzy. This can increase your chance of falling. Ask your doctor what other things that you can do to help prevent falls. This information is not intended to replace advice given to you by your health care provider. Make sure you discuss any questions you have with your health care provider. Document Released: 09/27/2009 Document Revised: 05/08/2016 Document Reviewed: 01/05/2015 Elsevier Interactive Patient Education  2017 [employer].

## 2022-02-17 NOTE — Progress Notes (Signed)
Subjective:   Nicholas Monroe is a 81 y.o. male who presents for Medicare Annual/Subsequent preventive examination.  Virtual Visit via Telephone Note  I connected with  [name] on 02/17/22 at 11:20 AM EST by telephone and verified that I am speaking with the correct person using two identifiers.  Location: Patient: home Provider: [facility] Persons participating in the virtual visit: [place]   I discussed the limitations, risks, security and privacy concerns of performing an evaluation and management service by telephone and the availability of in person appointments. The patient expressed understanding and agreed to proceed.  Interactive audio and video telecommunications were attempted between this nurse and patient, however failed, due to patient having technical difficulties OR patient did not have access to video capability.  We continued and completed visit with audio only.  Some vital signs may be absent or patient reported.   Clemetine Marker, LPN   Review of Systems     Cardiac Risk Factors include: advanced age (>63mn, >>62women);male gender;dyslipidemia;hypertension     Objective:    There were no vitals filed for this visit. There is no height or weight on file to calculate BMI.  Advanced Directives 02/17/2022 02/13/2021 03/06/2020 03/06/2020 02/23/2020 02/08/2020 10/20/2019  Does Patient Have a Medical Advance Directive? Yes Yes Yes Yes Yes Yes Yes  Type of AParamedicof ADowningLiving will HEle[place]Living will H[place]Living will Healthcare Power of [place]Living will [place]Living will [place] Does patient want to make changes to medical advance directive? - - No - Patient declined No - Patient declined No - Patient declined - No - Patient declined  Copy of HWadsworthin Chart? No - copy  requested No - copy requested No - copy requested No - copy requested No - copy requested No - copy requested No - copy requested  Would patient like information on creating a medical advance directive? - - No - Patient declined No - Patient declined No - Patient declined - No - Patient declined    Current Medications (verified) Outpatient Encounter Medications as of 02/17/2022  Medication Sig   acetaminophen (TYLENOL) 500 MG tablet Take 1,000 mg by mouth every 8 (eight) hours as needed for moderate pain.    aspirin EC 81 MG tablet Take 81 mg by mouth daily. Swallow whole.   atenolol (TENORMIN) 50 MG tablet Take 1 tablet (50 mg total) by mouth 2 (two) times daily.   fenofibrate (TRICOR) 145 MG tablet Take 0.5 tablets (72.5 mg total) by mouth at bedtime.   folic acid (FOLVITE) 1 MG tablet Take 1 mg by mouth daily.    lisinopril (ZESTRIL) 10 MG tablet Take 1 tablet (10 mg total) by mouth daily.   methotrexate (RHEUMATREX) 2.5 MG tablet Take 15 mg by mouth every Saturday.    Multiple Vitamins-Minerals (CENTRUM SILVER PO) Take 1 tablet by mouth daily.   Omega-3 Fatty Acids (FISH OIL) 1000 MG CAPS Take 2,000 mg by mouth in the morning and at bedtime.    tamsulosin (FLOMAX) 0.4 MG CAPS capsule Take 1 capsule (0.4 mg total) by mouth daily.   vitamin B-12 (CYANOCOBALAMIN) 1000 MCG tablet Take 1,000 mcg by mouth daily.   No facility-administered encounter medications on file as of 02/17/2022.    Allergies (verified) Patient has no known allergies.   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   Procedure: TOTAL HIP ARTHROPLASTY;  Surgeon: Corky Mull, MD;  Location: [facility] ORS;  Service: Orthopedics;  Laterality: Right;   TOTAL HIP ARTHROPLASTY Left 03/06/2020   Procedure: TOTAL HIP ARTHROPLASTY;  Surgeon: Corky Mull, MD;  Location: [facility] ORS;  Service: Orthopedics;  Laterality: Left;   TOTAL KNEE ARTHROPLASTY Left 10/20/2019   Procedure: TOTAL KNEE ARTHROPLASTY;  Surgeon: Corky Mull, MD;  Location: [facility] ORS;  Service: Orthopedics;  Laterality: Left;   Family History  Problem Relation Age of Onset   Healthy Father    Diabetes Brother    Hypertension Mother    Colon cancer Mother 57   Diabetes Daughter        type 1   Diabetes Brother    Stroke Brother    Social History   Socioeconomic History   Marital status: Married    Spouse name: Not on file   Number of children: 2   Years of education: Not on file   Highest education level: Master's degree (e.g., MA, MS, MEng, MEd, MSW, MBA)  Occupational History   Occupation: Retired  Tobacco Use   Smoking status: Never   Smokeless tobacco: Never   Tobacco comments:    smoking cessation materials not required  Vaping Use   Vaping Use: Never used  Substance and Sexual Activity   Alcohol use: Not Currently   Drug use: No   Sexual activity: Not Currently  Other Topics Concern   Not on file  Social History Narrative   Not on file   Social Determinants of Health   Financial Resource Strain: Low Risk    Difficulty of Paying Living Expenses: Not hard at all  Food Insecurity: No Food Insecurity   Worried About Charity fundraiser in the Last Year: Never true   [place] in the Last Year: Never true  Transportation Needs: No Transportation Needs   Lack of Transportation (Medical): No   Lack of Transportation (Non-Medical): No  Physical Activity: Inactive   Days of Exercise per Week: 0 days   Minutes of Exercise per  Session: 0 min  Stress: No Stress Concern Present   Feeling of Stress : Not at all  Social Connections: Moderately Integrated   Frequency of Communication with Friends and Family: More than three times a week   Frequency of Social Gatherings with Friends and Family: More than three times a week   Attends Religious Services: More than 4 times per year   Active Member of [employer] or Organizations: No   Attends Music therapist: Never   Marital Status: Married    Tobacco Counseling Counseling given: Not Answered Tobacco comments: smoking cessation materials not required   Clinical Intake:  Pre-visit preparation completed: Yes  Pain : No/denies pain     Nutritional Risks: None Diabetes: No  How often do you need to have someone help you when you read instructions, pamphlets, or other written materials  from your doctor or pharmacy?: 1 - Never    Interpreter Needed?: No  Information entered by :: Clemetine Marker LPN   Activities of Daily Living In your present state of health, do you have any difficulty performing the following activities: 02/17/2022  Hearing? Y  Vision? N  Difficulty concentrating or making decisions? N  Walking or climbing stairs? N  Dressing or bathing? N  Doing errands, shopping? N  Preparing Food and eating ? N  Using the Toilet? N  In the past six months, have you accidently leaked urine? N  Do you have problems with loss of bowel control? N  Managing your Medications? N  Managing your Finances? N  Housekeeping or managing your Housekeeping? N  Some recent data might be hidden    Patient Care Team: Juline Patch, MD as PCP - General (Family Medicine) Vladimir Crofts, MD as Consulting Physician (Neurology) Poggi, Marshall Cork, MD as Consulting Physician (Orthopedic Surgery)  Indicate any recent Medical Services you may have received from other than [facility] providers in the past year (date may be approximate).     Assessment:   This is a  routine wellness examination for Nicholas Monroe.  Hearing/Vision screen Hearing Screening - Comments:: Pt c/o mild hearing difficulty; occasionally wears left hearing aid he was given from his daughter in law Vision Screening - Comments:: Annual vision screenings done by [facility]; due for exam  Dietary issues and exercise activities discussed: Current Exercise Habits: Home exercise routine, Type of exercise: Other - see comments (yard and home maintenance), Intensity: Mild, Exercise limited by: None identified   Goals Addressed             This Visit's Progress    Exercise 150 min/wk Moderate Activity   On track    Recommend to exercise for at least 150 minutes per week.        Depression Screen PHQ 2/9 Scores 02/17/2022 12/20/2021 06/18/2021 02/13/2021 01/01/2021 06/28/2020 02/08/2020  PHQ - 2 Score 0 0 0 0 0 0 0  PHQ- 9 Score 0 0 0 - 0 0 -    Fall Risk Fall Risk  02/17/2022 02/13/2021 06/28/2020 02/08/2020 12/29/2019  Falls in the past year? 0 0 0 0 0  Number falls in past yr: 0 0 - 0 -  Injury with Fall? 0 0 - 0 -  Risk for fall due to : No Fall Risks No Fall Risks - Impaired balance/gait Impaired balance/gait  Risk for fall due to: Comment - - - - Had knee replacement  Follow up Falls prevention discussed Falls prevention discussed Falls evaluation completed Falls prevention discussed Falls evaluation completed    FALL RISK PREVENTION PERTAINING TO THE HOME:  Any stairs in or around the home? Yes  If so, are there any without handrails? No  Home free of loose throw rugs in walkways, pet beds, electrical cords, etc? Yes  Adequate lighting in your home to reduce risk of falls? Yes   ASSISTIVE DEVICES UTILIZED TO PREVENT FALLS:  Life alert? No  Use of a cane, walker or w/c? No  Grab bars in the bathroom? Yes  Shower chair or bench in shower? No  Elevated toilet seat or a handicapped toilet? Yes   TIMED UP AND GO:  Was the test performed? No . Telephonic visit.   Cognitive  Function: Normal cognitive status assessed by direct observation by this Nurse Health Advisor. No abnormalities found.       6CIT Screen 01/03/2019 12/30/2017  What Year? 0 points 0 points  What month? 0 points 0 points  What time? 0 points 0 points  Count back from 20 0 points 0 points  Months in reverse 0 points 0 points  Repeat phrase 0 points 0 points  Total Score 0 0    Immunizations Immunization History  Administered Date(s) Administered   Fluad Quad(high Dose 65+) 12/29/2019, 01/01/2021, 12/20/2021   Influenza, High Dose Seasonal PF 10/23/2017, 10/05/2018   Influenza,inj,Quad PF,6+ Mos 09/27/2015   PFIZER(Purple Top)SARS-COV-2 Vaccination 01/20/2020, 02/10/2020   Pneumococcal Conjugate-13 01/14/2017   Pneumococcal Polysaccharide-23 07/06/2018   Tdap 01/14/2017    TDAP status: Due, Education has been provided regarding the importance of this vaccine. Advised may receive this vaccine at local pharmacy or Health Dept. Aware to provide a copy of the vaccination record if obtained from local pharmacy or Health Dept. Verbalized acceptance and understanding.  Flu Vaccine status: Up to date  Pneumococcal vaccine status: Completed during today's visit.  Covid-19 vaccine status: Completed vaccines  Qualifies for Shingles Vaccine? Yes   Zostavax completed No   Shingrix Completed?: No.    Education has been provided regarding the importance of this vaccine. Patient has been advised to call insurance company to determine out of pocket expense if they have not yet received this vaccine. Advised may also receive vaccine at local pharmacy or Health Dept. Verbalized acceptance and understanding.  Screening Tests Health Maintenance  Topic Date Due   Zoster Vaccines- Shingrix (1 of 2) 03/20/2022 (Originally [birth year]1991)   TETANUS/TDAP  01/14/2027   Pneumonia Vaccine 61+ Years old  Completed   INFLUENZA VACCINE  Completed   HPV VACCINES  Aged Out   COVID-19 Vaccine  Discontinued     Health Maintenance  There are no preventive care reminders to display for this patient.  Colorectal cancer screening: No longer required.   Lung Cancer Screening: (Low Dose CT Chest recommended if Age 54-80 years, 30 pack-year currently smoking OR have quit w/in 15years.) does not qualify.   Additional Screening:  Hepatitis C Screening: does not qualify;  Vision Screening: Recommended annual ophthalmology exams for early detection of glaucoma and other disorders of the eye. Is the patient up to date with their annual eye exam?  No  Who is the provider or what is the name of the office in which the patient attends annual eye exams? [facility].   Dental Screening: Recommended annual dental exams for proper oral hygiene  Community Resource Referral / Chronic Care Management: CRR required this visit?  No   CCM required this visit?  No      Plan:     I have personally reviewed and noted the following in the patients chart:   Medical and social history Use of alcohol, tobacco or illicit drugs  Current medications and supplements including opioid prescriptions. Patient is not currently taking opioid prescriptions. Functional ability and status Nutritional status Physical activity Advanced directives List of other physicians Hospitalizations, surgeries, and ER visits in previous 12 months Vitals Screenings to include cognitive, depression, and falls Referrals and appointments  In addition, I have reviewed and discussed with patient certain preventive protocols, quality metrics, and best practice recommendations. A written personalized care plan for preventive services as well as general preventive health recommendations were provided to patient.     Clemetine Marker, LPN   03/21/8294   Nurse Notes: none

## 2022-06-02 DIAGNOSIS — H9193 Unspecified hearing loss, bilateral: Secondary | ICD-10-CM | POA: Diagnosis not present

## 2022-06-02 DIAGNOSIS — R4189 Other symptoms and signs involving cognitive functions and awareness: Secondary | ICD-10-CM | POA: Diagnosis not present

## 2022-06-02 DIAGNOSIS — G7000 Myasthenia gravis without (acute) exacerbation: Secondary | ICD-10-CM | POA: Diagnosis not present

## 2022-06-09 ENCOUNTER — Other Ambulatory Visit: Payer: Self-pay | Admitting: Family Medicine

## 2022-06-09 DIAGNOSIS — N401 Enlarged prostate with lower urinary tract symptoms: Secondary | ICD-10-CM

## 2022-06-11 DIAGNOSIS — D1801 Hemangioma of skin and subcutaneous tissue: Secondary | ICD-10-CM | POA: Diagnosis not present

## 2022-06-19 ENCOUNTER — Ambulatory Visit: Payer: Medicare PPO | Admitting: Family Medicine

## 2022-06-20 ENCOUNTER — Ambulatory Visit: Payer: Medicare PPO | Admitting: Family Medicine

## 2022-07-02 ENCOUNTER — Ambulatory Visit: Payer: Medicare PPO | Admitting: Family Medicine

## 2022-07-02 ENCOUNTER — Encounter: Payer: Self-pay | Admitting: Family Medicine

## 2022-07-02 VITALS — BP 120/64 | HR 64 | Ht 70.0 in | Wt 183.0 lb

## 2022-07-02 DIAGNOSIS — I10 Essential (primary) hypertension: Secondary | ICD-10-CM | POA: Diagnosis not present

## 2022-07-02 DIAGNOSIS — R351 Nocturia: Secondary | ICD-10-CM | POA: Diagnosis not present

## 2022-07-02 DIAGNOSIS — N401 Enlarged prostate with lower urinary tract symptoms: Secondary | ICD-10-CM

## 2022-07-02 DIAGNOSIS — E782 Mixed hyperlipidemia: Secondary | ICD-10-CM

## 2022-07-02 MED ORDER — ATENOLOL 50 MG PO TABS: 50.0000 mg | ORAL_TABLET | Freq: Every day | ORAL | 1 refills | Status: DC

## 2022-07-02 MED ORDER — TAMSULOSIN HCL 0.4 MG PO CAPS: 0.4000 mg | ORAL_CAPSULE | Freq: Every day | ORAL | 1 refills | Status: DC

## 2022-07-02 MED ORDER — LISINOPRIL 10 MG PO TABS: 10.0000 mg | ORAL_TABLET | Freq: Every day | ORAL | 1 refills | Status: DC

## 2022-07-02 MED ORDER — FENOFIBRATE 145 MG PO TABS: 72.5000 mg | ORAL_TABLET | Freq: Every day | ORAL | 1 refills | Status: DC

## 2022-07-02 NOTE — Progress Notes (Signed)
Date:  07/02/2022   Name:  Nicholas Monroe   DOB:  05-08-1941   MRN:  [id number]   Chief Complaint: Hypertension (Stopped taking the evening dose of Atenolol because bp was getting too low), Hyperlipidemia, and Benign Prostatic Hypertrophy  Hypertension This is a chronic problem. The current episode started more than 1 year ago. The problem has been gradually improving since onset. The problem is controlled. Pertinent negatives include no anxiety, blurred vision, chest pain, headaches, neck pain, orthopnea, palpitations, peripheral edema, PND, shortness of breath or sweats. There are no known risk factors for coronary artery disease. Past treatments include ACE inhibitors and beta blockers. The current treatment provides moderate improvement. There are no compliance problems.  There is no history of angina, kidney disease, CAD/MI, CVA, heart failure, left ventricular hypertrophy, PVD or retinopathy. There is no history of chronic renal disease, a hypertension causing med or renovascular disease.  Hyperlipidemia This is a chronic problem. The current episode started more than 1 year ago. The problem is controlled. Recent lipid tests were reviewed and are normal. He has no history of chronic renal disease, diabetes, hypothyroidism, liver disease, obesity or nephrotic syndrome. There are no known factors aggravating his hyperlipidemia. Pertinent negatives include no chest pain, focal sensory loss, focal weakness, leg pain, myalgias or shortness of breath. Current antihyperlipidemic treatment includes statins. The current treatment provides moderate improvement of lipids. There are no compliance problems.   Benign Prostatic Hypertrophy The current episode started more than 1 year ago. Irritative symptoms include nocturia. Irritative symptoms do not include frequency or urgency. Obstructive symptoms do not include incomplete emptying or a weak stream. Pertinent negatives include no chills, dysuria,  hematuria or nausea.    Lab Results  Component Value Date   NA 142 12/20/2021   K 4.4 12/20/2021   CO2 22 12/20/2021   GLUCOSE 103 (H) 12/20/2021   BUN 16 12/20/2021   CREATININE 1.11 12/20/2021   CALCIUM 9.4 12/20/2021   EGFR 67 12/20/2021   GFRNONAA 61 01/01/2021   Lab Results  Component Value Date   CHOL 118 12/20/2021   HDL 32 (L) 12/20/2021   LDLCALC 71 12/20/2021   TRIG 72 12/20/2021   CHOLHDL 4.3 01/06/2019   No results found for: "TSH" No results found for: "HGBA1C" Lab Results  Component Value Date   WBC 4.5 06/28/2020   HGB 12.7 (L) 06/28/2020   HCT 36.6 (L) 06/28/2020   MCV 92 06/28/2020   PLT 163 06/28/2020   Lab Results  Component Value Date   ALT 11 12/20/2021   AST 20 12/20/2021   ALKPHOS 39 (L) 12/20/2021   BILITOT 0.5 12/20/2021   No results found for: "25OHVITD2", "25OHVITD3", "VD25OH"   Review of Systems  Constitutional:  Negative for chills and fever.  HENT:  Negative for drooling, ear discharge, ear pain and sore throat.   Eyes:  Negative for blurred vision.  Respiratory:  Negative for cough, shortness of breath and wheezing.   Cardiovascular:  Negative for chest pain, palpitations, orthopnea, leg swelling and PND.  Gastrointestinal:  Negative for abdominal pain, blood in stool, constipation, diarrhea and nausea.  Endocrine: Negative for polydipsia.  Genitourinary:  Positive for nocturia. Negative for dysuria, frequency, hematuria, incomplete emptying and urgency.  Musculoskeletal:  Negative for back pain, myalgias and neck pain.  Skin:  Negative for rash.  Allergic/Immunologic: Negative for environmental allergies.  Neurological:  Negative for dizziness, focal weakness and headaches.  Hematological:  Does not bruise/bleed easily.  Psychiatric/Behavioral:  Negative for suicidal ideas. The patient is not nervous/anxious.     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   Lumbar spondylosis 03/04/2019   Primary osteoarthritis of left hip 03/04/2019   Encounter for screening colonoscopy 10/28/2018   Nocturia more than twice per night 12/30/2017   Hyperlipidemia 05/15/2016   Essential hypertension 05/15/2016   Closed right hip fracture ([place]) 07/05/2015    No Known Allergies  Past Surgical History:  Procedure Laterality Date   APPENDECTOMY     COLONOSCOPY  01/2009   normal- Dr Bary Castilla   COLONOSCOPY WITH PROPOFOL N/A 11/03/2018   Procedure: COLONOSCOPY WITH PROPOFOL;  Surgeon: Robert Bellow, MD;  Location: [facility];  Service: Endoscopy;  Laterality: N/A;   EYE SURGERY Bilateral    cataracts   HERNIA REPAIR     TOTAL HIP ARTHROPLASTY Right 07/06/2015   Procedure: TOTAL HIP ARTHROPLASTY;  Surgeon: Corky Mull, MD;  Location: [facility];  Service: Orthopedics;  Laterality: Right;   TOTAL HIP ARTHROPLASTY Left 03/06/2020   Procedure: TOTAL HIP ARTHROPLASTY;  Surgeon: Corky Mull, MD;  Location: [facility];  Service: Orthopedics;  Laterality: Left;   TOTAL KNEE ARTHROPLASTY Left 10/20/2019   Procedure: TOTAL KNEE ARTHROPLASTY;  Surgeon: Corky Mull, MD;  Location: [facility];  Service: Orthopedics;  Laterality: Left;    Social History   Tobacco Use   Smoking status: Never   Smokeless tobacco: Never   Tobacco comments:    smoking cessation materials not required  Vaping Use   Vaping Use: Never used  Substance Use Topics   Alcohol use: Not Currently   Drug use: No     Medication list has been reviewed and updated.  Current Meds  Medication Sig   acetaminophen (TYLENOL) 500 MG tablet Take 1,000 mg by mouth every 8 (eight) hours as needed for moderate pain.    aspirin EC 81 MG tablet Take 81 mg by mouth daily. Swallow whole.   atenolol (TENORMIN) 50 MG tablet Take 1 tablet (50 mg total) by mouth 2 (two) times daily. (Patient  taking differently: Take 50 mg by mouth daily. Take 1 tablet in the morning)   fenofibrate (TRICOR) 145 MG tablet Take 0.5 tablets (72.5 mg total) by mouth at bedtime.   folic acid (FOLVITE) 1 MG tablet Take 1 mg by mouth daily.    lisinopril (ZESTRIL) 10 MG tablet Take 1 tablet (10 mg total) by mouth daily.   methotrexate (RHEUMATREX) 2.5 MG tablet Take 15 mg by mouth every Saturday.    Multiple Vitamins-Minerals (CENTRUM SILVER PO) Take 1 tablet by mouth daily.   Omega-3 Fatty Acids (FISH OIL) 1000 MG CAPS Take 2,000 mg by mouth in the morning and at bedtime.    tamsulosin (FLOMAX) 0.4 MG CAPS capsule TAKE (1) CAPSULE BY MOUTH EVERY DAY   vitamin B-12 (CYANOCOBALAMIN) 1000 MCG tablet Take 1,000 mcg by mouth daily.       07/02/2022    8:30 AM 12/20/2021    8:29 AM 06/18/2021    8:22 AM 06/28/2020    8:36 AM  GAD 7 : Generalized Anxiety Score  Nervous, Anxious, on Edge 0 0 0 0  Control/stop worrying 0 0 0 0  Worry too much - different things 0 0 0 0  Trouble relaxing 0 0 0 0  Restless 0 0 0 0  Easily annoyed or irritable 0 0 0 0  Afraid - awful might happen 0 0 0 0  Total GAD 7 Score 0 0 0 0  Anxiety Difficulty Not difficult at all Not difficult at all         07/02/2022    8:30 AM 02/17/2022   11:39 AM 12/20/2021    8:29 AM  Depression screen PHQ 2/9  Decreased Interest 0 0 0  Down, Depressed, Hopeless 0 0 0  PHQ - 2 Score 0 0 0  Altered sleeping 0 0 0  Tired, decreased energy 0 0 0  Change in appetite 0 0 0  Feeling bad or failure about yourself  0 0 0  Trouble concentrating 0 0 0  Moving slowly or fidgety/restless 0 0 0  Suicidal thoughts 0 0 0  PHQ-9 Score 0 0 0  Difficult doing work/chores Not difficult at all Not difficult at all Not difficult at all    BP Readings from Last 3 Encounters:  07/02/22 120/64  12/20/21 124/80  06/18/21 120/62    Physical Exam Vitals and nursing note reviewed.  HENT:     Head: Normocephalic.     Right Ear: Tympanic membrane and  external ear normal.     Left Ear: Tympanic membrane and external ear normal.     Nose: Nose normal.     Mouth/Throat:     Mouth: Mucous membranes are moist.  Eyes:     General: No scleral icterus.       Right eye: No discharge.        Left eye: No discharge.     Conjunctiva/sclera: Conjunctivae normal.     Pupils: Pupils are equal, round, and reactive to light.  Neck:     Thyroid: No thyromegaly.     Vascular: No JVD.     Trachea: No tracheal deviation.  Cardiovascular:     Rate and Rhythm: Normal rate and regular rhythm.     Heart sounds: Normal heart sounds. No murmur heard.    No friction rub. No gallop.  Pulmonary:     Effort: No respiratory distress.     Breath sounds: Normal breath sounds. No wheezing, rhonchi or rales.  Abdominal:     General: Bowel sounds are normal.     Palpations: Abdomen is soft. There is no mass.     Tenderness: There is no abdominal tenderness. There is no guarding or rebound.  Musculoskeletal:        General: No tenderness. Normal range of motion.     Cervical back: Normal range of motion and neck supple.  Lymphadenopathy:     Cervical: No cervical adenopathy.  Skin:    General: Skin is warm.     Findings: No rash.  Neurological:     Mental Status: He is alert and oriented to person, place, and time.     Cranial Nerves: No cranial nerve deficit.     Deep Tendon Reflexes: Reflexes are normal and symmetric.     Wt Readings from Last 3 Encounters:  07/02/22 183 lb (83 kg)  12/20/21 186 lb (84.4 kg)  06/18/21 185 lb (83.9 kg)    BP 120/64   Pulse 64   Ht 5' 10"  (1.778 m)   Wt 183 lb (83 kg)   BMI 26.26 kg/m   Assessment and Plan:

## 2022-07-03 LAB — RENAL FUNCTION PANEL
Albumin: 4.2 g/dL (ref 3.8–4.8)
BUN/Creatinine Ratio: 22 (ref 10–24)
BUN: 25 mg/dL (ref 8–27)
CO2: 22 mmol/L (ref 20–29)
Calcium: 9.2 mg/dL (ref 8.6–10.2)
Chloride: 106 mmol/L (ref 96–106)
Creatinine, Ser: 1.14 mg/dL (ref 0.76–1.27)
Glucose: 108 mg/dL — ABNORMAL HIGH (ref 70–99)
Phosphorus: 2.9 mg/dL (ref 2.8–4.1)
Potassium: 4.1 mmol/L (ref 3.5–5.2)
Sodium: 139 mmol/L (ref 134–144)
eGFR: 65 mL/min/{1.73_m2} (ref >59)

## 2022-07-03 LAB — LIPID PANEL WITH LDL/HDL RATIO
Cholesterol, Total: 117 mg/dL (ref 100–199)
HDL: 34 mg/dL — ABNORMAL LOW (ref >39)
LDL Chol Calc (NIH): 69 mg/dL (ref 0–99)
LDL/HDL Ratio: 2 ratio (ref 0.0–3.6)
Triglycerides: 69 mg/dL (ref 0–149)
VLDL Cholesterol Cal: 14 mg/dL (ref 5–40)

## 2022-10-06 ENCOUNTER — Other Ambulatory Visit: Payer: Self-pay | Admitting: Family Medicine

## 2022-10-06 DIAGNOSIS — I10 Essential (primary) hypertension: Secondary | ICD-10-CM

## 2022-10-07 ENCOUNTER — Other Ambulatory Visit: Payer: Self-pay | Admitting: Family Medicine

## 2022-10-07 DIAGNOSIS — I10 Essential (primary) hypertension: Secondary | ICD-10-CM

## 2022-10-07 NOTE — Telephone Encounter (Signed)
Requested Prescriptions  Pending Prescriptions Disp Refills  . lisinopril (ZESTRIL) 10 MG tablet [Pharmacy Med Name: LISINOPRIL 10 MG TAB] 90 tablet 1    Sig: TAKE (1) TABLET BY MOUTH EVERY DAY     Cardiovascular:  ACE Inhibitors Passed - 10/06/2022 11:39 AM      Passed - Cr in normal range and within 180 days    Creatinine  Date Value Ref Range Status  05/13/2013 1.03 0.60 - 1.30 mg/dL Final   Creatinine, Ser  Date Value Ref Range Status  07/02/2022 1.14 0.76 - 1.27 mg/dL Final         Passed - K in normal range and within 180 days    Potassium  Date Value Ref Range Status  07/02/2022 4.1 3.5 - 5.2 mmol/L Final         Passed - Patient is not pregnant      Passed - Last BP in normal range    BP Readings from Last 1 Encounters:  07/02/22 120/64         Passed - Valid encounter within last 6 months    Recent Outpatient Visits          3 months ago Essential hypertension   [facility] [place], Deanna C, MD   9 months ago Essential hypertension   [facility] [place], Deanna C, MD   1 year ago Benign prostatic hyperplasia with nocturia   [facility] [place], Deanna C, MD   1 year ago Essential hypertension   [facility] [place], Deanna C, MD   2 years ago Essential hypertension   [facility] [place], [place], MD      Future Appointments            In 2 months Juline Patch, MD [facility], [facility]

## 2023-01-02 ENCOUNTER — Ambulatory Visit
Admission: RE | Admit: 2023-01-02 | Discharge: 2023-01-02 | Disposition: A | Discharge status: Home / Self Care | Payer: Medicare PPO | Source: Ambulatory Visit | Attending: Family Medicine | Admitting: Family Medicine

## 2023-01-02 ENCOUNTER — Ambulatory Visit
Admission: RE | Admit: 2023-01-02 | Discharge: 2023-01-02 | Disposition: A | Discharge status: Home / Self Care | Payer: Medicare PPO | Attending: Family Medicine | Admitting: Family Medicine

## 2023-01-02 ENCOUNTER — Encounter: Payer: Self-pay | Admitting: Family Medicine

## 2023-01-02 ENCOUNTER — Ambulatory Visit: Payer: Medicare PPO | Admitting: Family Medicine

## 2023-01-02 VITALS — BP 120/78 | HR 60 | Ht 70.0 in | Wt 181.0 lb

## 2023-01-02 DIAGNOSIS — G8929 Other chronic pain: Secondary | ICD-10-CM | POA: Insufficient documentation

## 2023-01-02 DIAGNOSIS — I10 Essential (primary) hypertension: Secondary | ICD-10-CM | POA: Diagnosis not present

## 2023-01-02 DIAGNOSIS — M1711 Unilateral primary osteoarthritis, right knee: Secondary | ICD-10-CM | POA: Diagnosis not present

## 2023-01-02 DIAGNOSIS — Z23 Encounter for immunization: Secondary | ICD-10-CM

## 2023-01-02 DIAGNOSIS — E782 Mixed hyperlipidemia: Secondary | ICD-10-CM | POA: Diagnosis not present

## 2023-01-02 DIAGNOSIS — M25561 Pain in right knee: Secondary | ICD-10-CM

## 2023-01-02 DIAGNOSIS — N401 Enlarged prostate with lower urinary tract symptoms: Secondary | ICD-10-CM

## 2023-01-02 DIAGNOSIS — R351 Nocturia: Secondary | ICD-10-CM | POA: Diagnosis not present

## 2023-01-02 MED ORDER — FENOFIBRATE 145 MG PO TABS: 72.5000 mg | ORAL_TABLET | Freq: Every day | ORAL | 1 refills | Status: DC

## 2023-01-02 MED ORDER — LISINOPRIL 10 MG PO TABS: ORAL_TABLET | ORAL | 1 refills | Status: DC

## 2023-01-02 MED ORDER — TAMSULOSIN HCL 0.4 MG PO CAPS: 0.4000 mg | ORAL_CAPSULE | Freq: Every day | ORAL | 1 refills | Status: DC

## 2023-01-02 MED ORDER — ATENOLOL 50 MG PO TABS: 50.0000 mg | ORAL_TABLET | Freq: Every day | ORAL | 1 refills | Status: DC

## 2023-01-02 NOTE — Progress Notes (Signed)
Date:  01/02/2023   Name:  Nicholas Monroe   DOB:  12/04/1941   MRN:  [id number]   Chief Complaint: Flu Vaccine, Hypertension, Hyperlipidemia, and Benign Prostatic Hypertrophy  Hypertension This is a chronic problem. The current episode started more than 1 year ago. The problem is controlled. Pertinent negatives include no anxiety, blurred vision, chest pain, headaches, neck pain, orthopnea, palpitations, peripheral edema, PND or shortness of breath. There are no associated agents to hypertension. Risk factors for coronary artery disease include dyslipidemia. Past treatments include beta blockers and ACE inhibitors. The current treatment provides moderate improvement. There are no compliance problems.  There is no history of angina, kidney disease, CAD/MI, CVA, heart failure, left ventricular hypertrophy, PVD or retinopathy. There is no history of chronic renal disease, a hypertension causing med or renovascular disease.  Hyperlipidemia This is a chronic problem. The current episode started more than 1 year ago. The problem is controlled. Recent lipid tests were reviewed and are normal. He has no history of chronic renal disease. Pertinent negatives include no chest pain, focal sensory loss, focal weakness, leg pain, myalgias or shortness of breath. Current antihyperlipidemic treatment includes statins. The current treatment provides moderate improvement of lipids. There are no compliance problems.  Risk factors for coronary artery disease include dyslipidemia and hypertension.  Benign Prostatic Hypertrophy This is a chronic problem. The current episode started more than 1 year ago. The problem has been gradually improving since onset. Irritative symptoms do not include frequency or urgency. Pertinent negatives include no chills, dysuria, hematuria or nausea. Past treatments include tamsulosin. The treatment provided moderate relief.    Lab Results  Component Value Date   NA 139 07/02/2022   K  4.1 07/02/2022   CO2 22 07/02/2022   GLUCOSE 108 (H) 07/02/2022   BUN 25 07/02/2022   CREATININE 1.14 07/02/2022   CALCIUM 9.2 07/02/2022   EGFR 65 07/02/2022   GFRNONAA 61 01/01/2021   Lab Results  Component Value Date   CHOL 117 07/02/2022   HDL 34 (L) 07/02/2022   LDLCALC 69 07/02/2022   TRIG 69 07/02/2022   CHOLHDL 4.3 01/06/2019   No results found for: "TSH" No results found for: "HGBA1C" Lab Results  Component Value Date   WBC 4.5 06/28/2020   HGB 12.7 (L) 06/28/2020   HCT 36.6 (L) 06/28/2020   MCV 92 06/28/2020   PLT 163 06/28/2020   Lab Results  Component Value Date   ALT 11 12/20/2021   AST 20 12/20/2021   ALKPHOS 39 (L) 12/20/2021   BILITOT 0.5 12/20/2021   No results found for: "25OHVITD2", "25OHVITD3", "VD25OH"   Review of Systems  Constitutional:  Negative for chills and fever.  HENT:  Negative for drooling, ear discharge, ear pain and sore throat.   Eyes:  Negative for blurred vision.  Respiratory:  Negative for cough, shortness of breath and wheezing.   Cardiovascular:  Negative for chest pain, palpitations, orthopnea, leg swelling and PND.  Gastrointestinal:  Negative for abdominal pain, blood in stool, constipation, diarrhea and nausea.  Endocrine: Negative for polydipsia.  Genitourinary:  Negative for dysuria, frequency, hematuria and urgency.  Musculoskeletal:  Negative for back pain, myalgias and neck pain.  Skin:  Negative for rash.  Allergic/Immunologic: Negative for environmental allergies.  Neurological:  Negative for dizziness, focal weakness and headaches.  Hematological:  Does not bruise/bleed easily.  Psychiatric/Behavioral:  Negative for suicidal ideas. The patient is not nervous/anxious.     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/2020   Benign prostatic hyperplasia with nocturia 12/29/2019   Status post total knee replacement using cement, left 10/20/2019   Lumbar  spondylosis 03/04/2019   Primary osteoarthritis of left hip 03/04/2019   Encounter for screening colonoscopy 10/28/2018   Nocturia more than twice per night 12/30/2017   Hyperlipidemia 05/15/2016   Essential hypertension 05/15/2016   Closed right hip fracture ([place]) 07/05/2015    No Known Allergies  Past Surgical History:  Procedure Laterality Date   APPENDECTOMY     COLONOSCOPY  01/2009   normal- Dr Bary Castilla   COLONOSCOPY WITH PROPOFOL N/A 11/03/2018   Procedure: COLONOSCOPY WITH PROPOFOL;  Surgeon: Robert Bellow, MD;  Location: [facility];  Service: Endoscopy;  Laterality: N/A;   EYE SURGERY Bilateral    cataracts   HERNIA REPAIR     TOTAL HIP ARTHROPLASTY Right 07/06/2015   Procedure: TOTAL HIP ARTHROPLASTY;  Surgeon: Corky Mull, MD;  Location: [facility];  Service: Orthopedics;  Laterality: Right;   TOTAL HIP ARTHROPLASTY Left 03/06/2020   Procedure: TOTAL HIP ARTHROPLASTY;  Surgeon: Corky Mull, MD;  Location: [facility];  Service: Orthopedics;  Laterality: Left;   TOTAL KNEE ARTHROPLASTY Left 10/20/2019   Procedure: TOTAL KNEE ARTHROPLASTY;  Surgeon: Corky Mull, MD;  Location: [facility];  Service: Orthopedics;  Laterality: Left;    Social History   Tobacco Use   Smoking status: Never   Smokeless tobacco: Never   Tobacco comments:    smoking cessation materials not required  Vaping Use   Vaping Use: Never used  Substance Use Topics   Alcohol use: Not Currently   Drug use: No     Medication list has been reviewed and updated.  Current Meds  Medication Sig   acetaminophen (TYLENOL) 500 MG tablet Take 1,000 mg by mouth every 8 (eight) hours as needed for moderate pain.    aspirin EC 81 MG tablet Take 81 mg by mouth daily. Swallow whole.   atenolol (TENORMIN) 50 MG tablet Take 1 tablet (50 mg total) by mouth daily.   fenofibrate (TRICOR) 145 MG tablet Take 0.5 tablets (72.5 mg total) by mouth at bedtime.   folic acid (FOLVITE) 1 MG tablet Take 1 mg by mouth  daily.    lisinopril (ZESTRIL) 10 MG tablet TAKE (1) TABLET BY MOUTH EVERY DAY   methotrexate (RHEUMATREX) 2.5 MG tablet Take 15 mg by mouth every Saturday.    Multiple Vitamins-Minerals (CENTRUM SILVER PO) Take 1 tablet by mouth daily.   Omega-3 Fatty Acids (FISH OIL) 1000 MG CAPS Take 2,000 mg by mouth in the morning and at bedtime.    tamsulosin (FLOMAX) 0.4 MG CAPS capsule Take 1 capsule (0.4 mg total) by mouth daily.   vitamin B-12 (CYANOCOBALAMIN) 1000 MCG tablet Take 1,000 mcg by mouth daily.       01/02/2023    9:00 AM 07/02/2022    8:30 AM 12/20/2021    8:29 AM 06/18/2021    8:22 AM  GAD 7 : Generalized Anxiety Score  Nervous, Anxious, on Edge 0 0 0 0  Control/stop worrying 0 0 0 0  Worry too much - different things 0 0 0 0  Trouble relaxing 0 0 0 0  Restless 0 0 0 0  Easily annoyed or irritable 0 0 0 0  Afraid - awful might happen 0 0 0 0  Total GAD 7 Score 0 0 0 0  Anxiety Difficulty Not  difficult at all Not difficult at all Not difficult at all        01/02/2023    9:00 AM 07/02/2022    8:30 AM 02/17/2022   11:39 AM  Depression screen PHQ 2/9  Decreased Interest 0 0 0  Down, Depressed, Hopeless 0 0 0  PHQ - 2 Score 0 0 0  Altered sleeping 0 0 0  Tired, decreased energy 0 0 0  Change in appetite 0 0 0  Feeling bad or failure about yourself  0 0 0  Trouble concentrating 0 0 0  Moving slowly or fidgety/restless 0 0 0  Suicidal thoughts 0 0 0  PHQ-9 Score 0 0 0  Difficult doing work/chores Not difficult at all Not difficult at all Not difficult at all    BP Readings from Last 3 Encounters:  01/02/23 120/78  07/02/22 120/64  12/20/21 124/80    Physical Exam Vitals and nursing note reviewed.  HENT:     Head: Normocephalic.     Right Ear: Tympanic membrane and external ear normal.     Left Ear: Tympanic membrane and external ear normal.     Nose: Nose normal. No congestion or rhinorrhea.     Mouth/Throat:     Mouth: Mucous membranes are moist.     Pharynx: No  oropharyngeal exudate or posterior oropharyngeal erythema.  Eyes:     General: No scleral icterus.       Right eye: No discharge.        Left eye: No discharge.     Conjunctiva/sclera: Conjunctivae normal.     Pupils: Pupils are equal, round, and reactive to light.  Neck:     Thyroid: No thyromegaly.     Vascular: No carotid bruit or JVD.     Trachea: No tracheal deviation.  Cardiovascular:     Rate and Rhythm: Normal rate and regular rhythm.     Heart sounds: Normal heart sounds. No murmur heard.    No friction rub. No gallop.  Pulmonary:     Effort: No respiratory distress.     Breath sounds: Normal breath sounds. No wheezing, rhonchi or rales.  Abdominal:     General: Bowel sounds are normal.     Palpations: Abdomen is soft. There is no mass.     Tenderness: There is no abdominal tenderness. There is no guarding or rebound.  Musculoskeletal:        General: No tenderness. Normal range of motion.     Cervical back: Normal range of motion and neck supple.  Lymphadenopathy:     Cervical: No cervical adenopathy.  Skin:    General: Skin is warm.     Findings: No rash.  Neurological:     Mental Status: He is alert.     Wt Readings from Last 3 Encounters:  01/02/23 181 lb (82.1 kg)  07/02/22 183 lb (83 kg)  12/20/21 186 lb (84.4 kg)    BP 120/78   Pulse 60   Ht '5\' 10"'$  (1.778 m)   Wt 181 lb (82.1 kg)   SpO2 99%   BMI 25.97 kg/m   Assessment and Plan:  1. Essential hypertension Chronic.  Controlled.  Stable.  Continue atenolol 50 mg once a day and lisinopril 10 mg once a day.  Will check CMP for electrolytes and GFR. - atenolol (TENORMIN) 50 MG tablet; Take 1 tablet (50 mg total) by mouth daily.  Dispense: 90 tablet; Refill: 1 - lisinopril (ZESTRIL) 10 MG tablet; TAKE (1) TABLET  BY MOUTH EVERY DAY  Dispense: 90 tablet; Refill: 1 - Comprehensive Metabolic Panel (CMET)  2. Mixed hyperlipidemia Chronic.  Controlled.  Stable.  Continue fenofibrate 145 mg 1/2 tablet  nightly.  Will check lipid panel for current level of control. - fenofibrate (TRICOR) 145 MG tablet; Take 0.5 tablets (72.5 mg total) by mouth at bedtime.  Dispense: 45 tablet; Refill: 1 - Lipid Panel With LDL/HDL Ratio  3. Benign prostatic hyperplasia with nocturia Chronic.  Controlled.  Stable.  Continue Flomax 0.4 mg once a day.  Will check PSA for current status of prostate. - tamsulosin (FLOMAX) 0.4 MG CAPS capsule; Take 1 capsule (0.4 mg total) by mouth daily.  Dispense: 90 capsule; Refill: 1 - PSA  4. Need for immunization against influenza Discussed and administered - Flu Vaccine QUAD High Dose(Fluad)  5. Chronic pain of right knee New problem but chronic issue.  Patient has had surgery on the left - DG knee right will obtain x-ray and appointment has been made with sports medicine.   Otilio Miu, MD

## 2023-01-03 LAB — COMPREHENSIVE METABOLIC PANEL
ALT: 15 IU/L (ref 0–44)
AST: 18 IU/L (ref 0–40)
Albumin/Globulin Ratio: 2.4 — ABNORMAL HIGH (ref 1.2–2.2)
Albumin: 4.6 g/dL (ref 3.7–4.7)
Alkaline Phosphatase: 41 IU/L — ABNORMAL LOW (ref 44–121)
BUN/Creatinine Ratio: 17 (ref 10–24)
BUN: 20 mg/dL (ref 8–27)
Bilirubin Total: 0.7 mg/dL (ref 0.0–1.2)
CO2: 23 mmol/L (ref 20–29)
Calcium: 9.8 mg/dL (ref 8.6–10.2)
Chloride: 104 mmol/L (ref 96–106)
Creatinine, Ser: 1.15 mg/dL (ref 0.76–1.27)
Globulin, Total: 1.9 g/dL (ref 1.5–4.5)
Glucose: 110 mg/dL — ABNORMAL HIGH (ref 70–99)
Potassium: 4.6 mmol/L (ref 3.5–5.2)
Sodium: 140 mmol/L (ref 134–144)
Total Protein: 6.5 g/dL (ref 6.0–8.5)
eGFR: 64 mL/min/{1.73_m2} (ref >59)

## 2023-01-03 LAB — LIPID PANEL WITH LDL/HDL RATIO
Cholesterol, Total: 134 mg/dL (ref 100–199)
HDL: 34 mg/dL — ABNORMAL LOW (ref >39)
LDL Chol Calc (NIH): 76 mg/dL (ref 0–99)
LDL/HDL Ratio: 2.2 ratio (ref 0.0–3.6)
Triglycerides: 137 mg/dL (ref 0–149)
VLDL Cholesterol Cal: 24 mg/dL (ref 5–40)

## 2023-01-03 LAB — PSA: Prostate Specific Ag, Serum: 1.9 ng/mL (ref 0.0–4.0)

## 2023-01-05 NOTE — Progress Notes (Signed)
PC to pt, discussed lab and xray results, pt voiced understanding.

## 2023-01-13 ENCOUNTER — Encounter: Payer: Self-pay | Admitting: *Deleted

## 2023-01-13 ENCOUNTER — Telehealth: Payer: Self-pay | Admitting: *Deleted

## 2023-01-13 ENCOUNTER — Ambulatory Visit: Payer: Medicare PPO | Admitting: Family Medicine

## 2023-01-13 ENCOUNTER — Encounter: Payer: Self-pay | Admitting: Family Medicine

## 2023-01-13 ENCOUNTER — Inpatient Hospital Stay (INDEPENDENT_AMBULATORY_CARE_PROVIDER_SITE_OTHER): Payer: Medicare PPO | Admitting: Radiology

## 2023-01-13 VITALS — BP 110/72 | HR 60 | Ht 70.0 in | Wt 183.0 lb

## 2023-01-13 DIAGNOSIS — M1711 Unilateral primary osteoarthritis, right knee: Secondary | ICD-10-CM

## 2023-01-13 MED ORDER — TRIAMCINOLONE ACETONIDE 40 MG/ML IJ SUSP
40.0000 mg | Freq: Once | INTRAMUSCULAR | Status: AC
  Administered 2023-01-13: 40 mg via INTRAMUSCULAR

## 2023-01-13 NOTE — Patient Instructions (Addendum)
You have just been given a cortisone injection to reduce pain and inflammation. After the injection you may notice immediate relief of pain as a result of the Lidocaine. It is important to rest the area of the injection for 24 to 48 hours after the injection. There is a possibility of some temporary increased discomfort and swelling for up to 72 hours until the cortisone begins to work. If you do have pain, simply rest the joint and use ice. If you can tolerate over the counter medications, you can try Tylenol for added relief per package instructions. - As above, relative rest x 2 days then gradual return to normal activity - Return in 1 month

## 2023-01-13 NOTE — Assessment & Plan Note (Signed)
Patient with 2-week history of right primarily medial knee pain in the setting of relative overuse (putting away Christmas decorations in attic).  Has noted swelling, pain with driving and walking, mild radiation proximally and distally, has been dosing Tylenol with modest improvement.  Examination with 1-2+ effusion, range of motion 0-130, mild pain with terminal flexion, tenderness about the medial joint line maximally, secondarily to the medial patellar facet, no ligamentous laxity with anterior/posterior stressing, varus/valgus stressing negative, negative McMurray's.  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  Given his comorbid medical history we reviewed various treatment strategies and he did elect to proceed with ultrasound-guided aspiration followed by corticosteroid injection.  Post care reviewed, we will coordinate a follow-up in 1 month for reevaluation.  He would benefit from home-based versus formal PT, consideration of viscosupplementation for symptom maintenance.

## 2023-01-13 NOTE — Progress Notes (Signed)
     Primary Care / Sports Medicine Office Visit  Patient Information:  Patient ID: Howell Groesbeck, male DOB: [birth year] Age: 82 y.o. MRN: [id number]   Nicholas Monroe is a pleasant 82 y.o. male presenting with the following:  Chief Complaint  Patient presents with   Knee Pain    Right 2 weeks,     Vitals:   01/13/23 0933  BP: 110/72  Pulse: 60  SpO2: 98%   Vitals:   01/13/23 0933  Weight: 183 lb (83 kg)  Height: '5\' 10"'$  (1.778 m)   Body mass index is 26.26 kg/m.     Independent interpretation of notes and tests performed by another provider:   Independent interpretation of right knee x-rays dated 01/02/2023 demonstrates osteoarthritis, moderate in nature primarily involving the patellofemoral and medial tibiofemoral compartments, no acute osseous processes identified.  Procedures performed:   Procedure:  Injection after aspiration of right knee under ultrasound guidance. Ultrasound guidance utilized for suprapatellar approach, effusion visualized Samsung HS60 device utilized with permanent recording / reporting. Verbal informed consent obtained and verified. Skin prepped in a sterile fashion. Ethyl chloride for topical local analgesia.  Completed without difficulty and tolerated well. Aspirate: 11 cc noninflammatory synovial fluid Medication: triamcinolone acetonide 40 mg/mL suspension for injection 1 mL total and 2 mL lidocaine 1% without epinephrine utilized for needle placement anesthetic Advised to contact for fevers/chills, erythema, induration, drainage, or persistent bleeding.   Pertinent History, Exam, Impression, and Recommendations:   Nicholas Monroe was seen today for knee pain.  Primary osteoarthritis of right knee Assessment & Plan: Patient with 2-week history of right primarily medial knee pain in the setting of relative overuse (putting away Christmas decorations in attic).  Has noted swelling, pain with driving and walking, mild radiation proximally and  distally, has been dosing Tylenol with modest improvement.  Examination with 1-2+ effusion, range of motion 0-130, mild pain with terminal flexion, tenderness about the medial joint line maximally, secondarily to the medial patellar facet, no ligamentous laxity with anterior/posterior stressing, varus/valgus stressing negative, negative McMurray's.  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  Given his comorbid medical history we reviewed various treatment strategies and he did elect to proceed with ultrasound-guided aspiration followed by corticosteroid injection.  Post care reviewed, we will coordinate a follow-up in 1 month for reevaluation.  He would benefit from home-based versus formal PT, consideration of viscosupplementation for symptom maintenance.  Orders: -     [place] LIMITED JOINT SPACE STRUCTURES LOW RIGHT; Future -     Triamcinolone Acetonide     Orders & Medications Meds ordered this encounter  Medications   triamcinolone acetonide (KENALOG-40) injection 40 mg   Orders Placed This Encounter  Procedures   [place] LIMITED JOINT SPACE STRUCTURES LOW RIGHT     Return in about 4 weeks (around 02/10/2023) for f/u right knee.     Montel Culver, MD, [facility]   Primary Care Sports Medicine Primary Care and Sports Medicine at [facility]

## 2023-01-13 NOTE — Patient Outreach (Signed)
  Care Coordination   Initial Visit Note   01/13/2023 Name: Nicholas Monroe MRN: [id number] DOB: 1941-03-30  Nicholas Monroe is a 83 y.o. year old male who sees Juline Patch, MD for primary care. I spoke with Nicholas Monroe, wife of Nicholas Monroe by phone today.  What matters to the patients health and wellness today?  Wife report patient is doing well, seeing sports medicine team for chronic joint pain (knee).  Does not feel follow up is needed at this time but will call with questions.      Goals Addressed             This Visit's Progress    COMPLETED: Care Coordination Acivities - No follow up needed       Care Coordination Interventions: Evaluation of current treatment plan related to chronic joint pain and patient's adherence to plan as established by provider Advised patient to follow guidelines of ortho specialist/sports medicine team Reviewed medications with patient and discussed adherence and affordability Reviewed scheduled/upcoming provider appointments including Ortho on 2/27, [facility] on 3/11, and PCP on 7/19 Discussed plans with patient for ongoing care management follow up and provided patient with direct contact information for care management team Screening for signs and symptoms of depression related to chronic disease state  Assessed social determinant of health barriers         SDOH assessments and interventions completed:  Yes  SDOH Interventions Today    Flowsheet Row Most Recent Value  SDOH Interventions   Food Insecurity Interventions Intervention Not Indicated  Housing Interventions Intervention Not Indicated  Transportation Interventions Intervention Not Indicated        Care Coordination Interventions:  Yes, provided   Follow up plan: No further intervention required.   Encounter Outcome:  Pt. Visit Completed   Valente David, RN, MSN, [place] [facility] Care Management Coordinator [phone]

## 2023-02-06 ENCOUNTER — Telehealth: Payer: Self-pay | Admitting: Family Medicine

## 2023-02-06 NOTE — Telephone Encounter (Signed)
Contacted Nicholas Monroe to schedule their annual wellness visit. Appointment made for 02/26/2023.  Sherol Dade; Care Guide [facility] Direct Dial: [phone]

## 2023-02-09 DIAGNOSIS — Z961 Presence of intraocular lens: Secondary | ICD-10-CM | POA: Diagnosis not present

## 2023-02-09 DIAGNOSIS — Z0100 Encounter for examination of eyes and vision without abnormal findings: Secondary | ICD-10-CM | POA: Diagnosis not present

## 2023-02-10 ENCOUNTER — Ambulatory Visit: Payer: Medicare PPO | Admitting: Family Medicine

## 2023-02-23 ENCOUNTER — Ambulatory Visit: Payer: Medicare PPO

## 2023-02-26 ENCOUNTER — Ambulatory Visit (INDEPENDENT_AMBULATORY_CARE_PROVIDER_SITE_OTHER): Payer: Medicare PPO

## 2023-02-26 VITALS — Ht 70.0 in | Wt 183.0 lb

## 2023-02-26 DIAGNOSIS — Z0000 Encounter for general adult medical examination without abnormal findings: Secondary | ICD-10-CM

## 2023-02-26 NOTE — Patient Instructions (Signed)
Nicholas Monroe , Thank you for taking time to come for your Medicare Wellness Visit. I appreciate your ongoing commitment to your health goals. Please review the following plan we discussed and let me know if I can assist you in the future.   These are the goals we discussed:  Goals      DIET - EAT MORE FRUITS AND VEGETABLES     Exercise 150 min/wk Moderate Activity     Recommend to exercise for at least 150 minutes per week.         This is a list of the screening recommended for you and due dates:  Health Maintenance  Topic Date Due   Zoster (Shingles) Vaccine (1 of 2) 04/03/2023*   Medicare Annual Wellness Visit  02/26/2024   DTaP/Tdap/Td vaccine (2 - Td or Tdap) 01/14/2027   Pneumonia Vaccine  Completed   Flu Shot  Completed   HPV Vaccine  Aged Out   COVID-19 Vaccine  Discontinued  *Topic was postponed. The date shown is not the original due date.    Advanced directives:    Conditions/risks identified: none  Next appointment: Follow up in one year for your annual wellness visit. 03/02/24 @ 2:00 pm by phone  Preventive Care 65 Years and Older, Male  Preventive care refers to lifestyle choices and visits with your health care provider that can promote health and wellness. What does preventive care include? A yearly physical exam. This is also called an annual well check. Dental exams once or twice a year. Routine eye exams. Ask your health care provider how often you should have your eyes checked. Personal lifestyle choices, including: Daily care of your teeth and gums. Regular physical activity. Eating a healthy diet. Avoiding tobacco and drug use. Limiting alcohol use. Practicing safe sex. Taking low doses of aspirin every day. Taking vitamin and mineral supplements as recommended by your health care provider. What happens during an annual well check? The services and screenings done by your health care provider during your annual well check will depend on your age,  overall health, lifestyle risk factors, and family history of disease. Counseling  Your health care provider may ask you questions about your: Alcohol use. Tobacco use. Drug use. Emotional well-being. Home and relationship well-being. Sexual activity. Eating habits. History of falls. Memory and ability to understand (cognition). Work and work Statistician. Screening  You may have the following tests or measurements: Height, weight, and BMI. Blood pressure. Lipid and cholesterol levels. These may be checked every 5 years, or more frequently if you are over 71 years old. Skin check. Lung cancer screening. You may have this screening every year starting at age 82 if you have a 30-pack-year history of smoking and currently smoke or have quit within the past 15 years. Fecal occult blood test (FOBT) of the stool. You may have this test every year starting at age 82. Flexible sigmoidoscopy or colonoscopy. You may have a sigmoidoscopy every 5 years or a colonoscopy every 10 years starting at age 82. Prostate cancer screening. Recommendations will vary depending on your family history and other risks. Hepatitis C blood test. Hepatitis B blood test. Sexually transmitted disease (STD) testing. Diabetes screening. This is done by checking your blood sugar (glucose) after you have not eaten for a while (fasting). You may have this done every 1-3 years. Abdominal aortic aneurysm (AAA) screening. You may need this if you are a current or former smoker. Osteoporosis. You may be screened starting at age 82  if you are at high risk. Talk with your health care provider about your test results, treatment options, and if necessary, the need for more tests. Vaccines  Your health care provider may recommend certain vaccines, such as: Influenza vaccine. This is recommended every year. Tetanus, diphtheria, and acellular pertussis (Tdap, Td) vaccine. You may need a Td booster every 10 years. Zoster vaccine.  You may need this after age 82. Pneumococcal 13-valent conjugate (PCV13) vaccine. One dose is recommended after age 82. Pneumococcal polysaccharide (PPSV23) vaccine. One dose is recommended after age 82. Talk to your health care provider about which screenings and vaccines you need and how often you need them. This information is not intended to replace advice given to you by your health care provider. Make sure you discuss any questions you have with your health care provider. Document Released: 12/28/2015 Document Revised: 08/20/2016 Document Reviewed: 10/02/2015 Elsevier Interactive Patient Education  2017 [place] Prevention in the Home Falls can cause injuries. They can happen to people of all ages. There are many things you can do to make your home safe and to help prevent falls. What can I do on the outside of my home? Regularly fix the edges of walkways and driveways and fix any cracks. Remove anything that might make you trip as you walk through a door, such as a raised step or threshold. Trim any bushes or trees on the path to your home. Use bright outdoor lighting. Clear any walking paths of anything that might make someone trip, such as rocks or tools. Regularly check to see if handrails are loose or broken. Make sure that both sides of any steps have handrails. Any raised decks and porches should have guardrails on the edges. Have any leaves, snow, or ice cleared regularly. Use sand or salt on walking paths during winter. Clean up any spills in your garage right away. This includes oil or grease spills. What can I do in the bathroom? Use night lights. Install grab bars by the toilet and in the tub and shower. Do not use towel bars as grab bars. Use non-skid mats or decals in the tub or shower. If you need to sit down in the shower, use a plastic, non-slip stool. Keep the floor dry. Clean up any water that spills on the floor as soon as it happens. Remove soap  buildup in the tub or shower regularly. Attach bath mats securely with double-sided non-slip rug tape. Do not have throw rugs and other things on the floor that can make you trip. What can I do in the bedroom? Use night lights. Make sure that you have a light by your bed that is easy to reach. Do not use any sheets or blankets that are too big for your bed. They should not hang down onto the floor. Have a firm chair that has side arms. You can use this for support while you get dressed. Do not have throw rugs and other things on the floor that can make you trip. What can I do in the kitchen? Clean up any spills right away. Avoid walking on wet floors. Keep items that you use a lot in easy-to-reach places. If you need to reach something above you, use a strong step stool that has a grab bar. Keep electrical cords out of the way. Do not use floor polish or wax that makes floors slippery. If you must use wax, use non-skid floor wax. Do not have throw rugs and other things  on the floor that can make you trip. What can I do with my stairs? Do not leave any items on the stairs. Make sure that there are handrails on both sides of the stairs and use them. Fix handrails that are broken or loose. Make sure that handrails are as long as the stairways. Check any carpeting to make sure that it is firmly attached to the stairs. Fix any carpet that is loose or worn. Avoid having throw rugs at the top or bottom of the stairs. If you do have throw rugs, attach them to the floor with carpet tape. Make sure that you have a light switch at the top of the stairs and the bottom of the stairs. If you do not have them, ask someone to add them for you. What else can I do to help prevent falls? Wear shoes that: Do not have high heels. Have rubber bottoms. Are comfortable and fit you well. Are closed at the toe. Do not wear sandals. If you use a stepladder: Make sure that it is fully opened. Do not climb a closed  stepladder. Make sure that both sides of the stepladder are locked into place. Ask someone to hold it for you, if possible. Clearly mark and make sure that you can see: Any grab bars or handrails. First and last steps. Where the edge of each step is. Use tools that help you move around (mobility aids) if they are needed. These include: Canes. Walkers. Scooters. Crutches. Turn on the lights when you go into a dark area. Replace any light bulbs as soon as they burn out. Set up your furniture so you have a clear path. Avoid moving your furniture around. If any of your floors are uneven, fix them. If there are any pets around you, be aware of where they are. Review your medicines with your doctor. Some medicines can make you feel dizzy. This can increase your chance of falling. Ask your doctor what other things that you can do to help prevent falls. This information is not intended to replace advice given to you by your health care provider. Make sure you discuss any questions you have with your health care provider. Document Released: 09/27/2009 Document Revised: 05/08/2016 Document Reviewed: 01/05/2015 Elsevier Interactive Patient Education  2017 [employer].

## 2023-02-26 NOTE — Progress Notes (Signed)
I connected with  [name] on 02/26/23 by a audio enabled telemedicine application and verified that I am speaking with the correct person using two identifiers.  Patient Location: Home  Provider Location: Office/Clinic  I discussed the limitations of evaluation and management by telemedicine. The patient expressed understanding and agreed to proceed.  Subjective:   Jayr Decastro is a 82 y.o. male who presents for Medicare Annual/Subsequent preventive examination.  Review of Systems     Cardiac Risk Factors include: advanced age (>7mn, >>39women);male gender;hypertension;dyslipidemia     Objective:    There were no vitals filed for this visit. There is no height or weight on file to calculate BMI.     02/26/2023    9:02 AM 02/17/2022   11:40 AM 02/13/2021   11:42 AM 03/06/2020    4:01 PM 03/06/2020    8:57 AM 02/23/2020    1:43 PM 02/08/2020    1:30 PM  Advanced Directives  Does Patient Have a Medical Advance Directive? No Yes Yes Yes Yes Yes Yes  Type of ASocial research officer, governmentLiving will HO[place]Living will [place]Living will Healthcare Power of [place]ost[place]Living will [place]Living will  Does patient want to make changes to medical advance directive?    No - Patient declined No - Patient declined No - Patient declined   Copy of [place] Chart?  No - copy requested No - copy requested No - copy requested No - copy requested No - copy requested No - copy requested  Would patient like information on creating a medical advance directive? No - Patient declined   No - Patient declined No - Patient declined No - Patient declined     Current Medications (verified) Outpatient Encounter Medications as of 02/26/2023  Medication Sig   acetaminophen (TYLENOL) 500 MG tablet Take 1,000 mg by mouth every 8 (eight) hours as needed for moderate  pain.    aspirin EC 81 MG tablet Take 81 mg by mouth daily. Swallow whole.   atenolol (TENORMIN) 50 MG tablet Take 1 tablet (50 mg total) by mouth daily.   fenofibrate (TRICOR) 145 MG tablet Take 0.5 tablets (72.5 mg total) by mouth at bedtime.   folic acid (FOLVITE) 1 MG tablet Take 1 mg by mouth daily.    lisinopril (ZESTRIL) 10 MG tablet TAKE (1) TABLET BY MOUTH EVERY DAY   methotrexate (RHEUMATREX) 2.5 MG tablet Take 15 mg by mouth every Saturday.    Multiple Vitamins-Minerals (CENTRUM SILVER PO) Take 1 tablet by mouth daily.   Omega-3 Fatty Acids (FISH OIL) 1000 MG CAPS Take 2,000 mg by mouth in the morning and at bedtime.    tamsulosin (FLOMAX) 0.4 MG CAPS capsule Take 1 capsule (0.4 mg total) by mouth daily.   vitamin B-12 (CYANOCOBALAMIN) 1000 MCG tablet Take 1,000 mcg by mouth daily.   No facility-administered encounter medications on file as of 02/26/2023.    Allergies (verified) Patient has no known allergies.   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 vision   Past Surgical History:  Procedure Laterality Date   APPENDECTOMY     COLONOSCOPY  01/2009   normal- Dr BBary Castilla  COLONOSCOPY WITH PROPOFOL N/A 11/03/2018   Procedure: COLONOSCOPY WITH PROPOFOL;  Surgeon: BRobert Bellow MD;  Location: [facility];  Service: Endoscopy;  Laterality: N/A;   EYE SURGERY Bilateral    cataracts   HERNIA REPAIR     TOTAL HIP ARTHROPLASTY Right 07/06/2015   Procedure: TOTAL HIP ARTHROPLASTY;  Surgeon: Corky Mull, MD;  Location: [facility];  Service: Orthopedics;  Laterality: Right;   TOTAL HIP ARTHROPLASTY Left 03/06/2020   Procedure: TOTAL HIP ARTHROPLASTY;  Surgeon: Corky Mull, MD;  Location: [facility];  Service: Orthopedics;  Laterality: Left;   TOTAL KNEE ARTHROPLASTY Left 10/20/2019   Procedure: TOTAL KNEE ARTHROPLASTY;  Surgeon: Corky Mull, MD;  Location: [facility];   Service: Orthopedics;  Laterality: Left;   Family History  Problem Relation Age of Onset   Healthy Father    Diabetes Brother    Hypertension Mother    Colon cancer Mother 23   Diabetes Daughter        type 1   Diabetes Brother    Stroke Brother    Social History   Socioeconomic History   Marital status: Married    Spouse name: Not on file   Number of children: 2   Years of education: Not on file   Highest education level: Master's degree (e.g., MA, MS, MEng, MEd, MSW, MBA)  Occupational History   Occupation: Retired  Tobacco Use   Smoking status: Never   Smokeless tobacco: Never   Tobacco comments:    smoking cessation materials not required  Vaping Use   Vaping Use: Never used  Substance and Sexual Activity   Alcohol use: Not Currently   Drug use: No   Sexual activity: Not Currently  Other Topics Concern   Not on file  Social History Narrative   Not on file   Social Determinants of Health   Financial Resource Strain: Low Risk  (02/26/2023)   Overall Financial Resource Strain (CARDIA)    Difficulty of Paying Living Expenses: Not hard at all  Food Insecurity: No Food Insecurity (02/26/2023)   Hunger Vital Sign    Worried About Running Out of Food in the Last Year: Never true    Smock in the Last Year: Never true  Transportation Needs: No Transportation Needs (02/26/2023)   PRAPARE - Hydrologist (Medical): No    Lack of Transportation (Non-Medical): No  Physical Activity: Insufficiently Active (02/26/2023)   Exercise Vital Sign    Days of Exercise per Week: 3 days    Minutes of Exercise per Session: 30 min  Stress: No Stress Concern Present (02/26/2023)   [place]    Feeling of Stress : Only a little  Social Connections: Socially Integrated (02/26/2023)   Social Connection and Isolation Panel [NHANES]    Frequency of Communication with Friends and Family:  More than three times a week    Frequency of Social Gatherings with Friends and Family: Once a week    Attends Religious Services: More than 4 times per year    Active Member of [employer] or Organizations: Yes    Attends Music therapist: More than 4 times per year    Marital Status: Married    Tobacco Counseling Counseling given: Not Answered Tobacco comments: smoking cessation materials not required   Clinical Intake:  Pre-visit preparation completed: Yes  Pain : No/denies pain     Nutritional Risks: None Diabetes: No  How often do you need to have someone help you when you read instructions, pamphlets, or  other written materials from your doctor or pharmacy?: 1 - Never  Diabetic?no  Interpreter Needed?: No  Information entered by :: Kirke Shaggy, LPN   Activities of Daily Living    02/26/2023    9:03 AM  In your present state of health, do you have any difficulty performing the following activities:  Hearing? 1  Vision? 0  Difficulty concentrating or making decisions? 0  Walking or climbing stairs? 0  Dressing or bathing? 0  Doing errands, shopping? 0  Preparing Food and eating ? N  Using the Toilet? N  In the past six months, have you accidently leaked urine? N  Do you have problems with loss of bowel control? N  Managing your Medications? N  Managing your Finances? N  Housekeeping or managing your Housekeeping? N    Patient Care Team: Juline Patch, MD as PCP - General (Family Medicine) Vladimir Crofts, MD as Consulting Physician (Neurology) Poggi, Marshall Cork, MD as Consulting Physician (Orthopedic Surgery) Valente David, RN as [place] any recent Terry you may have received from other than [facility] providers in the past year (date may be approximate).     Assessment:   This is a routine wellness examination for [place].  Hearing/Vision screen Hearing Screening - Comments:: No aids Vision  Screening - Comments:: No glasses- [facility]  Dietary issues and exercise activities discussed: Current Exercise Habits: Home exercise routine, Type of exercise: walking, Time (Minutes): 30, Frequency (Times/Week): 3, Weekly Exercise (Minutes/Week): 90   Goals Addressed             This Visit's Progress    DIET - EAT MORE FRUITS AND VEGETABLES         Depression Screen    02/26/2023    8:59 AM 01/02/2023    9:00 AM 07/02/2022    8:30 AM 02/17/2022   11:39 AM 12/20/2021    8:29 AM 06/18/2021    8:22 AM 02/13/2021   11:40 AM  PHQ 2/9 Scores  PHQ - 2 Score 0 0 0 0 0 0 0  PHQ- 9 Score 0 0 0 0 0 0     Fall Risk    02/26/2023    9:03 AM 01/02/2023    9:00 AM 07/02/2022    8:29 AM 02/17/2022   11:46 AM 02/13/2021   11:44 AM  Fall Risk   Falls in the past year? 0 0 0 0 0  Number falls in past yr: 0 0 0 0 0  Injury with Fall? 0 0 0 0 0  Risk for fall due to : No Fall Risks No Fall Risks No Fall Risks No Fall Risks No Fall Risks  Follow up Falls prevention discussed;Falls evaluation completed Falls evaluation completed Falls evaluation completed Falls prevention discussed Falls prevention discussed    FALL RISK PREVENTION PERTAINING TO THE HOME:  Any stairs in or around the home? Yes  If so, are there any without handrails? No  Home free of loose throw rugs in walkways, pet beds, electrical cords, etc? Yes  Adequate lighting in your home to reduce risk of falls? Yes   ASSISTIVE DEVICES UTILIZED TO PREVENT FALLS:  Life alert? No  Use of a cane, walker or w/c? No  Grab bars in the bathroom? Yes  Shower chair or bench in shower? No  Elevated toilet seat or a handicapped toilet? Yes   Cognitive Function:        02/26/2023    9:06  AM 01/03/2019    1:59 PM 12/30/2017    8:28 AM  6CIT Screen  What Year? 0 points 0 points 0 points  What month? 0 points 0 points 0 points  What time? 0 points 0 points 0 points  Count back from 20 0 points 0 points 0 points  Months in reverse 0  points 0 points 0 points  Repeat phrase 0 points 0 points 0 points  Total Score 0 points 0 points 0 points    Immunizations Immunization History  Administered Date(s) Administered   Fluad Quad(high Dose 65+) 12/29/2019, 01/01/2021, 12/20/2021, 01/02/2023   Influenza, High Dose Seasonal PF 10/23/2017, 10/05/2018   Influenza,inj,Quad PF,6+ Mos 09/27/2015   PFIZER(Purple Top)SARS-COV-2 Vaccination 01/20/2020, 02/10/2020   Pneumococcal Conjugate-13 01/14/2017   Pneumococcal Polysaccharide-23 07/06/2018   Tdap 01/14/2017    TDAP status: Up to date  Flu Vaccine status: Up to date  Pneumococcal vaccine status: Up to date  Covid-19 vaccine status: Completed vaccines  Qualifies for Shingles Vaccine? Yes   Zostavax completed No   Shingrix Completed?: No.    Education has been provided regarding the importance of this vaccine. Patient has been advised to call insurance company to determine out of pocket expense if they have not yet received this vaccine. Advised may also receive vaccine at local pharmacy or Health Dept. Verbalized acceptance and understanding.  Screening Tests Health Maintenance  Topic Date Due   Zoster Vaccines- Shingrix (1 of 2) 04/03/2023 (Originally 07/27/1991)   Medicare Annual Wellness (AWV)  02/26/2024   DTaP/Tdap/Td (2 - Td or Tdap) 01/14/2027   Pneumonia Vaccine 63+ Years old  Completed   INFLUENZA VACCINE  Completed   HPV VACCINES  Aged Out   COVID-19 Vaccine  Discontinued    Health Maintenance  There are no preventive care reminders to display for this patient.   Colorectal cancer screening: No longer required.   Lung Cancer Screening: (Low Dose CT Chest recommended if Age 82-80 years, 30 pack-year currently smoking OR have quit w/in 15years.) does not qualify.   Additional Screening:  Hepatitis C Screening: does not qualify; Completed no  Vision Screening: Recommended annual ophthalmology exams for early detection of glaucoma and other disorders  of the eye. Is the patient up to date with their annual eye exam?  Yes  Who is the provider or what is the name of the office in which the patient attends annual eye exams? Dr.DIngledein If pt is not established with a provider, would they like to be referred to a provider to establish care? No .   Dental Screening: Recommended annual dental exams for proper oral hygiene  Community Resource Referral / Chronic Care Management: CRR required this visit?  No   CCM required this visit?  No      Plan:     I have personally reviewed and noted the following in the patient's chart:   Medical and social history Use of alcohol, tobacco or illicit drugs  Current medications and supplements including opioid prescriptions. Patient is not currently taking opioid prescriptions. Functional ability and status Nutritional status Physical activity Advanced directives List of other physicians Hospitalizations, surgeries, and ER visits in previous 12 months Vitals Screenings to include cognitive, depression, and falls Referrals and appointments  In addition, I have reviewed and discussed with patient certain preventive protocols, quality metrics, and best practice recommendations. A written personalized care plan for preventive services as well as general preventive health recommendations were provided to patient.     Denesha Brouse S  Drema Dallas, LPN   [id number]   Nurse Notes: none

## 2023-04-10 DIAGNOSIS — H903 Sensorineural hearing loss, bilateral: Secondary | ICD-10-CM | POA: Diagnosis not present

## 2023-04-10 DIAGNOSIS — H6121 Impacted cerumen, right ear: Secondary | ICD-10-CM | POA: Diagnosis not present

## 2023-04-30 DIAGNOSIS — H903 Sensorineural hearing loss, bilateral: Secondary | ICD-10-CM | POA: Diagnosis not present

## 2023-06-03 DIAGNOSIS — R4189 Other symptoms and signs involving cognitive functions and awareness: Secondary | ICD-10-CM | POA: Diagnosis not present

## 2023-06-03 DIAGNOSIS — G7000 Myasthenia gravis without (acute) exacerbation: Secondary | ICD-10-CM | POA: Diagnosis not present

## 2023-06-03 DIAGNOSIS — H9193 Unspecified hearing loss, bilateral: Secondary | ICD-10-CM | POA: Diagnosis not present

## 2023-06-29 ENCOUNTER — Other Ambulatory Visit: Payer: Self-pay | Admitting: Family Medicine

## 2023-06-29 DIAGNOSIS — I10 Essential (primary) hypertension: Secondary | ICD-10-CM

## 2023-07-03 ENCOUNTER — Ambulatory Visit (INDEPENDENT_AMBULATORY_CARE_PROVIDER_SITE_OTHER): Payer: Medicare PPO | Admitting: Family Medicine

## 2023-07-03 ENCOUNTER — Encounter: Payer: Self-pay | Admitting: Family Medicine

## 2023-07-03 VITALS — BP 100/72 | HR 99 | Ht 71.0 in | Wt 181.0 lb

## 2023-07-03 DIAGNOSIS — I10 Essential (primary) hypertension: Secondary | ICD-10-CM

## 2023-07-03 DIAGNOSIS — N401 Enlarged prostate with lower urinary tract symptoms: Secondary | ICD-10-CM

## 2023-07-03 DIAGNOSIS — E782 Mixed hyperlipidemia: Secondary | ICD-10-CM | POA: Diagnosis not present

## 2023-07-03 DIAGNOSIS — R351 Nocturia: Secondary | ICD-10-CM | POA: Diagnosis not present

## 2023-07-03 DIAGNOSIS — M1711 Unilateral primary osteoarthritis, right knee: Secondary | ICD-10-CM

## 2023-07-03 MED ORDER — FINASTERIDE 5 MG PO TABS: 5.0000 mg | ORAL_TABLET | Freq: Every day | ORAL | 1 refills | Status: AC

## 2023-07-03 MED ORDER — MELOXICAM 7.5 MG PO TABS: 7.5000 mg | ORAL_TABLET | Freq: Every day | ORAL | 0 refills | Status: AC

## 2023-07-03 NOTE — Progress Notes (Signed)
Date:  07/03/2023   Name:  Nicholas Monroe   DOB:  1941-03-08   MRN:  [id number]   Chief Complaint: Hyperlipidemia, Hypertension (No changes.), and Benign Prostatic Hypertrophy (Currently taking Flomax and would like to add another drug that Dr Yetta Barre spoke with him about. )  HPI  Lab Results  Component Value Date   NA 140 01/02/2023   K 4.6 01/02/2023   CO2 23 01/02/2023   GLUCOSE 110 (H) 01/02/2023   BUN 20 01/02/2023   CREATININE 1.15 01/02/2023   CALCIUM 9.8 01/02/2023   EGFR 64 01/02/2023   GFRNONAA 61 01/01/2021   Lab Results  Component Value Date   CHOL 134 01/02/2023   HDL 34 (L) 01/02/2023   LDLCALC 76 01/02/2023   TRIG 137 01/02/2023   CHOLHDL 4.3 01/06/2019   No results found for: "TSH" No results found for: "HGBA1C" Lab Results  Component Value Date   WBC 4.5 06/28/2020   HGB 12.7 (L) 06/28/2020   HCT 36.6 (L) 06/28/2020   MCV 92 06/28/2020   PLT 163 06/28/2020   Lab Results  Component Value Date   ALT 15 01/02/2023   AST 18 01/02/2023   ALKPHOS 41 (L) 01/02/2023   BILITOT 0.7 01/02/2023   No results found for: "25OHVITD2", "25OHVITD3", "VD25OH"   Review of Systems  Patient Active Problem List   Diagnosis Date Noted   Primary osteoarthritis of right knee 01/13/2023   Status post total hip replacement, left 03/06/2020   Benign prostatic hyperplasia with nocturia 12/29/2019   Status post total knee replacement using cement, left 10/20/2019   Lumbar spondylosis 03/04/2019   Primary osteoarthritis of left hip 03/04/2019   Encounter for screening colonoscopy 10/28/2018   Nocturia more than twice per night 12/30/2017   Hyperlipidemia 05/15/2016   Essential hypertension 05/15/2016   Closed right hip fracture (HCC) 07/05/2015    No Known Allergies  Past Surgical History:  Procedure Laterality Date   APPENDECTOMY     COLONOSCOPY  01/2009   normal- Dr Lemar Livings   COLONOSCOPY WITH PROPOFOL N/A 11/03/2018   Procedure: COLONOSCOPY WITH  PROPOFOL;  Surgeon: Earline Mayotte, MD;  Location: [facility];  Service: Endoscopy;  Laterality: N/A;   EYE SURGERY Bilateral    cataracts   HERNIA REPAIR     TOTAL HIP ARTHROPLASTY Right 07/06/2015   Procedure: TOTAL HIP ARTHROPLASTY;  Surgeon: Christena Flake, MD;  Location: [facility];  Service: Orthopedics;  Laterality: Right;   TOTAL HIP ARTHROPLASTY Left 03/06/2020   Procedure: TOTAL HIP ARTHROPLASTY;  Surgeon: Christena Flake, MD;  Location: [facility];  Service: Orthopedics;  Laterality: Left;   TOTAL KNEE ARTHROPLASTY Left 10/20/2019   Procedure: TOTAL KNEE ARTHROPLASTY;  Surgeon: Christena Flake, MD;  Location: [facility];  Service: Orthopedics;  Laterality: Left;    Social History   Tobacco Use   Smoking status: Never   Smokeless tobacco: Never   Tobacco comments:    smoking cessation materials not required  Vaping Use   Vaping status: Never Used  Substance Use Topics   Alcohol use: Not Currently   Drug use: No     Medication list has been reviewed and updated.  Current Meds  Medication Sig   acetaminophen (TYLENOL) 500 MG tablet Take 1,000 mg by mouth every 8 (eight) hours as needed for moderate pain.    aspirin EC 81 MG tablet Take 81 mg by mouth daily. Swallow whole.   atenolol (TENORMIN) 50 MG tablet TAKE (1)  TABLET BY MOUTH EVERY DAY   fenofibrate (TRICOR) 145 MG tablet Take 0.5 tablets (72.5 mg total) by mouth at bedtime.   finasteride (PROSCAR) 5 MG tablet Take 5 mg by mouth daily.   finasteride (PROSCAR) 5 MG tablet Take 1 tablet (5 mg total) by mouth daily.   folic acid (FOLVITE) 1 MG tablet Take 1 mg by mouth daily.    lisinopril (ZESTRIL) 10 MG tablet TAKE (1) TABLET BY MOUTH EVERY DAY   meloxicam (MOBIC) 7.5 MG tablet Take 1 tablet (7.5 mg total) by mouth daily.   methotrexate (RHEUMATREX) 2.5 MG tablet Take 15 mg by mouth every Saturday.    Multiple Vitamins-Minerals (CENTRUM SILVER PO) Take 1 tablet by mouth daily.   Omega-3 Fatty Acids (FISH OIL) 1000 MG  CAPS Take 2,000 mg by mouth in the morning and at bedtime.    tamsulosin (FLOMAX) 0.4 MG CAPS capsule Take 1 capsule (0.4 mg total) by mouth daily.   vitamin B-12 (CYANOCOBALAMIN) 1000 MCG tablet Take 1,000 mcg by mouth daily.       07/03/2023    8:49 AM 01/02/2023    9:00 AM 07/02/2022    8:30 AM 12/20/2021    8:29 AM  GAD 7 : Generalized Anxiety Score  Nervous, Anxious, on Edge 0 0 0 0  Control/stop worrying 0 0 0 0  Worry too much - different things 0 0 0 0  Trouble relaxing 0 0 0 0  Restless 0 0 0 0  Easily annoyed or irritable 0 0 0 0  Afraid - awful might happen 0 0 0 0  Total GAD 7 Score 0 0 0 0  Anxiety Difficulty Not difficult at all Not difficult at all Not difficult at all Not difficult at all       07/03/2023    8:49 AM 02/26/2023    8:59 AM 01/02/2023    9:00 AM  Depression screen PHQ 2/9  Decreased Interest 0 0 0  Down, Depressed, Hopeless 0 0 0  PHQ - 2 Score 0 0 0  Altered sleeping 0 0 0  Tired, decreased energy 0 0 0  Change in appetite 0 0 0  Feeling bad or failure about yourself  0 0 0  Trouble concentrating 0 0 0  Moving slowly or fidgety/restless 0 0 0  Suicidal thoughts 0 0 0  PHQ-9 Score 0 0 0  Difficult doing work/chores Not difficult at all Not difficult at all Not difficult at all    BP Readings from Last 3 Encounters:  07/03/23 100/72  01/13/23 110/72  01/02/23 120/78    Physical Exam  Wt Readings from Last 3 Encounters:  07/03/23 181 lb (82.1 kg)  02/26/23 183 lb (83 kg)  01/13/23 183 lb (83 kg)    BP 100/72   Pulse 99   Ht 5\' 11"  (1.803 m)   Wt 181 lb (82.1 kg)   SpO2 98%   BMI 25.24 kg/m   Assessment and Plan:  1. Essential hypertension See written note  2. Mixed hyperlipidemia   3. Benign prostatic hyperplasia with nocturia   4. Primary osteoarthritis of right knee  - meloxicam (MOBIC) 7.5 MG tablet; Take 1 tablet (7.5 mg total) by mouth daily.  Dispense: 30 tablet; Refill: 0    Elizabeth Sauer, MD

## 2023-07-10 ENCOUNTER — Telehealth: Payer: Self-pay | Admitting: Family Medicine

## 2023-07-10 NOTE — Telephone Encounter (Signed)
routed

## 2023-07-13 ENCOUNTER — Telehealth: Payer: Self-pay | Admitting: Family Medicine

## 2023-07-13 NOTE — Telephone Encounter (Signed)
ROUTED

## 2023-07-14 ENCOUNTER — Other Ambulatory Visit: Payer: Self-pay

## 2023-07-14 DIAGNOSIS — E782 Mixed hyperlipidemia: Secondary | ICD-10-CM

## 2023-07-14 DIAGNOSIS — I10 Essential (primary) hypertension: Secondary | ICD-10-CM | POA: Diagnosis not present

## 2023-08-14 ENCOUNTER — Ambulatory Visit
Admission: EM | Admit: 2023-08-14 | Discharge: 2023-08-14 | Disposition: A | Discharge status: Home / Self Care | Payer: Medicare PPO

## 2023-08-14 DIAGNOSIS — T162XXA Foreign body in left ear, initial encounter: Secondary | ICD-10-CM | POA: Diagnosis not present

## 2023-08-14 NOTE — ED Triage Notes (Addendum)
Patient states he has a piece of his hearing aid stuck in his left ear. This happened about 2 hours ago.

## 2023-08-14 NOTE — Discharge Instructions (Addendum)
Follow up with your hearing aid specialist as needed

## 2023-08-14 NOTE — ED Provider Notes (Signed)
[facility]    CSN: [id number] Arrival date & time: 08/14/23  1835      History   Chief Complaint No chief complaint on file.   HPI Nicholas Monroe is a 82 y.o. male who presents with a piece of his hearing aid stuck in his ear canal. Has been wearing them only for 3 months. Denies ear pain    Past Medical History:  Diagnosis Date   Arthritis    Hyperlipidemia    Hypertension    Ocular myasthenia gravis (HCC)    double vision    Patient Active Problem List   Diagnosis Date Noted   Primary osteoarthritis of right knee 01/13/2023   Status post total hip replacement, left 03/06/2020   Benign prostatic hyperplasia with nocturia 12/29/2019   Status post total knee replacement using cement, left 10/20/2019   Lumbar spondylosis 03/04/2019   Primary osteoarthritis of left hip 03/04/2019   Encounter for screening colonoscopy 10/28/2018   Nocturia more than twice per night 12/30/2017   Hyperlipidemia 05/15/2016   Essential hypertension 05/15/2016   Closed right hip fracture (HCC) 07/05/2015    Past Surgical History:  Procedure Laterality Date   APPENDECTOMY     COLONOSCOPY  01/2009   normal- Dr Lemar Livings   COLONOSCOPY WITH PROPOFOL N/A 11/03/2018   Procedure: COLONOSCOPY WITH PROPOFOL;  Surgeon: Earline Mayotte, MD;  Location: [facility];  Service: Endoscopy;  Laterality: N/A;   EYE SURGERY Bilateral    cataracts   HERNIA REPAIR     TOTAL HIP ARTHROPLASTY Right 07/06/2015   Procedure: TOTAL HIP ARTHROPLASTY;  Surgeon: Christena Flake, MD;  Location: [facility];  Service: Orthopedics;  Laterality: Right;   TOTAL HIP ARTHROPLASTY Left 03/06/2020   Procedure: TOTAL HIP ARTHROPLASTY;  Surgeon: Christena Flake, MD;  Location: [facility];  Service: Orthopedics;  Laterality: Left;   TOTAL KNEE ARTHROPLASTY Left 10/20/2019   Procedure: TOTAL KNEE ARTHROPLASTY;  Surgeon: Christena Flake, MD;  Location: [facility];  Service: Orthopedics;  Laterality: Left;       Home  Medications    Prior to Admission medications   Medication Sig Start Date End Date Taking? Authorizing Provider  acetaminophen (TYLENOL) 500 MG tablet Take 1,000 mg by mouth every 8 (eight) hours as needed for moderate pain.     [provider]  aspirin EC 81 MG tablet Take 81 mg by mouth daily. Swallow whole.    [provider]  atenolol (TENORMIN) 50 MG tablet TAKE (1) TABLET BY MOUTH EVERY DAY 06/29/23   Duanne Limerick, MD  fenofibrate (TRICOR) 145 MG tablet Take 0.5 tablets (72.5 mg total) by mouth at bedtime. 01/02/23   Duanne Limerick, MD  finasteride (PROSCAR) 5 MG tablet Take 5 mg by mouth daily.    [provider]  finasteride (PROSCAR) 5 MG tablet Take 1 tablet (5 mg total) by mouth daily. 07/03/23   Duanne Limerick, MD  folic acid (FOLVITE) 1 MG tablet Take 1 mg by mouth daily.     [provider]  lisinopril (ZESTRIL) 10 MG tablet TAKE (1) TABLET BY MOUTH EVERY DAY 06/29/23   Duanne Limerick, MD  meloxicam (MOBIC) 7.5 MG tablet Take 1 tablet (7.5 mg total) by mouth daily. 07/03/23   Duanne Limerick, MD  methotrexate (RHEUMATREX) 2.5 MG tablet Take 15 mg by mouth every Saturday.     [provider]  Multiple Vitamins-Minerals (CENTRUM SILVER PO) Take 1 tablet by mouth daily.  [provider]  Omega-3 Fatty Acids (FISH OIL) 1000 MG CAPS Take 2,000 mg by mouth in the morning and at bedtime.     [provider]  tamsulosin (FLOMAX) 0.4 MG CAPS capsule Take 1 capsule (0.4 mg total) by mouth daily. 01/02/23   Duanne Limerick, MD  vitamin B-12 (CYANOCOBALAMIN) 1000 MCG tablet Take 1,000 mcg by mouth daily.    [provider]    Family History Family History  Problem Relation Age of Onset   Healthy Father    Diabetes Brother    Hypertension Mother    Colon cancer Mother 67   Diabetes Daughter        type 1   Diabetes Brother    Stroke Brother     Social History Social History   Tobacco Use   Smoking status:  Never   Smokeless tobacco: Never   Tobacco comments:    smoking cessation materials not required  Vaping Use   Vaping status: Never Used  Substance Use Topics   Alcohol use: Not Currently   Drug use: No     Allergies   Patient has no known allergies.   Review of Systems Review of Systems  As noted in HPI Physical Exam Triage Vital Signs ED Triage Vitals  Encounter Vitals Group     BP 08/14/23 1846 117/70     Systolic BP Percentile --      Diastolic BP Percentile --      Pulse Rate 08/14/23 1846 (!) 57     Resp --      Temp 08/14/23 1846 97.6 F (36.4 C)     Temp Source 08/14/23 1846 Oral     SpO2 08/14/23 1846 100 %     Weight 08/14/23 1849 180 lb (81.6 kg)     Height 08/14/23 1849 5\' 11"  (1.803 m)     Head Circumference --      Peak Flow --      Pain Score 08/14/23 1849 0     Pain Loc --      Pain Education --      Exclude from Growth Chart --    No data found.  Updated Vital Signs BP 117/70 (BP Location: Left Arm)   Pulse (!) 57   Temp 97.6 F (36.4 C) (Oral)   Ht 5\' 11"  (1.803 m)   Wt 180 lb (81.6 kg)   SpO2 100%   BMI 25.10 kg/m   Visual Acuity Right Eye Distance:   Left Eye Distance:   Bilateral Distance:    Right Eye Near:   Left Eye Near:    Bilateral Near:     Physical Exam Vitals and nursing note reviewed.  Constitutional:      General: He is not in acute distress.    Appearance: He is normal weight. He is not toxic-appearing.  HENT:     Right Ear: Tympanic membrane, ear canal and external ear normal.     Left Ear: External ear normal.     Ears:     Comments: L ear canal with black FB present which was removed without complications.TM is healthy after removal Eyes:     General: No scleral icterus.    Conjunctiva/sclera: Conjunctivae normal.  Pulmonary:     Effort: Pulmonary effort is normal.  Musculoskeletal:        General: Normal range of motion.     Cervical back: Neck supple.  Skin:    General: Skin is warm and dry.  Neurological:     Mental Status: He is alert and oriented to person, place, and time.     Gait: Gait normal.  Psychiatric:        Mood and Affect: Mood normal.        Behavior: Behavior normal.        Thought Content: Thought content normal.        Judgment: Judgment normal.      UC Treatments / Results  Labs (all labs ordered are listed, but only abnormal results are displayed) Labs Reviewed - No data to display  EKG   Radiology No results found.  Procedures Procedures (including critical care time)  Medications Ordered in UC Medications - No data to display  Initial Impression / Assessment and Plan / UC Course  I have reviewed the triage vital signs and the nursing notes.  FB L ear  FU prn   Final Clinical Impressions(s) / UC Diagnoses   Final diagnoses:  Foreign body of left ear, initial encounter     Discharge Instructions      Follow up with your hearing aid specialist as needed    ED Prescriptions   None    PDMP not reviewed this encounter.   Garey Ham, Cordelia Poche 08/14/23 1909

## 2023-09-01 ENCOUNTER — Other Ambulatory Visit: Payer: Self-pay | Admitting: Family Medicine

## 2023-09-01 DIAGNOSIS — N401 Enlarged prostate with lower urinary tract symptoms: Secondary | ICD-10-CM

## 2023-09-01 DIAGNOSIS — E782 Mixed hyperlipidemia: Secondary | ICD-10-CM

## 2023-09-02 ENCOUNTER — Telehealth: Payer: Self-pay | Admitting: Family Medicine

## 2023-09-02 NOTE — Telephone Encounter (Signed)
Copied from CRM [id number]. Topic: Appointment Scheduling - Scheduling Inquiry for Clinic >> Sep 02, 2023 11:11 AM Marlow Baars wrote: Reason for CRM: The patient called in to make an appt with Dr Ashley Royalty for his right knee. He saw Dr Ashley Royalty a few months back to get it drained and it did help but it is once again bothering him. He is scheduled in the first week of October and on the wait list but he would really like to be seen sooner if possible. Please assist patient further.

## 2023-09-02 NOTE — Telephone Encounter (Signed)
Appointment changed per pt

## 2023-09-15 ENCOUNTER — Encounter: Payer: Self-pay | Admitting: Family Medicine

## 2023-09-15 ENCOUNTER — Ambulatory Visit (INDEPENDENT_AMBULATORY_CARE_PROVIDER_SITE_OTHER): Payer: Medicare PPO | Admitting: Family Medicine

## 2023-09-15 VITALS — BP 136/80 | HR 78 | Ht 71.0 in | Wt 180.0 lb

## 2023-09-15 DIAGNOSIS — M1711 Unilateral primary osteoarthritis, right knee: Secondary | ICD-10-CM | POA: Diagnosis not present

## 2023-09-15 NOTE — Progress Notes (Signed)
     Primary Care / Sports Medicine Office Visit  Patient Information:  Patient ID: Tayton Decaire, male DOB: 1941/11/01 Age: 82 y.o. MRN: [id number]   Esley Brooking is a pleasant 82 y.o. male presenting with the following:  Chief Complaint  Patient presents with   Primary osteoarthritis of right knee    Vitals:   09/15/23 0922  BP: 136/80  Pulse: 78  SpO2: 98%   Vitals:   09/15/23 0922  Weight: 180 lb (81.6 kg)  Height: 5\' 11"  (1.803 m)   Body mass index is 25.1 kg/m.  No results found.   Independent interpretation of notes and tests performed by another provider:   None  Procedures performed:   None  Pertinent History, Exam, Impression, and Recommendations:   Problem List Items Addressed This Visit       Musculoskeletal and Integument   Primary osteoarthritis of right knee - Primary    Presents for follow-up to right knee pain in the setting of osteoarthritis, of note received cortisone injection on 01/13/2023 which provided response until just a few weeks prior.  Denies any trauma or change in activity at that time.  Examination without effusion, full, mildly painful range of motion during maximal flexion, tenderness along the medial joint line, moderate crepitus.  No ligamentous laxity with anterior/posterior or valgus/varus stressing.  Manage most consistent with osteoarthritis related arthralgia, overall reassuring exam.  Plan: - Start meloxicam 7.5 mg daily x 7 days - If persistent pain noted, contact our office to coordinate next steps (such as cortisone injection) - If improved/improving, start home exercises with the information provided and continue meloxicam on a as needed basis - Follow-up as needed        Orders & Medications Medications: No orders of the defined types were placed in this encounter.  No orders of the defined types were placed in this encounter.    No follow-ups on file.     Jerrol Banana, MD, [facility]   Primary  Care Sports Medicine Primary Care and Sports Medicine at [facility]

## 2023-09-15 NOTE — Assessment & Plan Note (Signed)
Presents for follow-up to right knee pain in the setting of osteoarthritis, of note received cortisone injection on 01/13/2023 which provided response until just a few weeks prior.  Denies any trauma or change in activity at that time.  Examination without effusion, full, mildly painful range of motion during maximal flexion, tenderness along the medial joint line, moderate crepitus.  No ligamentous laxity with anterior/posterior or valgus/varus stressing.  Manage most consistent with osteoarthritis related arthralgia, overall reassuring exam.  Plan: - Start meloxicam 7.5 mg daily x 7 days - If persistent pain noted, contact our office to coordinate next steps (such as cortisone injection) - If improved/improving, start home exercises with the information provided and continue meloxicam on a as needed basis - Follow-up as needed

## 2023-09-15 NOTE — Patient Instructions (Signed)
-   Start meloxicam 7.5 mg daily x 7 days - If persistent pain noted, contact our office to coordinate next steps (such as cortisone injection) - If improved/improving, start home exercises with the information provided and continue meloxicam on a as needed basis - Follow-up as needed

## 2023-09-17 ENCOUNTER — Ambulatory Visit: Payer: Medicare PPO | Admitting: Family Medicine

## 2023-09-25 ENCOUNTER — Other Ambulatory Visit: Payer: Self-pay | Admitting: Family Medicine

## 2023-09-25 DIAGNOSIS — I10 Essential (primary) hypertension: Secondary | ICD-10-CM

## 2023-09-25 NOTE — Telephone Encounter (Signed)
Requested Prescriptions  Pending Prescriptions Disp Refills   atenolol (TENORMIN) 50 MG tablet [Pharmacy Med Name: ATENOLOL TAB 50MG ] 90 tablet 0    Sig: TAKE (1) TABLET BY MOUTH EVERY DAY     Cardiovascular: Beta Blockers 2 Passed - 09/25/2023  9:51 AM      Passed - Cr in normal range and within 360 days    Creatinine  Date Value Ref Range Status  05/13/2013 1.03 0.60 - 1.30 mg/dL Final   Creatinine, Ser  Date Value Ref Range Status  07/14/2023 1.13 0.76 - 1.27 mg/dL Final         Passed - Last BP in normal range    BP Readings from Last 1 Encounters:  09/15/23 136/80         Passed - Last Heart Rate in normal range    Pulse Readings from Last 1 Encounters:  09/15/23 78         Passed - Valid encounter within last 6 months    Recent Outpatient Visits           1 week ago Primary osteoarthritis of right knee   [place] [facility], Ocie Bob, MD   2 months ago Essential hypertension   [place] [facility], [facility]   8 months ago Primary osteoarthritis of right knee   [facility] at [facility], Ocie Bob, MD   8 months ago Essential hypertension   [place] [facility], MD   1 year ago Essential hypertension    Primary Care & Sports Medicine at [facility], [facility]       Future Appointments             In 3 months Duanne Limerick, MD [facility] Primary Care & Sports Medicine at [facility], [facility]

## 2023-12-02 ENCOUNTER — Other Ambulatory Visit: Payer: Self-pay | Admitting: Family Medicine

## 2023-12-02 DIAGNOSIS — N401 Enlarged prostate with lower urinary tract symptoms: Secondary | ICD-10-CM

## 2023-12-02 DIAGNOSIS — E782 Mixed hyperlipidemia: Secondary | ICD-10-CM

## 2023-12-02 NOTE — Telephone Encounter (Signed)
Both requested medications filled 09/01/23 for 6 month supplies- too soon Requested Prescriptions  Pending Prescriptions Disp Refills   fenofibrate (TRICOR) 145 MG tablet [Pharmacy Med Name: FENOFIBRATE 145MG  TABLET] 45 tablet 1    Sig: TAKE 0.5 TABLETS BY MOUTH AT BEDTIME     Cardiovascular:  Antilipid - Fibric Acid Derivatives Failed - 12/02/2023  3:42 PM      Failed - HGB in normal range and within 360 days    Hemoglobin  Date Value Ref Range Status  06/28/2020 12.7 (L) 13.0 - 17.7 g/dL Final         Failed - HCT in normal range and within 360 days    Hematocrit  Date Value Ref Range Status  06/28/2020 36.6 (L) 37.5 - 51.0 % Final         Failed - PLT in normal range and within 360 days    Platelets  Date Value Ref Range Status  06/28/2020 163 150 - 450 x10E3/uL Final         Failed - WBC in normal range and within 360 days    WBC  Date Value Ref Range Status  06/28/2020 4.5 3.4 - 10.8 x10E3/uL Final  03/09/2020 6.0 4.0 - 10.5 K/uL Final         Failed - Lipid Panel in normal range within the last 12 months    Cholesterol, Total  Date Value Ref Range Status  07/14/2023 115 100 - 199 mg/dL Final   LDL Chol Calc (NIH)  Date Value Ref Range Status  07/14/2023 65 0 - 99 mg/dL Final   HDL  Date Value Ref Range Status  07/14/2023 36 (L) >39 mg/dL Final   Triglycerides  Date Value Ref Range Status  07/14/2023 64 0 - 149 mg/dL Final         Passed - ALT in normal range and within 360 days    ALT  Date Value Ref Range Status  01/02/2023 15 0 - 44 IU/L Final         Passed - AST in normal range and within 360 days    AST  Date Value Ref Range Status  01/02/2023 18 0 - 40 IU/L Final         Passed - Cr in normal range and within 360 days    Creatinine  Date Value Ref Range Status  05/13/2013 1.03 0.60 - 1.30 mg/dL Final   Creatinine, Ser  Date Value Ref Range Status  07/14/2023 1.13 0.76 - 1.27 mg/dL Final         Passed - eGFR is 30 or above and  within 360 days    EGFR (African American)  Date Value Ref Range Status  05/13/2013 >60  Final   GFR calc Af Amer  Date Value Ref Range Status  01/01/2021 71 >59 mL/min/1.73 Final    Comment:    **In accordance with recommendations from the NKF-ASN Task force,**   [facility] is in the process of updating its eGFR calculation to the   2021 CKD-EPI creatinine equation that estimates kidney function   without a race variable.    EGFR (Non-African Amer.)  Date Value Ref Range Status  05/13/2013 >60  Final    Comment:    eGFR values <87mL/min/1.73 m2 may be an indication of chronic kidney disease (CKD). Calculated eGFR is useful in patients with stable renal function. The eGFR calculation will not be reliable in acutely ill patients when serum creatinine is changing rapidly. It is not useful  in  patients on dialysis. The eGFR calculation may not be applicable to patients at the low and high extremes of body sizes, pregnant women, and vegetarians.    GFR calc non Af Amer  Date Value Ref Range Status  01/01/2021 61 >59 mL/min/1.73 Final   eGFR  Date Value Ref Range Status  07/14/2023 65 >59 mL/min/1.73 Final         Passed - Valid encounter within last 12 months    Recent Outpatient Visits           2 months ago Primary osteoarthritis of right knee   [place] [facility], Ocie Bob, MD   5 months ago Essential hypertension   [place] [facility], [facility]   10 months ago Primary osteoarthritis of right knee   [facility] at [facility], Ocie Bob, MD   11 months ago Essential hypertension   [place] [facility], [facility]   1 year ago Essential hypertension   [place] [facility] at [facility], MD       Future Appointments              In 1 month Duanne Limerick, MD [facility] Primary Care & Sports Medicine at [facility]             tamsulosin (FLOMAX) 0.4 MG CAPS capsule [Pharmacy Med Name: TAMSULOSIN HYDROCHLORIDE 0.4MG  CAPSULE] 90 capsule 1    Sig: TAKE (1) CAPSULE BY MOUTH EVERY DAY     Urology: Alpha-Adrenergic Blocker Passed - 12/02/2023  3:42 PM      Passed - PSA in normal range and within 360 days    Prostate Specific Ag, Serum  Date Value Ref Range Status  01/02/2023 1.9 0.0 - 4.0 ng/mL Final    Comment:    Roche ECLIA methodology. According to the American Urological Association, Serum PSA should decrease and remain at undetectable levels after radical prostatectomy. The AUA defines biochemical recurrence as an initial PSA value 0.2 ng/mL or greater followed by a subsequent confirmatory PSA value 0.2 ng/mL or greater. Values obtained with different assay methods or kits cannot be used interchangeably. Results cannot be interpreted as absolute evidence of the presence or absence of malignant disease.          Passed - Last BP in normal range    BP Readings from Last 1 Encounters:  09/15/23 136/80         Passed - Valid encounter within last 12 months    Recent Outpatient Visits           2 months ago Primary osteoarthritis of right knee   [place] [facility] at [facility], Ocie Bob, MD   5 months ago Essential hypertension   [place] [facility], [facility]   10 months ago Primary osteoarthritis of right knee   [facility] Care & [facility] Medicine at [facility], Ocie Bob, MD   11 months ago Essential hypertension   [place] [facility], MD   1 year ago Essential hypertension   [place] [facility], MD  Future Appointments             In 1 month  Yetta Barre, Vanita Panda, MD [facility] Primary Care & Sports Medicine at [facility], [facility]

## 2023-12-18 ENCOUNTER — Ambulatory Visit
Admission: EM | Admit: 2023-12-18 | Discharge: 2023-12-18 | Disposition: A | Discharge status: Home / Self Care | Payer: Medicare PPO | Attending: Physician Assistant | Admitting: Physician Assistant

## 2023-12-18 DIAGNOSIS — R509 Fever, unspecified: Secondary | ICD-10-CM

## 2023-12-18 DIAGNOSIS — R051 Acute cough: Secondary | ICD-10-CM

## 2023-12-18 DIAGNOSIS — J029 Acute pharyngitis, unspecified: Secondary | ICD-10-CM | POA: Diagnosis not present

## 2023-12-18 DIAGNOSIS — U071 COVID-19: Secondary | ICD-10-CM

## 2023-12-18 DIAGNOSIS — R350 Frequency of micturition: Secondary | ICD-10-CM

## 2023-12-18 LAB — URINALYSIS, W/ REFLEX TO CULTURE (INFECTION SUSPECTED)
Bilirubin Urine: NEGATIVE
Glucose, UA: NEGATIVE mg/dL
Ketones, ur: NEGATIVE mg/dL
Leukocytes,Ua: NEGATIVE
Nitrite: NEGATIVE
Protein, ur: NEGATIVE mg/dL
Specific Gravity, Urine: 1.015 (ref 1.005–1.030)
pH: 7 (ref 5.0–8.0)

## 2023-12-18 LAB — RESP PANEL BY RT-PCR (RSV, FLU A&B, COVID)  RVPGX2
Influenza A by PCR: NEGATIVE
Influenza B by PCR: NEGATIVE
Resp Syncytial Virus by PCR: NEGATIVE
SARS Coronavirus 2 by RT PCR: POSITIVE — AB

## 2023-12-18 LAB — GROUP A STREP BY PCR: Group A Strep by PCR: NOT DETECTED

## 2023-12-18 MED ORDER — NIRMATRELVIR/RITONAVIR (PAXLOVID)TABLET: 3.0000 | ORAL_TABLET | Freq: Two times a day (BID) | ORAL | 0 refills | Status: AC

## 2023-12-18 MED ORDER — ACETAMINOPHEN 325 MG PO TABS
650.0000 mg | ORAL_TABLET | Freq: Once | ORAL | Status: AC
  Administered 2023-12-18: 650 mg via ORAL

## 2023-12-18 MED ORDER — GUAIFENESIN-CODEINE 100-10 MG/5ML PO SOLN: 5.0000 mL | Freq: Four times a day (QID) | ORAL | 0 refills | Status: AC | PRN

## 2023-12-18 NOTE — ED Provider Notes (Signed)
 [facility]    CSN: [id number] Arrival date & time: 12/18/23  1233      History   Chief Complaint Chief Complaint  Patient presents with   Cough   Sore Throat   Urinary Frequency   Generalized Body Aches    HPI Nicholas Monroe is a 83 y.o. male presenting for low-grade fever, fatigue, body aches, chills, sore throat, dry cough x 2 days.  Also reports that he has been getting up more frequently at night to use the restroom but denies any pain with urination, hematuria, abdominal pain, flank pain.  Denies pain in chest or shortness of breath.  No sick contacts.  Attended a get together a couple days before onset of symptoms.  Has had flu vaccine and COVID-vaccine x 2.  Has taken Cheratussin that he was prescribed previously for cough.  Says that the only thing it helped him but he is out of it now and requests more.  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   Benign prostatic hyperplasia with nocturia 12/29/2019   Status post total knee replacement using cement, left 10/20/2019   Lumbar spondylosis 03/04/2019   Primary osteoarthritis of left hip 03/04/2019   Encounter for screening colonoscopy 10/28/2018   Nocturia more than twice per night 12/30/2017   Hyperlipidemia 05/15/2016   Essential hypertension 05/15/2016   Closed right hip fracture (HCC) 07/05/2015    Past Surgical History:  Procedure Laterality Date   APPENDECTOMY     COLONOSCOPY  01/2009   normal- Dr Dessa   COLONOSCOPY WITH PROPOFOL  N/A 11/03/2018   Procedure: COLONOSCOPY WITH PROPOFOL ;  Surgeon: Dessa Reyes ORN, MD;  Location: [facility];  Service: Endoscopy;  Laterality: N/A;   EYE SURGERY Bilateral    cataracts   HERNIA  REPAIR     TOTAL HIP ARTHROPLASTY Right 07/06/2015   Procedure: TOTAL HIP ARTHROPLASTY;  Surgeon: Norleen JINNY Maltos, MD;  Location: [facility];  Service: Orthopedics;  Laterality: Right;   TOTAL HIP ARTHROPLASTY Left 03/06/2020   Procedure: TOTAL HIP ARTHROPLASTY;  Surgeon: Maltos Norleen JINNY, MD;  Location: [facility];  Service: Orthopedics;  Laterality: Left;   TOTAL KNEE ARTHROPLASTY Left 10/20/2019   Procedure: TOTAL KNEE ARTHROPLASTY;  Surgeon: Maltos Norleen JINNY, MD;  Location: [facility];  Service: Orthopedics;  Laterality: Left;       Home Medications    Prior to Admission medications   Medication Sig Start Date End Date Taking? Authorizing Provider  aspirin  EC 81 MG tablet Take 81 mg by mouth daily. Swallow whole.   Yes [provider]  atenolol  (TENORMIN ) 50 MG tablet TAKE (1) TABLET BY MOUTH EVERY DAY 09/25/23  Yes Jones, Deanna C, MD  fenofibrate  (TRICOR ) 145 MG tablet TAKE 0.5 TABLETS BY MOUTH AT BEDTIME 09/01/23  Yes Joshua Cathryne BROCKS, MD  finasteride  (PROSCAR ) 5 MG tablet Take 5 mg by mouth daily.   Yes [provider]  guaiFENesin -codeine  100-10 MG/5ML syrup Take 5 mLs by mouth every 6 (six) hours as needed for cough. 12/18/23  Yes Arvis Huxley B, PA-C  lisinopril  (ZESTRIL ) 10 MG tablet TAKE (1) TABLET BY MOUTH EVERY DAY 06/29/23  Yes Jones, Deanna C, MD  methotrexate (  RHEUMATREX) 2.5 MG tablet Take 15 mg by mouth every Saturday.    Yes [provider]  Multiple Vitamins-Minerals (CENTRUM SILVER  PO) Take 1 tablet by mouth daily.   Yes [provider]  nirmatrelvir /ritonavir  (PAXLOVID ) 20 x 150 MG & 10 x 100MG  TABS Take 3 tablets by mouth 2 (two) times daily for 5 days. Patient GFR is 65. Take nirmatrelvir  (150 mg) two tablets twice daily for 5 days and ritonavir  (100 mg) one tablet twice daily for 5 days. 12/18/23 12/23/23 Yes Arvis Jolan NOVAK, PA-C  Omega-3 Fatty Acids (FISH OIL ) 1000 MG CAPS Take 2,000 mg by mouth in the morning and at bedtime.    Yes [provider]  tamsulosin  (FLOMAX ) 0.4 MG CAPS capsule TAKE (1) CAPSULE BY MOUTH EVERY DAY 09/01/23  Yes Joshua Cathryne BROCKS, MD  vitamin B-12 (CYANOCOBALAMIN ) 1000 MCG tablet Take 1,000 mcg by mouth daily.   Yes [provider]  acetaminophen  (TYLENOL ) 500 MG tablet Take 1,000 mg by mouth every 8 (eight) hours as needed for moderate pain.     [provider]  finasteride  (PROSCAR ) 5 MG tablet Take 1 tablet (5 mg total) by mouth daily. 07/03/23   Joshua Cathryne BROCKS, MD  folic acid  (FOLVITE ) 1 MG tablet Take 1 mg by mouth daily.     [provider]  meloxicam  (MOBIC ) 7.5 MG tablet Take 1 tablet (7.5 mg total) by mouth daily. 07/03/23   Joshua Cathryne BROCKS, MD    Family History Family History  Problem Relation Age of Onset   Healthy Father    Diabetes Brother    Hypertension Mother    Colon cancer Mother 71   Diabetes Daughter        type 1   Diabetes Brother    Stroke Brother     Social History Social History   Tobacco Use   Smoking status: Never   Smokeless tobacco: Never   Tobacco comments:    smoking cessation materials not required  Vaping Use   Vaping status: Never Used  Substance Use Topics   Alcohol use: Not Currently   Drug use: No     Allergies   Patient has no known allergies.   Review of Systems Review of Systems  Constitutional:  Positive for fatigue and fever.  HENT:  Positive for congestion, rhinorrhea and sore throat. Negative for sinus pressure and sinus pain.   Respiratory:  Positive for cough. Negative for shortness of breath and wheezing.   Cardiovascular:  Negative for chest pain.  Gastrointestinal:  Negative for abdominal pain, diarrhea, nausea and vomiting.  Genitourinary:  Positive for frequency. Negative for difficulty urinating, dysuria, flank pain and hematuria.  Musculoskeletal:  Positive for myalgias.  Neurological:  Negative for weakness, light-headedness and headaches.  Hematological:  Negative for adenopathy.      Physical Exam Triage Vital Signs ED Triage Vitals  Encounter Vitals Group     BP      Systolic BP Percentile      Diastolic BP Percentile      Pulse      Resp      Temp      Temp src      SpO2      Weight      Height      Head Circumference      Peak Flow      Pain Score      Pain Loc      Pain Education  Exclude from Growth Chart    No data found.  Updated Vital Signs BP 127/75 (BP Location: Right Arm)   Pulse 70   Temp (!) 100.7 F (38.2 C) (Oral)   Resp (!) 22   SpO2 98%      Physical Exam Vitals and nursing note reviewed.  Constitutional:      General: He is not in acute distress.    Appearance: Normal appearance. He is well-developed. He is not ill-appearing.  HENT:     Head: Normocephalic and atraumatic.     Nose: Congestion present.     Mouth/Throat:     Mouth: Mucous membranes are moist.     Pharynx: Oropharynx is clear. Posterior oropharyngeal erythema present.  Eyes:     General: No scleral icterus.    Conjunctiva/sclera: Conjunctivae normal.  Cardiovascular:     Rate and Rhythm: Normal rate and regular rhythm.     Heart sounds: Normal heart sounds.  Pulmonary:     Effort: Pulmonary effort is normal. No respiratory distress.     Breath sounds: Normal breath sounds.  Abdominal:     Palpations: Abdomen is soft.     Tenderness: There is no abdominal tenderness. There is no right CVA tenderness or left CVA tenderness.  Musculoskeletal:     Cervical back: Neck supple.  Skin:    General: Skin is warm and dry.     Capillary Refill: Capillary refill takes less than 2 seconds.  Neurological:     General: No focal deficit present.     Mental Status: He is alert. Mental status is at baseline.     Motor: No weakness.     Gait: Gait normal.  Psychiatric:        Mood and Affect: Mood normal.        Behavior: Behavior normal.      UC Treatments / Results  Labs (all labs ordered are listed, but only abnormal results are displayed) Labs  Reviewed  RESP PANEL BY RT-PCR (RSV, FLU A&B, COVID)  RVPGX2 - Abnormal; Notable for the following components:      Result Value   SARS Coronavirus 2 by RT PCR POSITIVE (*)    All other components within normal limits  URINALYSIS, W/ REFLEX TO CULTURE (INFECTION SUSPECTED) - Abnormal; Notable for the following components:   APPearance HAZY (*)    Hgb urine dipstick TRACE (*)    Bacteria, UA FEW (*)    All other components within normal limits  GROUP A STREP BY PCR    EKG   Radiology No results found.  Procedures Procedures (including critical care time)  Medications Ordered in UC Medications  acetaminophen  (TYLENOL ) tablet 650 mg (650 mg Oral Given 12/18/23 1311)    Initial Impression / Assessment and Plan / UC Course  I have reviewed the triage vital signs and the nursing notes.  Pertinent labs & imaging results that were available during my care of the patient were reviewed by me and considered in my medical decision making (see chart for details).   83 year old male with history of hypertension, hyperlipidemia, BPH, and arthritis presents for 2-day history of fever, fatigue, bodyaches, cough, congestion, sore throat.  Also reports urinary frequency but denies urinary pain.  Current temp 100.7 degrees.  Patient is overall well-appearing.  No acute distress.  On exam he has nasal congestion and erythema posterior pharynx.  Chest is clear.  Heart regular rate and rhythm.  Abdomen soft and nontender.  Strep and respiratory panel obtained.  Negative strep and flu.  Negative RSV.  Positive COVID.  Reviewed current CDC guidelines, isolation protocol and ED precautions.  Patient would like to take antiviral therapy.  Last GFR was 65 a couple months ago.  Patient was instructed that he will need to discontinue tamsulosin  while taking and for 3 days after.  Discussed that his urinary symptoms could worsen.  We discussed molnupiravir as well but I did explain that it is not as effective as  the Paxlovid  at preventing hospitalization and pneumonia.  He would like to take the Paxlovid  and will hold tamsulosin  as instructed.  Explained that if his urinary symptoms worsen he should contact us  or PCP as he may need to discontinue Paxlovid  and resume the tamsulosin .  Continue finasteride .  Also sent Cheratussin as needed for severe cough especially at night.  Explained that you should be careful taking this medication as it can make him drowsy.  He is aware.  Urinalysis not consistent with UTI.  Acute illness with systemic symptoms.   Final Clinical Impressions(s) / UC Diagnoses   Final diagnoses:  COVID-19  Acute cough  Fever, unspecified  Urinary frequency     Discharge Instructions      -COVID test was positive you should isolate until you are fever free for 24 hours and your symptoms are improving, this may be about 3 more days or so. - Increase rest and fluids. - I sent Paxlovid  antiviral medication to the pharmacy.  This medication is for 5 days.  You will need to discontinue use of your tamsulosin /Flomax  medication while taking the Paxlovid  and wait 3 days before you resume taking it.  If you have increased urinary problems during this time you may need to discontinue the Paxlovid  and restart the tamsulosin .  If this occurs please reach out to your PCP or contact our office. - I sent cough medicine to the pharmacy for you. - Please return if you not breaking the fever in a few days, if increased weakness, worsening cough, pain in chest, shortness of breath or weakness.     ED Prescriptions     Medication Sig Dispense Auth. Provider   nirmatrelvir /ritonavir  (PAXLOVID ) 20 x 150 MG & 10 x 100MG  TABS Take 3 tablets by mouth 2 (two) times daily for 5 days. Patient GFR is 65. Take nirmatrelvir  (150 mg) two tablets twice daily for 5 days and ritonavir  (100 mg) one tablet twice daily for 5 days. 30 tablet Arvis Huxley B, PA-C   guaiFENesin -codeine  100-10 MG/5ML syrup Take 5  mLs by mouth every 6 (six) hours as needed for cough. 120 mL Arvis Huxley NOVAK, PA-C      I have reviewed the PDMP during this encounter.   Arvis Huxley NOVAK, PA-C 12/18/23 1427

## 2023-12-18 NOTE — ED Triage Notes (Signed)
 Sx x 2 days  Body aches Sore  Non productive cough Chills Urinary frequency

## 2023-12-18 NOTE — Discharge Instructions (Addendum)
-  COVID test was positive you should isolate until you are fever free for 24 hours and your symptoms are improving, this may be about 3 more days or so. - Increase rest and fluids. - I sent Paxlovid  antiviral medication to the pharmacy.  This medication is for 5 days.  You will need to discontinue use of your tamsulosin /Flomax  medication while taking the Paxlovid  and wait 3 days before you resume taking it.  If you have increased urinary problems during this time you may need to discontinue the Paxlovid  and restart the tamsulosin .  If this occurs please reach out to your PCP or contact our office. - I sent cough medicine to the pharmacy for you. - Please return if you not breaking the fever in a few days, if increased weakness, worsening cough, pain in chest, shortness of breath or weakness.

## 2023-12-23 DIAGNOSIS — Z125 Encounter for screening for malignant neoplasm of prostate: Secondary | ICD-10-CM | POA: Diagnosis not present

## 2023-12-23 DIAGNOSIS — I10 Essential (primary) hypertension: Secondary | ICD-10-CM | POA: Diagnosis not present

## 2023-12-23 DIAGNOSIS — E785 Hyperlipidemia, unspecified: Secondary | ICD-10-CM | POA: Diagnosis not present

## 2023-12-23 DIAGNOSIS — Z1321 Encounter for screening for nutritional disorder: Secondary | ICD-10-CM | POA: Diagnosis not present

## 2023-12-23 DIAGNOSIS — R7309 Other abnormal glucose: Secondary | ICD-10-CM | POA: Diagnosis not present

## 2023-12-23 DIAGNOSIS — N401 Enlarged prostate with lower urinary tract symptoms: Secondary | ICD-10-CM | POA: Diagnosis not present

## 2023-12-23 DIAGNOSIS — Z79899 Other long term (current) drug therapy: Secondary | ICD-10-CM | POA: Diagnosis not present

## 2023-12-23 DIAGNOSIS — E538 Deficiency of other specified B group vitamins: Secondary | ICD-10-CM | POA: Diagnosis not present

## 2023-12-23 DIAGNOSIS — Z0000 Encounter for general adult medical examination without abnormal findings: Secondary | ICD-10-CM | POA: Diagnosis not present

## 2023-12-25 ENCOUNTER — Telehealth: Payer: Self-pay | Admitting: Family Medicine

## 2023-12-25 NOTE — Telephone Encounter (Signed)
 Copied from CRM [id number]. Topic: General - Other >> Dec 25, 2023 11:15 AM Turkey B wrote: Reason for CRM: pt called in wanted to Speak with Dr Yetta Barre about her good service and speak about possibly having colonoscopy done. Please cb

## 2023-12-28 ENCOUNTER — Telehealth: Payer: Self-pay

## 2023-12-28 NOTE — Telephone Encounter (Signed)
 Spoke to Dr. Yetta Barre about speaking with pt. She agreed she will call and speak with him.

## 2023-12-28 NOTE — Telephone Encounter (Signed)
 Pt was able to speak with Dr Yetta Barre.

## 2024-01-05 ENCOUNTER — Other Ambulatory Visit: Payer: Self-pay | Admitting: Family Medicine

## 2024-01-05 ENCOUNTER — Ambulatory Visit: Payer: Medicare PPO | Admitting: Family Medicine

## 2024-01-05 DIAGNOSIS — I10 Essential (primary) hypertension: Secondary | ICD-10-CM

## 2024-01-05 NOTE — Telephone Encounter (Signed)
Requested Prescriptions  Pending Prescriptions Disp Refills   lisinopril (ZESTRIL) 10 MG tablet [Pharmacy Med Name: LISINOPRIL 10MG  TABLET] 90 tablet 0    Sig: TAKE (1) TABLET BY MOUTH EVERY DAY     Cardiovascular:  ACE Inhibitors Failed - 01/05/2024  2:00 PM      Failed - Valid encounter within last 6 months    Recent Outpatient Visits           3 months ago Primary osteoarthritis of right knee   [facility] Ocie Bob, MD   6 months ago Essential hypertension   [facility] Inches, MD   11 months ago Primary osteoarthritis of right knee   [facility], Ocie Bob, MD   1 year ago Essential hypertension   [facility] Inches, MD   1 year ago Essential hypertension   [facility], MD              Passed - Cr in normal range and within 180 days    Creatinine  Date Value Ref Range Status  05/13/2013 1.03 0.60 - 1.30 mg/dL Final   Creatinine, Ser  Date Value Ref Range Status  07/14/2023 1.13 0.76 - 1.27 mg/dL Final         Passed - K in normal range and within 180 days    Potassium  Date Value Ref Range Status  07/14/2023 4.8 3.5 - 5.2 mmol/L Final         Passed - Patient is not pregnant      Passed - Last BP in normal range    BP Readings from Last 1 Encounters:  12/18/23 127/75

## 2024-03-08 ENCOUNTER — Other Ambulatory Visit: Payer: Self-pay | Admitting: Family Medicine

## 2024-03-08 DIAGNOSIS — E782 Mixed hyperlipidemia: Secondary | ICD-10-CM

## 2024-03-08 DIAGNOSIS — N401 Enlarged prostate with lower urinary tract symptoms: Secondary | ICD-10-CM

## 2024-03-15 ENCOUNTER — Other Ambulatory Visit: Payer: Self-pay | Admitting: Family Medicine

## 2024-03-15 DIAGNOSIS — E782 Mixed hyperlipidemia: Secondary | ICD-10-CM

## 2024-03-28 ENCOUNTER — Other Ambulatory Visit: Payer: Self-pay | Admitting: Family Medicine

## 2024-03-28 DIAGNOSIS — I10 Essential (primary) hypertension: Secondary | ICD-10-CM

## 2024-03-30 ENCOUNTER — Other Ambulatory Visit: Payer: Self-pay | Admitting: Family Medicine

## 2024-03-30 DIAGNOSIS — I10 Essential (primary) hypertension: Secondary | ICD-10-CM

## 2024-05-31 ENCOUNTER — Emergency Department
Admission: EM | Admit: 2024-05-31 | Discharge: 2024-05-31 | Disposition: A | Discharge status: Home / Self Care | Attending: Emergency Medicine | Admitting: Emergency Medicine

## 2024-05-31 DIAGNOSIS — I83893 Varicose veins of bilateral lower extremities with other complications: Secondary | ICD-10-CM | POA: Diagnosis present

## 2024-05-31 DIAGNOSIS — I10 Essential (primary) hypertension: Secondary | ICD-10-CM | POA: Insufficient documentation

## 2024-05-31 DIAGNOSIS — I83899 Varicose veins of unspecified lower extremities with other complications: Secondary | ICD-10-CM

## 2024-05-31 LAB — BASIC METABOLIC PANEL WITH GFR
Anion gap: 7 (ref 5–15)
BUN: 21 mg/dL (ref 8–23)
CO2: 23 mmol/L (ref 22–32)
Calcium: 8.2 mg/dL — ABNORMAL LOW (ref 8.9–10.3)
Chloride: 109 mmol/L (ref 98–111)
Creatinine, Ser: 1.05 mg/dL (ref 0.61–1.24)
GFR, Estimated: >60 mL/min (ref >60)
Glucose, Bld: 138 mg/dL — ABNORMAL HIGH (ref 70–99)
Potassium: 3.5 mmol/L (ref 3.5–5.1)
Sodium: 139 mmol/L (ref 135–145)

## 2024-05-31 LAB — SAMPLE TO BLOOD BANK

## 2024-05-31 LAB — CBC
HCT: 30.6 % — ABNORMAL LOW (ref 39.0–52.0)
Hemoglobin: 10.3 g/dL — ABNORMAL LOW (ref 13.0–17.0)
MCH: 33.1 pg (ref 26.0–34.0)
MCHC: 33.7 g/dL (ref 30.0–36.0)
MCV: 98.4 fL (ref 80.0–100.0)
Platelets: 126 10*3/uL — ABNORMAL LOW (ref 150–400)
RBC: 3.11 MIL/uL — ABNORMAL LOW (ref 4.22–5.81)
RDW: 13.4 % (ref 11.5–15.5)
WBC: 3.8 10*3/uL — ABNORMAL LOW (ref 4.0–10.5)
nRBC: 0 % (ref 0.0–0.2)

## 2024-05-31 MED ORDER — SILVER NITRATE-POT NITRATE 75-25 % EX MISC
3.0000 | Freq: Once | CUTANEOUS | Status: AC
  Filled 2024-05-31: qty 3

## 2024-05-31 MED ORDER — SILVER NITRATE-POT NITRATE 75-25 % EX MISC
CUTANEOUS | Status: AC
  Administered 2024-05-31: 3 via TOPICAL
  Filled 2024-05-31: qty 10

## 2024-05-31 NOTE — ED Notes (Signed)
 Pt verbalizes understanding of discharge instructions. Opportunity for questioning and answers were provided. Pt discharged from ED to home with family.

## 2024-05-31 NOTE — ED Triage Notes (Signed)
 Pt presents to the ED via [facility] from home for varicose vein bleed followed by orthostatic hypotension. Pt's bleeding controlled at time of triage. Pt did have a near syncope episode prior to departure with EMS. No use of blood thinner.  bolus NaCl Initial BP 138/70 Standing BP 96/60

## 2024-05-31 NOTE — Discharge Instructions (Addendum)
 Please keep the area dry for the next 2 days.  Avoid any significant exertion for the next 2 days.  If any bleeding recurs please lay down and elevate your leg in the air and apply pressure to the area of the bleed.  If the bleed does not stop at home please return immediately to the emergency department for further evaluation.

## 2024-05-31 NOTE — ED Provider Notes (Signed)
 [facility] Provider Note    Event Date/Time   First MD Initiated Contact with Patient 05/31/24 [phone]     (approximate)  History   Chief Complaint: Hypotension  HPI  Nicholas Monroe is a 83 y.o. male with a past medical history of arthritis, hypertension, presents to the emergency department for lower extremity bleed.  According to the patient he got up this morning to use the restroom and noticed blood running down his leg.  Patient states the blood was spraying out of a pinhole size leak in his leg.  Patient states this is never happened previously.  EMS states patient became lightheaded.  Received 500 cc of normal saline prior to arrival and the patient is feeling much better.  Denies any symptoms currently.  Physical Exam   Triage Vital Signs: ED Triage Vitals [05/31/24 0809]  Encounter Vitals Group     BP 126/73     Girls Systolic BP Percentile      Girls Diastolic BP Percentile      Boys Systolic BP Percentile      Boys Diastolic BP Percentile      Pulse Rate (!) 54     Resp 18     Temp 98.6 F (37 C)     Temp src      SpO2 100 %     Weight 180 lb (81.6 kg)     Height 5' 11 (1.803 m)     Head Circumference      Peak Flow      Pain Score 0     Pain Loc      Pain Education      Exclude from Growth Chart     Most recent vital signs: Vitals:   05/31/24 0809  BP: 126/73  Pulse: (!) 54  Resp: 18  Temp: 98.6 F (37 C)  SpO2: 100%    General: Awake, no distress.  CV:  Good peripheral perfusion.  Regular rate and rhythm  Resp:  Normal effort.  Equal breath sounds bilaterally.  Abd:  No distention.  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   ED Results / Procedures / Treatments   MEDICATIONS ORDERED IN ED: Medications  silver nitrate applicators 75-25 % applicator (has no administration in time range)  silver nitrate  applicators applicator 3 Stick (has no administration in time range)   EKG viewed and interpreted by myself shows a normal sinus rhythm at 50 bpm with a narrow QRS, normal axis, slight PR prolongation otherwise normal intervals with no concerning ST changes.  IMPRESSION / MDM / ASSESSMENT AND PLAN / ED COURSE  I reviewed the triage vital signs and the nursing notes.  Patient's presentation is most consistent with acute presentation with potential threat to life or bodily function.  Patient presents to the emergency department for lower extremity hemorrhage likely due to a bleeding varicose vein.  EMS states fairly significant blood loss at the scene, patient became lightheaded with lower blood pressure was given 500 cc normal saline.  On arrival to the emergency department patient denies any symptoms he is awake alert oriented and well-appearing.  There is a small area of scabbing to the lower extremity where there is surrounding dried blood likely the source of the bleed.  We will clean, apply silver nitrate, cover with Dermabond.  Patient agreeable to plan.  I was able to clean the area  identified an area of bleeding that was mildly bleeding to the leg consistent with a bleeding varicose vein.  I was able to place the patient in Trendelenburg which greatly slowed the bleeding.  Applied pressure followed by 2 silver nitrate stick applications and then covered with Dermabond after achieving hemostasis.  Patient's lab work is overall reassuring hemoglobin is 10.3 which is somewhat down from his most recent level however largely unchanged from his chronic level.  Chemistry is reassuring.  Vital signs are reassuring and the patient states he is feeling much better with no symptoms currently.  We will monitor to ensure continued hemostasis as long as the bleeding does not recur believe the patient would be safe for discharge home with outpatient follow-up.  Discussed with the patient taking it easy over the  next few days keeping the leg elevated when he is able and returning for any bleeding.  FINAL CLINICAL IMPRESSION(S) / ED DIAGNOSES   Bleeding varicose vein   Note:  This document was prepared using Dragon voice recognition software and may include unintentional dictation errors.   Ruth Cove, MD 05/31/24 [phone]

## 2024-06-03 ENCOUNTER — Encounter: Payer: Self-pay | Admitting: Emergency Medicine

## 2024-06-03 ENCOUNTER — Ambulatory Visit: Admission: EM | Admit: 2024-06-03 | Discharge: 2024-06-03 | Disposition: A | Discharge status: Home / Self Care

## 2024-06-03 DIAGNOSIS — I83892 Varicose veins of left lower extremities with other complications: Secondary | ICD-10-CM

## 2024-06-03 NOTE — ED Provider Notes (Signed)
 [facility]    CSN: [id number] Arrival date & time: 06/03/24  1737      History   Chief Complaint Chief Complaint  Patient presents with   Leg Injury    APPOINTMENT Left lower    HPI Nicholas Monroe is a 83 y.o. male presenting with his wife for evaluation of bleeding from a varicose vein of the left ankle.  He was seen in the emergency department 3 days ago and had the area cauterized with silver nitrate and they also applied Dermabond.  He reports getting into the shower today and noted that he started to have some rebleeding from the area, although not nearly as severe as when he went to the ER.  He stopped taking the baby aspirin  2 days ago.  He denies any pain.  Denies injury.  Has not been rubbing or scrubbing the area.  He says he has been trying to be careful.  He was told he could get the area wet after 2 days.  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   Benign prostatic hyperplasia with nocturia 12/29/2019   Status post total knee replacement using cement, left 10/20/2019   Lumbar spondylosis 03/04/2019   Primary osteoarthritis of left hip 03/04/2019   Encounter for screening colonoscopy 10/28/2018   Nocturia more than twice per night 12/30/2017   Hyperlipidemia 05/15/2016   Essential hypertension 05/15/2016   Closed right hip fracture (HCC) 07/05/2015    Past Surgical History:  Procedure Laterality Date   APPENDECTOMY     COLONOSCOPY  01/2009   normal- Dr Marquita Situ   COLONOSCOPY WITH PROPOFOL  N/A 11/03/2018   Procedure: COLONOSCOPY WITH PROPOFOL ;  Surgeon: Marshall Skeeter, MD;  Location: [facility];  Service: Endoscopy;  Laterality: N/A;   EYE SURGERY Bilateral    cataracts    HERNIA REPAIR     TOTAL HIP ARTHROPLASTY Right 07/06/2015   Procedure: TOTAL HIP ARTHROPLASTY;  Surgeon: Elner Hahn, MD;  Location: [facility];  Service: Orthopedics;  Laterality: Right;   TOTAL HIP ARTHROPLASTY Left 03/06/2020   Procedure: TOTAL HIP ARTHROPLASTY;  Surgeon: Elner Hahn, MD;  Location: [facility];  Service: Orthopedics;  Laterality: Left;   TOTAL KNEE ARTHROPLASTY Left 10/20/2019   Procedure: TOTAL KNEE ARTHROPLASTY;  Surgeon: Elner Hahn, MD;  Location: [facility];  Service: Orthopedics;  Laterality: Left;       Home Medications    Prior to Admission medications   Medication Sig Start Date End Date Taking? Authorizing Provider  acetaminophen  (TYLENOL ) 500 MG tablet Take 1,000 mg by mouth every 8 (eight) hours as needed for moderate pain.     [provider]  aspirin  EC 81 MG tablet Take 81 mg by mouth daily. Swallow whole.    [provider]  atenolol  (TENORMIN ) 50 MG tablet TAKE (1) TABLET BY MOUTH EVERY DAY 09/25/23   Clarise Crooks, MD  fenofibrate  (TRICOR ) 145 MG tablet TAKE 0.5 TABLETS BY MOUTH AT BEDTIME 09/01/23   Jones, Deanna C, MD  finasteride  (PROSCAR ) 5 MG tablet Take 5 mg by mouth daily.    [provider]  finasteride  (PROSCAR ) 5 MG tablet Take 1 tablet (5 mg  total) by mouth daily. 07/03/23   Clarise Crooks, MD  folic acid  (FOLVITE ) 1 MG tablet Take 1 mg by mouth daily.     [provider]  guaiFENesin -codeine  100-10 MG/5ML syrup Take 5 mLs by mouth every 6 (six) hours as needed for cough. 12/18/23   Nancy Axon B, PA-C  lisinopril  (ZESTRIL ) 10 MG tablet TAKE (1) TABLET BY MOUTH EVERY DAY 01/05/24   Clarise Crooks, MD  meloxicam  (MOBIC ) 7.5 MG tablet Take 1 tablet (7.5 mg total) by mouth daily. 07/03/23   Jones, Deanna C, MD  methotrexate (RHEUMATREX) 2.5 MG tablet Take 15 mg by mouth every Saturday.     [provider]  Multiple Vitamins-Minerals (CENTRUM SILVER PO) Take 1 tablet by mouth daily.    [provider]  Omega-3 Fatty Acids (FISH OIL ) 1000 MG CAPS Take 2,000 mg by mouth in the morning and at bedtime.     [provider]  tamsulosin  (FLOMAX ) 0.4 MG CAPS capsule TAKE (1) CAPSULE BY MOUTH EVERY DAY 09/01/23   Jones, Deanna C, MD  vitamin B-12 (CYANOCOBALAMIN ) 1000 MCG tablet Take 1,000 mcg by mouth daily.    [provider]    Family History Family History  Problem Relation Age of Onset   Healthy Father    Diabetes Brother    Hypertension Mother    Colon cancer Mother 87   Diabetes Daughter        type 1   Diabetes Brother    Stroke Brother     Social History Social History   Tobacco Use   Smoking status: Never   Smokeless tobacco: Never   Tobacco comments:    smoking cessation materials not required  Vaping Use   Vaping status: Never Used  Substance Use Topics   Alcohol use: Not Currently   Drug use: No     Allergies   Patient has no known allergies.   Review of Systems Review of Systems  Musculoskeletal:  Negative for arthralgias, gait problem and joint swelling.  Skin:  Positive for wound. Negative for color change.  Neurological:  Negative for weakness.     Physical Exam Triage Vital Signs ED Triage Vitals  Encounter Vitals Group     BP 06/03/24 1801 (!) 149/78     Girls Systolic BP Percentile --      Girls Diastolic BP Percentile --      Boys Systolic BP Percentile --      Boys Diastolic BP Percentile --      Pulse Rate 06/03/24 1801 61     Resp 06/03/24 1801 15     Temp 06/03/24 1801 98.1 F (36.7 C)     Temp Source 06/03/24 1801 Oral     SpO2 06/03/24 1801 96 %     Weight 06/03/24 1800 179 lb 14.3 oz (81.6 kg)     Height 06/03/24 1800 5' 11 (1.803 m)     Head Circumference --      Peak Flow --      Pain Score 06/03/24 1800 0     Pain Loc --      Pain Education --      Exclude from Growth Chart --    No data found.  Updated Vital Signs BP (!) 149/78 (BP Location: Right Arm)   Pulse 61   Temp 98.1 F (36.7  C) (Oral)   Resp 15   Ht 5' 11 (1.803 m)   Wt 179 lb 14.3 oz (81.6 kg)  SpO2 96%   BMI 25.09 kg/m    Physical Exam Vitals and nursing note reviewed.  Constitutional:      General: He is not in acute distress.    Appearance: Normal appearance. He is well-developed. He is not ill-appearing.  HENT:     Head: Normocephalic and atraumatic.   Eyes:     General: No scleral icterus.    Conjunctiva/sclera: Conjunctivae normal.    Cardiovascular:     Rate and Rhythm: Normal rate.  Pulmonary:     Effort: Pulmonary effort is normal. No respiratory distress.   Musculoskeletal:     Cervical back: Neck supple.   Skin:    General: Skin is warm and dry.     Capillary Refill: Capillary refill takes less than 2 seconds.     Comments: Dried blood of the left ankle over varicose vein. No active bleeding. No tenderness.   Neurological:     General: No focal deficit present.     Mental Status: He is alert. Mental status is at baseline.     Motor: No weakness.     Gait: Gait normal.   Psychiatric:        Mood and Affect: Mood normal.        Behavior: Behavior normal.      UC Treatments / Results  Labs (all labs ordered are listed, but only abnormal results are displayed) Labs Reviewed - No data to display  EKG   Radiology No results found.  Procedures Procedures (including critical care time)  Medications Ordered in UC Medications - No data to display  Initial Impression / Assessment and Plan / UC Course  I have reviewed the triage vital signs and the nursing notes.  Pertinent labs & imaging results that were available during my care of the patient were reviewed by me and considered in my medical decision making (see chart for details).   83 year old male dents for bleeding from the left ankle today.  He was seen in the ER 3 days ago for bleeding varicose veins and had the area cauterized with silver nitrate and Dermabond was applied. ED visit reviewed.  Area was  cleansed with wound cleanser.  Applied more Dermabond.  After Dermabond dried applied nonstick bandage and Tegaderm.  Explained to patient and wife he should keep the area covered when he showers so he does not get it wet and cause the Dermabond to come off. Advised this over the next 5 days or so.  Overall supportive care reviewed with patient and his wife.  Return and ED precautions discussed.   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 - Be careful not to get this wet for the next 5 days or so as it can ruin the integrity of the glue and come off sooner than intended which makes it prone to rebleeding. -You should bandage the area like I showed you in the urgent care this evening when you shower - If you notice increased swelling, increased redness, increased pain please return for evaluation as you could have an infection.    ED Prescriptions   None    PDMP not reviewed this encounter.   Floydene Hy, PA-C 06/03/24 2017

## 2024-06-03 NOTE — ED Triage Notes (Signed)
 Patient states that he started having bleeding from a vein in his left lower leg on Tuesday.  Patient states that he was seen and treated for the bleeding.  Patient states that when he showered today, it started bleeding.  Patient has dressing to his left lower leg. Patient stopped taking his baby ASA.

## 2024-06-03 NOTE — Discharge Instructions (Addendum)
-   Clean the area again and applied topical Dermabond or skin adhesive. - Be careful not to get this wet for the next 5 days or so as it can ruin the integrity of the glue and come off sooner than intended which makes it prone to rebleeding. -You should bandage the area like I showed you in the urgent care this evening when you shower - If you notice increased swelling, increased redness, increased pain please return for evaluation as you could have an infection.

## 2024-07-10 ENCOUNTER — Emergency Department
Admission: EM | Admit: 2024-07-10 | Discharge: 2024-07-11 | Disposition: A | Discharge status: Home / Self Care | Attending: Emergency Medicine | Admitting: Emergency Medicine

## 2024-07-10 ENCOUNTER — Other Ambulatory Visit: Payer: Self-pay

## 2024-07-10 DIAGNOSIS — I83892 Varicose veins of left lower extremities with other complications: Secondary | ICD-10-CM | POA: Insufficient documentation

## 2024-07-10 NOTE — ED Triage Notes (Signed)
 Pt presents via POV c/o bleeding to left ankle. Reports had previous episode of same d/t varicose vein. Denied injury, reports bleeding started after taking sock off.   Bleeding controlled at this time. Bandage in place.

## 2024-07-10 NOTE — ED Triage Notes (Signed)
 FIRST NURSE NOTE:  Pt arrived via [facility] from home, reports ripping open varicose vein when taking his socks off. Here last month for similar   VSS with EMS.

## 2024-07-11 MED ORDER — SILVER NITRATE-POT NITRATE 75-25 % EX MISC
1.0000 | Freq: Once | CUTANEOUS | Status: AC
  Administered 2024-07-11: 1 via TOPICAL
  Filled 2024-07-11: qty 10

## 2024-07-11 NOTE — Discharge Instructions (Addendum)
 As we discussed, we recommend you try to leave the area on your left ankle alone for as long as you can while it heals.  We did not place any sutures, but used a variety of different techniques to try and keep it from bleeding again.  The greatest risk is when the scab comes off, but if it does start bleeding again, please try to apply direct pressure to the wound and try raising your ankle up into the air a bit, such as resting it on some pillows or cushions.  If it continues to bleed, please follow-up at the nearest emergency department or urgent care or at your doctor's office

## 2024-07-11 NOTE — ED Provider Notes (Signed)
 [facility] Provider Note    Event Date/Time   First MD Initiated Contact with Patient 07/10/24 2356     (approximate)   History   Laceration   HPI Nicholas Monroe is a 83 y.o. male who presents for evaluation of bleeding from a vein on his left ankle.  He has had bleeding from that site in similar sites in the past.  He stopped his baby aspirin  a couple of weeks ago and said he did not think he was messing with the scabs or veins on his ankle, but all of a sudden tonight he and his wife noticed that he was bleeding in the bed.  He said that a steady stream of blood was shooting about 3 feet from one of the varicose veins on his ankle.  This is similar to what happened last time which was about a month ago.  They panicked and called EMS but applied a pressure dressing and the bleeding was stopped and under control.  He has no pain.  No other systemic symptoms and no other injuries or recent falls.     Physical Exam   Triage Vital Signs: ED Triage Vitals  Encounter Vitals Group     BP 07/10/24 2301 120/81     Girls Systolic BP Percentile --      Girls Diastolic BP Percentile --      Boys Systolic BP Percentile --      Boys Diastolic BP Percentile --      Pulse Rate 07/10/24 2301 61     Resp 07/10/24 2301 16     Temp 07/10/24 2301 98.6 F (37 C)     Temp Source 07/10/24 2301 Oral     SpO2 07/10/24 2301 100 %     Weight --      Height --      Head Circumference --      Peak Flow --      Pain Score 07/10/24 2302 0     Pain Loc --      Pain Education --      Exclude from Growth Chart --     Most recent vital signs: Vitals:   07/10/24 2301  BP: 120/81  Pulse: 61  Resp: 16  Temp: 98.6 F (37 C)  SpO2: 100%    General: Awake, no distress.  CV:  Good peripheral perfusion.  Resp:  Normal effort. Speaking easily and comfortably, no accessory muscle usage nor intercostal retractions.   Abd:  No distention.  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  There are a couple of older scars and 1 scab that may have been the wound from last month.  I uncovered the dressing and initially there was no bleeding but then blood started to ooze from one of the wounds although it is no longer spurting a steady stream of blood as previously described.   ED Results / Procedures / Treatments   Labs (all labs ordered are listed, but only abnormal results are displayed) Labs Reviewed - No data to display     PROCEDURES:  Critical Care performed: No  .Laceration Repair  Date/Time: 07/11/2024 12:37 AM  Performed by: Gordan Huxley, MD Authorized by: Gordan Huxley, MD   Consent:    Consent obtained:  Verbal   Consent given by:  Patient   Risks discussed:  Need for additional repair Universal protocol:    Patient  identity confirmed:  Verbally with patient Laceration details:    Location:  Foot   Foot location:  L ankle   Length (cm):  0.3 Exploration:    Hemostasis achieved with:  Direct pressure and cautery (silver  nitrate stick) Skin repair:    Repair method:  Steri-Strips and tissue adhesive Repair type:    Repair type:  Simple Post-procedure details:    Dressing:  Open (no dressing)   Procedure completion:  Tolerated well, no immediate complications     IMPRESSION / MDM / ASSESSMENT AND PLAN / ED COURSE  I reviewed the triage vital signs and the nursing notes.                              Differential diagnosis includes, but is not limited to, bleeding varicose vein, arterial bleed, infection.  Patient's presentation is most consistent with acute presentation with potential threat to life or bodily function.   Interventions/Medications given:  Medications  silver  nitrate applicators applicator 1 Stick (1 Stick Topical Given 07/11/24 0021)   (Note:  hospital course my include additional interventions and/or labs/studies not listed  above.)   Patient takes no blood thinners and recently stopped his baby aspirin .  No other complaints or concerns and no systemic signs or symptoms.  No evidence of infection.  This is a recurrent issue for him.  I cauterized the wound with silver  nitrate stick and reinforce it with Dermabond and Steri-Strips.  I had my usual and customary management discussion with the patient and family and provided follow-up information with vascular surgery as per their request to discuss the ongoing issue.  The patient's medical screening exam is reassuring with no indication of an emergent medical condition requiring hospitalization or additional evaluation at this point.  The patient is safe and appropriate for discharge and outpatient follow up.       FINAL CLINICAL IMPRESSION(S) / ED DIAGNOSES   Final diagnoses:  Bleeding from varicose veins of lower extremity, left     Rx / DC Orders   ED Discharge Orders     None        Note:  This document was prepared using Dragon voice recognition software and may include unintentional dictation errors.   Gordan Huxley, MD 07/11/24 [id number]

## 2024-07-28 DIAGNOSIS — I872 Venous insufficiency (chronic) (peripheral): Secondary | ICD-10-CM | POA: Insufficient documentation

## 2024-07-28 NOTE — Progress Notes (Unsigned)
 MRN : [id number]  Nicholas Monroe is a 83 y.o. ([birth year]) male who presents with chief complaint of legs hurt and swell.  History of Present Illness:   The patient is seen for evaluation of symptomatic varicose veins.  He has now had 3 episodes of hemorrhage from the varicose veins of the left lower extremity.  Twice he has had to call the squad and twice he has been taken to the emergency room.  He notes that prior to these episodes of hemorrhage the veins were very painful.  The patient relates burning and stinging which worsened steadily throughout the course of the day, particularly with standing. The patient also notes an aching and throbbing pain over the varicosities, particularly with prolonged dependent positions. The symptoms are significantly improved with elevation.  The patient also notes that during hot weather the symptoms are greatly intensified. The patient states the pain from the varicose veins interferes with work, daily exercise, shopping and household maintenance. At this point, the symptoms are persistent and severe enough that they're having a negative impact on lifestyle and are interfering with daily activities.  He notes that he is worn compression for many many years he has been exercising on a routine basis and he does elevate his legs when he is at home.  None of these conservative measures have prevented him from hemorrhaging.  There is no history of DVT, PE or superficial thrombophlebitis. There is no history of ulceration. The patient denies a significant family history of varicose veins.  The patient has not worn graduated compression in the past. At the present time the patient has not been using over-the-counter analgesics. There is no history of prior surgical intervention or sclerotherapy.  Duplex ultrasound of the venous system left lower extremity obtained today demonstrates extensive reflux throughout the great saphenous vein.  Deep venous  system is normal.  No outpatient medications have been marked as taking for the 08/01/24 encounter (Appointment) with Jama, Cordella MATSU, MD.    Past Medical History:  Diagnosis Date   Arthritis    Hyperlipidemia    Hypertension    Ocular myasthenia gravis (HCC)    double vision    Past Surgical History:  Procedure Laterality Date   APPENDECTOMY     COLONOSCOPY  01/2009   normal- Dr Dessa   COLONOSCOPY WITH PROPOFOL N/A 11/03/2018   Procedure: COLONOSCOPY WITH PROPOFOL;  Surgeon: Dessa Reyes ORN, MD;  Location: [facility];  Service: Endoscopy;  Laterality: N/A;   EYE SURGERY Bilateral    cataracts   HERNIA REPAIR     TOTAL HIP ARTHROPLASTY Right 07/06/2015   Procedure: TOTAL HIP ARTHROPLASTY;  Surgeon: Norleen JINNY Maltos, MD;  Location: [facility];  Service: Orthopedics;  Laterality: Right;   TOTAL HIP ARTHROPLASTY Left 03/06/2020   Procedure: TOTAL HIP ARTHROPLASTY;  Surgeon: Maltos Norleen JINNY, MD;  Location: [facility];  Service: Orthopedics;  Laterality: Left;   TOTAL KNEE ARTHROPLASTY Left 10/20/2019   Procedure: TOTAL KNEE ARTHROPLASTY;  Surgeon: Maltos Norleen JINNY, MD;  Location: [facility];  Service: Orthopedics;  Laterality: Left;    Social History Social History   Tobacco Use   Smoking status: Never   Smokeless tobacco: Never   Tobacco comments:    smoking cessation materials not required  Vaping Use   Vaping status: Never Used  Substance Use Topics   Alcohol use: Not Currently   Drug use: No    Family History Family History  Problem  Relation Age of Onset   Healthy Father    Diabetes Brother    Hypertension Mother    Colon cancer Mother 71   Diabetes Daughter        type 1   Diabetes Brother    Stroke Brother     No Known Allergies   REVIEW OF SYSTEMS (Negative unless checked)  Constitutional: [] Weight loss  [] Fever  [] Chills Cardiac: [] Chest pain   [] Chest pressure   [] Palpitations   [] Shortness of breath when laying flat   [] Shortness of breath with  exertion. Vascular:  [] Pain in legs with walking   [x] Pain in legs at rest  [] History of DVT   [] Phlebitis   [x] Swelling in legs   [] Varicose veins   [] Non-healing ulcers Pulmonary:   [] Uses home oxygen   [] Productive cough   [] Hemoptysis   [] Wheeze  [] COPD   [] Asthma Neurologic:  [] Dizziness   [] Seizures   [] History of stroke   [] History of TIA  [] Aphasia   [] Vissual changes   [] Weakness or numbness in arm   [] Weakness or numbness in leg Musculoskeletal:   [] Joint swelling   [x] Joint pain   [x] Low back pain Hematologic:  [] Easy bruising  [] Easy bleeding   [] Hypercoagulable state   [] Anemic Gastrointestinal:  [] Diarrhea   [] Vomiting  [] Gastroesophageal reflux/heartburn   [] Difficulty swallowing. Genitourinary:  [] Chronic kidney disease   [] Difficult urination  [] Frequent urination   [] Blood in urine Skin:  [] Rashes   [] Ulcers  Psychological:  [] History of anxiety   []  History of major depression.  Physical Examination  There were no vitals filed for this visit. There is no height or weight on file to calculate BMI. Gen: WD/WN, NAD Head: [place]/AT, No temporalis wasting.  Ear/Nose/Throat: Hearing grossly intact, nares w/o erythema or drainage, pinna without lesions Eyes: PER, EOMI, sclera nonicteric.  Neck: Supple, no gross masses.  No JVD.  Pulmonary:  Good air movement, no audible wheezing, no use of accessory muscles.  Cardiac: RRR, precordium not hyperdynamic. Vascular: Extensive large varicosities present left lower extremity greater than 10 mm.  2 small ulcerated areas from where he hemorrhaged present in the ankle.  Moderate venous stasis changes to the legs bilaterally.  1+ soft pitting edema. CEAP C4sEpAsPr   Vessel Right Left  Radial Palpable Palpable  Gastrointestinal: soft, non-distended. No guarding/no peritoneal signs.  Musculoskeletal: M/S 5/5 throughout.  No deformity.  Neurologic: CN 2-12 intact. Pain and light touch intact in extremities.  Symmetrical.  Speech is fluent. Motor  exam as listed above. Psychiatric: Judgment intact, Mood & affect appropriate for pt's clinical situation. Dermatologic: Venous rashes with 2 ulcers noted.  No changes consistent with cellulitis. Lymph : No lichenification or skin changes of chronic lymphedema.  CBC Lab Results  Component Value Date   WBC 3.8 (L) 05/31/2024   HGB 10.3 (L) 05/31/2024   HCT 30.6 (L) 05/31/2024   MCV 98.4 05/31/2024   PLT 126 (L) 05/31/2024    BMET    Component Value Date/Time   NA 139 05/31/2024 0813   NA 140 07/14/2023 0849   K 3.5 05/31/2024 0813   CL 109 05/31/2024 0813   CO2 23 05/31/2024 0813   GLUCOSE 138 (H) 05/31/2024 0813   BUN 21 05/31/2024 0813   BUN 18 07/14/2023 0849   CREATININE 1.05 05/31/2024 0813   CREATININE 1.03 05/13/2013 1135   CALCIUM 8.2 (L) 05/31/2024 0813   GFRNONAA >60 05/31/2024 0813   GFRNONAA >60 05/13/2013 1135   GFRAA 71 01/01/2021 1159  GFRAA >60 05/13/2013 1135   CrCl cannot be calculated (Patient's most recent lab result is older than the maximum 21 days allowed.).  COAG No results found for: INR, PROTIME  Radiology No results found.   Assessment/Plan 1. 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 Patient will continue  wearing graduated compression stockings class 1 on a daily basis, beginning first thing in the morning and removing them in the evening.  The patient is CEAP C3sEpAsPr.  The patient has been wearing compression for more than 12 weeks with no or little benefit.  The patient has been exercising daily for more than 12 weeks. The patient has been elevating and taking OTC pain medications for more than 12 weeks.  None of these have have eliminated the pain related to the varicose veins and venous reflux or the discomfort regarding venous congestion.    In addition, behavioral modification including  elevation during the day was again discussed and this will continue.  The patient has utilized over the counter pain medications and has been exercising.  However, at this time conservative therapy has not alleviated the patient's symptoms of leg pain and swelling  Recommend: laser ablation of the left great saphenous veins to eliminate the symptoms of hemorrhage and pain of the left lower extremities caused by the severe superficial venous reflux disease.   2. Chronic venous insufficiency (Primary) Recommend  I have reviewed my previous  discussion with the patient regarding  varicose veins and why they cause symptoms. Patient will continue  wearing graduated compression stockings class 1 on a daily basis, beginning first thing in the morning and removing them in the evening.  The patient is CEAP C3sEpAsPr.  The patient has been wearing compression for more than 12 weeks with no or little benefit.  The patient has been exercising daily for more than 12 weeks. The patient has been elevating and taking OTC pain medications for more than 12 weeks.  None of these have have eliminated the pain related to the varicose veins and venous reflux or the discomfort regarding venous congestion.    In addition, behavioral modification including elevation during the day was again discussed and this will continue.  The patient has utilized over the counter pain medications and has been exercising.  However, at this time conservative therapy has not alleviated the patient's symptoms of leg pain and swelling  Recommend: laser ablation of the left great saphenous veins to eliminate the symptoms of hemorrhage and pain of the left lower extremities caused by the severe superficial venous reflux disease.   3. Essential hypertension Continue antihypertensive medications as already ordered, these medications have been reviewed and there are no changes at this time.  4. Mixed hyperlipidemia Continue statin as  ordered and reviewed, no changes at this time  5. Lumbar spondylosis Continue medications to treat the patient's degenerative disease as already ordered, these medications have been reviewed and there are no changes at this time.  Continued activity and therapy was stressed.    Cordella Shawl, MD  07/28/2024 1:07 PM

## 2024-07-29 ENCOUNTER — Other Ambulatory Visit (INDEPENDENT_AMBULATORY_CARE_PROVIDER_SITE_OTHER): Payer: Self-pay | Admitting: Vascular Surgery

## 2024-07-29 DIAGNOSIS — I83892 Varicose veins of left lower extremities with other complications: Secondary | ICD-10-CM

## 2024-08-01 ENCOUNTER — Ambulatory Visit (INDEPENDENT_AMBULATORY_CARE_PROVIDER_SITE_OTHER)

## 2024-08-01 ENCOUNTER — Ambulatory Visit (INDEPENDENT_AMBULATORY_CARE_PROVIDER_SITE_OTHER): Admitting: Vascular Surgery

## 2024-08-01 ENCOUNTER — Encounter (INDEPENDENT_AMBULATORY_CARE_PROVIDER_SITE_OTHER): Payer: Self-pay | Admitting: Vascular Surgery

## 2024-08-01 VITALS — BP 105/63 | HR 62 | Resp 18 | Wt 186.4 lb

## 2024-08-01 DIAGNOSIS — I10 Essential (primary) hypertension: Secondary | ICD-10-CM | POA: Diagnosis not present

## 2024-08-01 DIAGNOSIS — I872 Venous insufficiency (chronic) (peripheral): Secondary | ICD-10-CM

## 2024-08-01 DIAGNOSIS — I83892 Varicose veins of left lower extremities with other complications: Secondary | ICD-10-CM | POA: Diagnosis not present

## 2024-08-01 DIAGNOSIS — M47816 Spondylosis without myelopathy or radiculopathy, lumbar region: Secondary | ICD-10-CM

## 2024-08-01 DIAGNOSIS — E782 Mixed hyperlipidemia: Secondary | ICD-10-CM | POA: Diagnosis not present

## 2024-08-03 ENCOUNTER — Encounter (INDEPENDENT_AMBULATORY_CARE_PROVIDER_SITE_OTHER): Payer: Self-pay | Admitting: Vascular Surgery

## 2024-08-03 DIAGNOSIS — I83892 Varicose veins of left lower extremities with other complications: Secondary | ICD-10-CM | POA: Insufficient documentation

## 2024-08-31 ENCOUNTER — Telehealth (INDEPENDENT_AMBULATORY_CARE_PROVIDER_SITE_OTHER): Payer: Self-pay | Admitting: Vascular Surgery

## 2024-08-31 ENCOUNTER — Other Ambulatory Visit (INDEPENDENT_AMBULATORY_CARE_PROVIDER_SITE_OTHER): Payer: Self-pay

## 2024-08-31 NOTE — Telephone Encounter (Signed)
 Pt is scheduled for a left leg GSV laser ablation with Dr. Jama on 10.09.25. Pt will need the standard RX protocol called into [facility] in [place]. Thank you

## 2024-08-31 NOTE — Telephone Encounter (Signed)
 done

## 2024-09-01 MED ORDER — ALPRAZOLAM 0.5 MG PO TABS: ORAL_TABLET | ORAL | 0 refills | Status: AC

## 2024-09-21 NOTE — Progress Notes (Unsigned)
    MRN : [id number]  Nicholas Monroe is a 83 y.o. ([birth year]) male who presents with chief complaint of No chief complaint on file. .    The patient's left lower extremity was sterilely prepped and draped.  The ultrasound machine was used to visualize the left great saphenous vein throughout its course.  A segment below the knee was selected for access.  The saphenous vein was accessed without difficulty using ultrasound guidance with a micropuncture needle.   An 0.018  wire was placed beyond the saphenofemoral junction through the sheath and the microneedle was removed.  The 65 cm sheath was then placed over the wire and the wire and dilator were removed.  The laser fiber was placed through the sheath and its tip was placed approximately 2 cm below the saphenofemoral junction.  Tumescent anesthesia was then created with a dilute lidocaine  solution.  Laser energy was then delivered with constant withdrawal of the sheath and laser fiber.  Approximately 2090 Joules of energy were delivered over a length of 42 cm.  Sterile dressings were placed.  The patient tolerated the procedure well without complications.

## 2024-09-22 ENCOUNTER — Encounter (INDEPENDENT_AMBULATORY_CARE_PROVIDER_SITE_OTHER): Payer: Self-pay | Admitting: Vascular Surgery

## 2024-09-22 ENCOUNTER — Ambulatory Visit (INDEPENDENT_AMBULATORY_CARE_PROVIDER_SITE_OTHER): Admitting: Vascular Surgery

## 2024-09-22 VITALS — BP 151/84 | HR 68 | Resp 20 | Wt 181.6 lb

## 2024-09-22 DIAGNOSIS — I83892 Varicose veins of left lower extremities with other complications: Secondary | ICD-10-CM | POA: Diagnosis not present

## 2024-09-22 DIAGNOSIS — I872 Venous insufficiency (chronic) (peripheral): Secondary | ICD-10-CM

## 2024-09-27 ENCOUNTER — Other Ambulatory Visit (INDEPENDENT_AMBULATORY_CARE_PROVIDER_SITE_OTHER): Payer: Self-pay | Admitting: Vascular Surgery

## 2024-09-27 DIAGNOSIS — I872 Venous insufficiency (chronic) (peripheral): Secondary | ICD-10-CM

## 2024-09-29 ENCOUNTER — Ambulatory Visit (INDEPENDENT_AMBULATORY_CARE_PROVIDER_SITE_OTHER)

## 2024-09-29 DIAGNOSIS — I872 Venous insufficiency (chronic) (peripheral): Secondary | ICD-10-CM

## 2024-10-17 ENCOUNTER — Ambulatory Visit (INDEPENDENT_AMBULATORY_CARE_PROVIDER_SITE_OTHER): Admitting: Vascular Surgery

## 2024-10-17 ENCOUNTER — Encounter (INDEPENDENT_AMBULATORY_CARE_PROVIDER_SITE_OTHER): Payer: Self-pay | Admitting: Vascular Surgery

## 2024-10-17 VITALS — BP 138/78 | HR 55 | Resp 18 | Ht 72.0 in | Wt 184.6 lb

## 2024-10-17 DIAGNOSIS — I83892 Varicose veins of left lower extremities with other complications: Secondary | ICD-10-CM | POA: Diagnosis not present

## 2024-10-17 DIAGNOSIS — E782 Mixed hyperlipidemia: Secondary | ICD-10-CM

## 2024-10-17 DIAGNOSIS — I872 Venous insufficiency (chronic) (peripheral): Secondary | ICD-10-CM

## 2024-10-17 DIAGNOSIS — I10 Essential (primary) hypertension: Secondary | ICD-10-CM | POA: Diagnosis not present

## 2024-10-17 NOTE — Progress Notes (Signed)
 MRN : [id number]  Nicholas Monroe is a 83 y.o. (1941/09/06) male who presents with chief complaint of varicose veins hurt.  History of Present Illness:   The patient originally presented after episodes of hemorrhage from varicosities of the left lower extremity.  The patient returns to the office for followup status post laser ablation of the left great saphenous vein on September 23, 2024.  The patient note significant improvement in the lower extremity pain but not resolution of the symptoms. The patient notes multiple residual varicosities bilaterally which continued to hurt with dependent positions and remained tender to palpation. The patient's swelling is minimally from preoperative status. The patient continues to wear graduated compression stockings on a daily basis but these are not eliminating the pain and discomfort. The patient continues to use over-the-counter anti-inflammatory medications to treat the pain and related symptoms but this has not given the patient relief. The patient notes the pain in the lower extremities is causing problems with daily exercise, problems at work and even with household activities such as preparing meals and doing dishes.  The patient is otherwise done well and there have been no complications related to the laser procedure or interval changes in the patient's overall   Post laser ultrasound dated September 29, 2024 shows successful ablation of the left great saphenous vein  No outpatient medications have been marked as taking for the 10/17/24 encounter (Appointment) with Jama, Cordella MATSU, MD.    Past Medical History:  Diagnosis Date   Arthritis    Hyperlipidemia    Hypertension    Ocular myasthenia gravis (HCC)    double vision    Past Surgical History:  Procedure Laterality Date   APPENDECTOMY     COLONOSCOPY  01/2009   normal- Dr Dessa   COLONOSCOPY WITH PROPOFOL  N/A 11/03/2018   Procedure: COLONOSCOPY WITH PROPOFOL ;   Surgeon: Dessa Reyes ORN, MD;  Location: [facility];  Service: Endoscopy;  Laterality: N/A;   EYE SURGERY Bilateral    cataracts   HERNIA REPAIR     TOTAL HIP ARTHROPLASTY Right 07/06/2015   Procedure: TOTAL HIP ARTHROPLASTY;  Surgeon: Norleen JINNY Maltos, MD;  Location: [facility];  Service: Orthopedics;  Laterality: Right;   TOTAL HIP ARTHROPLASTY Left 03/06/2020   Procedure: TOTAL HIP ARTHROPLASTY;  Surgeon: Maltos Norleen JINNY, MD;  Location: [facility];  Service: Orthopedics;  Laterality: Left;   TOTAL KNEE ARTHROPLASTY Left 10/20/2019   Procedure: TOTAL KNEE ARTHROPLASTY;  Surgeon: Maltos Norleen JINNY, MD;  Location: [facility];  Service: Orthopedics;  Laterality: Left;    Social History Social History   Tobacco Use   Smoking status: Never   Smokeless tobacco: Never   Tobacco comments:    smoking cessation materials not required  Vaping Use   Vaping status: Never Used  Substance Use Topics   Alcohol use: Not Currently   Drug use: No    Family History Family History  Problem Relation Age of Onset   Healthy Father    Diabetes Brother    Hypertension Mother    Colon cancer Mother 54   Diabetes Daughter        type 1   Diabetes Brother    Stroke Brother     No Known Allergies   REVIEW OF SYSTEMS (Negative unless checked)  Constitutional: [] Weight loss  [] Fever  [] Chills Cardiac: [] Chest pain   [] Chest pressure   [] Palpitations   [] Shortness of  breath when laying flat   [] Shortness of breath with exertion. Vascular:  [] Pain in legs with walking   [x] Pain in legs with standing  [] History of DVT   [] Phlebitis   [] Swelling in legs   [x] Varicose veins   [] Non-healing ulcers Pulmonary:   [] Uses home oxygen   [] Productive cough   [] Hemoptysis   [] Wheeze  [] COPD   [] Asthma Neurologic:  [] Dizziness   [] Seizures   [] History of stroke   [] History of TIA  [] Aphasia   [] Vissual changes   [] Weakness or numbness in arm   [] Weakness or numbness in leg Musculoskeletal:   [] Joint swelling   [] Joint pain    [] Low back pain Hematologic:  [] Easy bruising  [] Easy bleeding   [] Hypercoagulable state   [] Anemic Gastrointestinal:  [] Diarrhea   [] Vomiting  [] Gastroesophageal reflux/heartburn   [] Difficulty swallowing. Genitourinary:  [] Chronic kidney disease   [] Difficult urination  [] Frequent urination   [] Blood in urine Skin:  [] Rashes   [] Ulcers  Psychological:  [] History of anxiety   []  History of major depression.  Physical Examination  There were no vitals filed for this visit. There is no height or weight on file to calculate BMI. Gen: WD/WN, NAD Head: [place]/AT, No temporalis wasting.  Ear/Nose/Throat: Hearing grossly intact, nares w/o erythema or drainage, pinna without lesions Eyes: PER, EOMI, sclera nonicteric.  Neck: Supple, no gross masses.  No JVD.  Pulmonary:  Good air movement, no audible wheezing, no use of accessory muscles.  Cardiac: RRR, precordium not hyperdynamic. Vascular:  Large varicosities present, greater than 10 mm left lower extremity.  Veins are tender to palpation  Mild venous stasis changes to the legs bilaterally.  Trace soft pitting edema CEAP C3sEpAsPr Vessel Right Left  Radial Palpable Palpable  Gastrointestinal: soft, non-distended. No guarding/no peritoneal signs.  Musculoskeletal: M/S 5/5 throughout.  No deformity.  Neurologic: CN 2-12 intact. Pain and light touch intact in extremities.  Symmetrical.  Speech is fluent. Motor exam as listed above. Psychiatric: Judgment intact, Mood & affect appropriate for pt's clinical situation. Dermatologic: Venous rashes no ulcers noted.  No changes consistent with cellulitis. Lymph : No lichenification or skin changes of chronic lymphedema.  CBC Lab Results  Component Value Date   WBC 3.8 (L) 05/31/2024   HGB 10.3 (L) 05/31/2024   HCT 30.6 (L) 05/31/2024   MCV 98.4 05/31/2024   PLT 126 (L) 05/31/2024    BMET    Component Value Date/Time   NA 139 05/31/2024 0813   NA 140 07/14/2023 0849   K 3.5 05/31/2024 0813    CL 109 05/31/2024 0813   CO2 23 05/31/2024 0813   GLUCOSE 138 (H) 05/31/2024 0813   BUN 21 05/31/2024 0813   BUN 18 07/14/2023 0849   CREATININE 1.05 05/31/2024 0813   CREATININE 1.03 05/13/2013 1135   CALCIUM 8.2 (L) 05/31/2024 0813   GFRNONAA >60 05/31/2024 0813   GFRNONAA >60 05/13/2013 1135   GFRAA 71 01/01/2021 1159   GFRAA >60 05/13/2013 1135   CrCl cannot be calculated (Patient's most recent lab result is older than the maximum 21 days allowed.).  COAG No results found for: INR, PROTIME  Radiology VAS US  LOWER EXTREMITY VENOUS POST ABLATION Result Date: 10/03/2024  Lower Venous Reflux Study Patient Name:  MATEUS REWERTS  Date of Exam:   09/29/2024 Medical Rec #: [id number]          Accession #:    [id number] Date of Birth: 1941/12/08  Patient Gender: M Patient Age:   56 years Exam Location:  [facility] Procedure:      VAS US  LOWER EXTREMITY VENOUS POST ABLATION Referring Phys: Asuna Peth --------------------------------------------------------------------------------  Indications: S/p left gsv ablation.  Performing Technologist: Jerel Croak RVT  Examination Guidelines: A complete evaluation includes B-mode imaging, spectral Doppler, color Doppler, and power Doppler as needed of all accessible portions of each vessel. Bilateral testing is considered an integral part of a complete examination. Limited examinations for reoccurring indications may be performed as noted. The reflux portion of the exam is performed with the patient in reverse Trendelenburg. Significant venous reflux is defined as >500 ms in the superficial venous system, and >1 second in the deep venous system.   Summary: Left: - No evidence of deep vein thrombosis seen in the left lower extremity, from the common femoral through the popliteal veins. - No evidence of superficial venous thrombosis in the left lower extremity. - Successful closure of left GSV to within .7cm of SFJ  *See  table(s) above for measurements and observations. Electronically signed by Cordella Shawl MD on 10/03/2024 at 11:15:24 AM.    Final      Assessment/Plan 1. 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  Patient should undergo injection sclerotherapy to treat the residual varicosities.  The risks, benefits and alternative therapies were reviewed in detail with the patient.  All questions were answered.  The patient agrees to proceed with sclerotherapy at their convenience.  The patient will continue wearing the graduated compression stockings and using the over-the-counter pain medications to treat her symptoms.   2. Chronic venous insufficiency No surgery or intervention at this point in time.   The patient is CEAP C4sEpAsPr   I have discussed with the patient venous insufficiency and why it  causes symptoms. I have discussed with the patient the chronic skin changes that accompany venous insufficiency and the long term sequela such as infection and ulceration.  Patient will begin wearing graduated compression stockings or compression wraps on a daily basis.  The patient will put the compression on first thing in the morning and removing them in the evening. The patient is instructed specifically not to sleep in the compression.    In addition, behavioral modification including several periods of elevation of the lower extremities during the day will be continued. I have demonstrated that proper elevation is a position with the ankles at heart level.  The patient is instructed to begin routine exercise, especially walking on a daily basis  3. Essential hypertension Continue antihypertensive medications as already ordered, these medications have  been reviewed and there are no changes at this time.  4. Mixed hyperlipidemia Continue statin as ordered and reviewed, no changes at this time    Cordella Shawl, MD  10/17/2024 8:36 AM

## 2024-11-14 ENCOUNTER — Telehealth (INDEPENDENT_AMBULATORY_CARE_PROVIDER_SITE_OTHER): Payer: Self-pay | Admitting: Vascular Surgery

## 2024-11-14 NOTE — Telephone Encounter (Signed)
 Called pt 2x on home and mobile phone to schedule sclero LVM for both      Left leg FOAM sclero see GS. Auth # [id number]. Exp.- 12/14/24.- 3 units total

## 2024-11-28 ENCOUNTER — Ambulatory Visit (INDEPENDENT_AMBULATORY_CARE_PROVIDER_SITE_OTHER): Admitting: Vascular Surgery

## 2024-11-28 ENCOUNTER — Encounter (INDEPENDENT_AMBULATORY_CARE_PROVIDER_SITE_OTHER): Payer: Self-pay | Admitting: Vascular Surgery

## 2024-11-28 VITALS — BP 139/72 | HR 54 | Resp 17 | Ht 72.0 in | Wt 186.0 lb

## 2024-11-28 DIAGNOSIS — I83892 Varicose veins of left lower extremities with other complications: Secondary | ICD-10-CM

## 2024-11-28 NOTE — Progress Notes (Unsigned)
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  Procedure:  Sclerotherapy using both STS and hypertonic saline mixed with 1% Lidocaine  was performed on the left lower extremity.  Compression wraps were placed.  The patient tolerated the procedure well.  Plan:  Follow up as needed.

## 2024-11-29 ENCOUNTER — Encounter (INDEPENDENT_AMBULATORY_CARE_PROVIDER_SITE_OTHER): Payer: Self-pay | Admitting: Vascular Surgery
